# Patient Record
Sex: Female | Born: 1940 | Race: Black or African American | Hispanic: No | Smoking: Former smoker
Health system: Southern US, Community
[De-identification: ages and names within clinical notes are randomized; demographics above are authoritative.]

## PROBLEM LIST (undated history)

## (undated) DIAGNOSIS — I10 Essential (primary) hypertension: Secondary | ICD-10-CM

## (undated) DIAGNOSIS — M48061 Spinal stenosis, lumbar region without neurogenic claudication: Secondary | ICD-10-CM

## (undated) DIAGNOSIS — G5603 Carpal tunnel syndrome, bilateral upper limbs: Secondary | ICD-10-CM

## (undated) DIAGNOSIS — K56609 Unspecified intestinal obstruction, unspecified as to partial versus complete obstruction: Secondary | ICD-10-CM

## (undated) DIAGNOSIS — Q613 Polycystic kidney, unspecified: Secondary | ICD-10-CM

## (undated) DIAGNOSIS — Z9889 Other specified postprocedural states: Secondary | ICD-10-CM

## (undated) DIAGNOSIS — A6000 Herpesviral infection of urogenital system, unspecified: Secondary | ICD-10-CM

## (undated) DIAGNOSIS — M8580 Other specified disorders of bone density and structure, unspecified site: Secondary | ICD-10-CM

## (undated) DIAGNOSIS — I6529 Occlusion and stenosis of unspecified carotid artery: Secondary | ICD-10-CM

## (undated) DIAGNOSIS — R112 Nausea with vomiting, unspecified: Secondary | ICD-10-CM

## (undated) HISTORY — DX: Herpesviral infection of urogenital system, unspecified: A60.00

## (undated) HISTORY — DX: Unspecified intestinal obstruction, unspecified as to partial versus complete obstruction: K56.609

## (undated) HISTORY — PX: SPINE SURGERY: SHX786

## (undated) HISTORY — PX: APPENDECTOMY: SHX54

## (undated) HISTORY — PX: CARPAL TUNNEL RELEASE: SHX101

## (undated) HISTORY — DX: Occlusion and stenosis of unspecified carotid artery: I65.29

## (undated) HISTORY — DX: Carpal tunnel syndrome, bilateral upper limbs: G56.03

## (undated) HISTORY — DX: Polycystic kidney, unspecified: Q61.3

## (undated) HISTORY — DX: Essential (primary) hypertension: I10

## (undated) HISTORY — DX: Other specified disorders of bone density and structure, unspecified site: M85.80

## (undated) HISTORY — PX: BREAST SURGERY: SHX581

## (undated) HISTORY — PX: SMALL INTESTINE SURGERY: SHX150

## (undated) HISTORY — DX: Spinal stenosis, lumbar region without neurogenic claudication: M48.061

---

## 2005-10-15 LAB — HM COLONOSCOPY: HM Colonoscopy: NORMAL

## 2008-05-17 LAB — CONVERTED CEMR LAB: Pap Smear: NORMAL

## 2008-08-24 ENCOUNTER — Ambulatory Visit: Payer: Self-pay | Admitting: *Deleted

## 2008-08-24 DIAGNOSIS — G5600 Carpal tunnel syndrome, unspecified upper limb: Secondary | ICD-10-CM | POA: Insufficient documentation

## 2008-08-24 DIAGNOSIS — I10 Essential (primary) hypertension: Secondary | ICD-10-CM

## 2008-08-24 LAB — CONVERTED CEMR LAB
CO2: 29 meq/L (ref 19–32)
Chloride: 105 meq/L (ref 96–112)
GFR calc non Af Amer: 66 mL/min
Potassium: 4.6 meq/L (ref 3.5–5.1)
Sodium: 141 meq/L (ref 135–145)

## 2008-09-25 ENCOUNTER — Ambulatory Visit: Payer: Self-pay | Admitting: *Deleted

## 2008-09-25 DIAGNOSIS — E669 Obesity, unspecified: Secondary | ICD-10-CM

## 2008-09-25 DIAGNOSIS — E119 Type 2 diabetes mellitus without complications: Secondary | ICD-10-CM

## 2008-09-26 ENCOUNTER — Telehealth (INDEPENDENT_AMBULATORY_CARE_PROVIDER_SITE_OTHER): Payer: Self-pay | Admitting: *Deleted

## 2008-09-27 ENCOUNTER — Ambulatory Visit: Payer: Self-pay | Admitting: *Deleted

## 2008-09-28 ENCOUNTER — Telehealth (INDEPENDENT_AMBULATORY_CARE_PROVIDER_SITE_OTHER): Payer: Self-pay | Admitting: *Deleted

## 2008-10-02 ENCOUNTER — Telehealth (INDEPENDENT_AMBULATORY_CARE_PROVIDER_SITE_OTHER): Payer: Self-pay | Admitting: *Deleted

## 2008-10-04 ENCOUNTER — Ambulatory Visit: Payer: Self-pay | Admitting: *Deleted

## 2008-10-11 ENCOUNTER — Encounter (INDEPENDENT_AMBULATORY_CARE_PROVIDER_SITE_OTHER): Payer: Self-pay | Admitting: *Deleted

## 2008-10-16 ENCOUNTER — Encounter: Admission: RE | Admit: 2008-10-16 | Discharge: 2008-10-16 | Payer: Self-pay | Admitting: *Deleted

## 2008-10-18 ENCOUNTER — Encounter (INDEPENDENT_AMBULATORY_CARE_PROVIDER_SITE_OTHER): Payer: Self-pay | Admitting: *Deleted

## 2008-10-23 ENCOUNTER — Ambulatory Visit: Payer: Self-pay | Admitting: *Deleted

## 2008-10-26 ENCOUNTER — Encounter (INDEPENDENT_AMBULATORY_CARE_PROVIDER_SITE_OTHER): Payer: Self-pay | Admitting: *Deleted

## 2008-12-19 ENCOUNTER — Telehealth: Payer: Self-pay | Admitting: Internal Medicine

## 2008-12-19 ENCOUNTER — Ambulatory Visit: Payer: Self-pay | Admitting: Internal Medicine

## 2008-12-19 LAB — CONVERTED CEMR LAB
Albumin: 4 g/dL (ref 3.5–5.2)
Cholesterol: 200 mg/dL (ref 0–200)
Creatinine,U: 220.5 mg/dL
LDL Cholesterol: 122 mg/dL — ABNORMAL HIGH (ref 0–99)
Microalb Creat Ratio: 10 mg/g (ref 0.0–30.0)
Microalb, Ur: 2.2 mg/dL — ABNORMAL HIGH (ref 0.0–1.9)
TSH: 1.17 microintl units/mL (ref 0.35–5.50)
Total Bilirubin: 0.8 mg/dL (ref 0.3–1.2)
Triglycerides: 118 mg/dL (ref 0.0–149.0)
VLDL: 23.6 mg/dL (ref 0.0–40.0)

## 2008-12-20 ENCOUNTER — Encounter: Payer: Self-pay | Admitting: Internal Medicine

## 2008-12-24 ENCOUNTER — Telehealth: Payer: Self-pay | Admitting: Internal Medicine

## 2008-12-26 ENCOUNTER — Encounter: Payer: Self-pay | Admitting: Internal Medicine

## 2009-01-02 ENCOUNTER — Encounter: Payer: Self-pay | Admitting: Internal Medicine

## 2009-01-30 ENCOUNTER — Encounter: Payer: Self-pay | Admitting: Internal Medicine

## 2009-05-21 ENCOUNTER — Telehealth: Payer: Self-pay | Admitting: Internal Medicine

## 2009-05-22 ENCOUNTER — Ambulatory Visit: Payer: Self-pay | Admitting: Internal Medicine

## 2009-05-22 LAB — CONVERTED CEMR LAB
BUN: 30 mg/dL — ABNORMAL HIGH (ref 6–23)
Chloride: 101 meq/L (ref 96–112)
Cholesterol: 176 mg/dL (ref 0–200)
Glucose, Bld: 160 mg/dL — ABNORMAL HIGH (ref 70–99)
LDL Cholesterol: 99 mg/dL (ref 0–99)
Potassium: 4.7 meq/L (ref 3.5–5.3)
Sodium: 136 meq/L (ref 135–145)
Total CHOL/HDL Ratio: 3.1
Triglycerides: 105 mg/dL (ref <150)
VLDL: 21 mg/dL (ref 0–40)

## 2009-05-22 LAB — HM DIABETES FOOT EXAM

## 2009-05-23 ENCOUNTER — Telehealth: Payer: Self-pay | Admitting: Internal Medicine

## 2009-05-31 ENCOUNTER — Ambulatory Visit: Payer: Self-pay | Admitting: Internal Medicine

## 2009-05-31 LAB — CONVERTED CEMR LAB
BUN: 31 mg/dL — ABNORMAL HIGH (ref 6–23)
Calcium: 9.5 mg/dL (ref 8.4–10.5)
Glucose, Bld: 131 mg/dL — ABNORMAL HIGH (ref 70–99)
Potassium: 4.3 meq/L (ref 3.5–5.3)

## 2009-06-01 ENCOUNTER — Telehealth: Payer: Self-pay | Admitting: Internal Medicine

## 2009-06-26 ENCOUNTER — Ambulatory Visit (HOSPITAL_BASED_OUTPATIENT_CLINIC_OR_DEPARTMENT_OTHER): Admission: RE | Admit: 2009-06-26 | Discharge: 2009-06-26 | Payer: Self-pay | Admitting: Internal Medicine

## 2009-06-26 ENCOUNTER — Ambulatory Visit: Payer: Self-pay | Admitting: Diagnostic Radiology

## 2009-06-26 ENCOUNTER — Ambulatory Visit: Payer: Self-pay | Admitting: Internal Medicine

## 2009-06-26 DIAGNOSIS — N182 Chronic kidney disease, stage 2 (mild): Secondary | ICD-10-CM | POA: Insufficient documentation

## 2009-07-25 ENCOUNTER — Encounter: Payer: Self-pay | Admitting: Internal Medicine

## 2009-08-16 ENCOUNTER — Ambulatory Visit: Payer: Self-pay | Admitting: Internal Medicine

## 2009-08-16 DIAGNOSIS — Z87891 Personal history of nicotine dependence: Secondary | ICD-10-CM

## 2009-08-16 DIAGNOSIS — E785 Hyperlipidemia, unspecified: Secondary | ICD-10-CM

## 2009-08-27 ENCOUNTER — Ambulatory Visit: Payer: Self-pay

## 2009-08-27 ENCOUNTER — Encounter: Payer: Self-pay | Admitting: Cardiovascular Disease

## 2009-08-27 ENCOUNTER — Encounter: Payer: Self-pay | Admitting: Internal Medicine

## 2009-10-08 ENCOUNTER — Telehealth: Payer: Self-pay | Admitting: Internal Medicine

## 2009-10-11 ENCOUNTER — Ambulatory Visit: Payer: Self-pay | Admitting: Internal Medicine

## 2009-10-11 DIAGNOSIS — Q613 Polycystic kidney, unspecified: Secondary | ICD-10-CM

## 2009-10-11 DIAGNOSIS — J209 Acute bronchitis, unspecified: Secondary | ICD-10-CM

## 2009-10-11 HISTORY — DX: Polycystic kidney, unspecified: Q61.3

## 2009-10-11 LAB — CONVERTED CEMR LAB
Inflenza A Ag: NEGATIVE
Influenza B Ag: NEGATIVE

## 2009-10-19 ENCOUNTER — Ambulatory Visit: Payer: Self-pay | Admitting: Internal Medicine

## 2009-11-21 ENCOUNTER — Telehealth: Payer: Self-pay | Admitting: Internal Medicine

## 2010-01-01 ENCOUNTER — Encounter: Payer: Self-pay | Admitting: Internal Medicine

## 2010-01-08 ENCOUNTER — Encounter: Payer: Self-pay | Admitting: Internal Medicine

## 2010-01-08 ENCOUNTER — Ambulatory Visit (HOSPITAL_COMMUNITY): Admission: RE | Admit: 2010-01-08 | Discharge: 2010-01-08 | Payer: Self-pay | Admitting: Nephrology

## 2010-02-06 ENCOUNTER — Encounter: Payer: Self-pay | Admitting: Internal Medicine

## 2010-02-06 LAB — HM DIABETES EYE EXAM: HM Diabetic Eye Exam: NORMAL

## 2010-02-15 ENCOUNTER — Encounter: Payer: Self-pay | Admitting: Internal Medicine

## 2010-02-20 ENCOUNTER — Encounter: Admission: RE | Admit: 2010-02-20 | Discharge: 2010-02-20 | Payer: Self-pay | Admitting: Nephrology

## 2010-03-05 ENCOUNTER — Ambulatory Visit: Payer: Self-pay | Admitting: Internal Medicine

## 2010-03-05 LAB — CONVERTED CEMR LAB
BUN: 42 mg/dL — ABNORMAL HIGH (ref 6–23)
CO2: 23 meq/L (ref 19–32)
Calcium: 9.5 mg/dL (ref 8.4–10.5)
Chloride: 106 meq/L (ref 96–112)
Creatinine, Ser: 1.55 mg/dL — ABNORMAL HIGH (ref 0.40–1.20)
Glucose, Bld: 113 mg/dL — ABNORMAL HIGH (ref 70–99)
Microalb Creat Ratio: 7.7 mg/g (ref 0.0–30.0)

## 2010-03-06 ENCOUNTER — Telehealth: Payer: Self-pay | Admitting: Internal Medicine

## 2010-03-07 ENCOUNTER — Encounter (INDEPENDENT_AMBULATORY_CARE_PROVIDER_SITE_OTHER): Payer: Self-pay | Admitting: *Deleted

## 2010-03-22 ENCOUNTER — Encounter: Payer: Self-pay | Admitting: Internal Medicine

## 2010-04-03 ENCOUNTER — Encounter: Payer: Self-pay | Admitting: Internal Medicine

## 2010-04-04 ENCOUNTER — Telehealth: Payer: Self-pay | Admitting: Internal Medicine

## 2010-05-08 ENCOUNTER — Other Ambulatory Visit: Admission: RE | Admit: 2010-05-08 | Discharge: 2010-05-08 | Payer: Self-pay | Admitting: Internal Medicine

## 2010-05-08 ENCOUNTER — Ambulatory Visit: Payer: Self-pay | Admitting: Family

## 2010-05-09 ENCOUNTER — Encounter: Payer: Self-pay | Admitting: Internal Medicine

## 2010-05-10 ENCOUNTER — Telehealth: Payer: Self-pay | Admitting: Internal Medicine

## 2010-05-10 ENCOUNTER — Encounter: Payer: Self-pay | Admitting: Family

## 2010-05-14 ENCOUNTER — Encounter: Payer: Self-pay | Admitting: Internal Medicine

## 2010-06-10 ENCOUNTER — Encounter: Payer: Self-pay | Admitting: Internal Medicine

## 2010-06-10 LAB — CONVERTED CEMR LAB
ALT: 17 units/L (ref 0–35)
AST: 20 units/L (ref 0–37)
Albumin: 4.2 g/dL (ref 3.5–5.2)
Alkaline Phosphatase: 60 units/L (ref 39–117)
BUN: 35 mg/dL — ABNORMAL HIGH (ref 6–23)
Bilirubin, Direct: 0.1 mg/dL (ref 0.0–0.3)
CO2: 23 meq/L (ref 19–32)
Calcium: 9.3 mg/dL (ref 8.4–10.5)
Chloride: 106 meq/L (ref 96–112)
Cholesterol: 174 mg/dL (ref 0–200)
Creatinine, Ser: 1.38 mg/dL — ABNORMAL HIGH (ref 0.40–1.20)
Glucose, Bld: 97 mg/dL (ref 70–99)
HDL: 72 mg/dL (ref >39)
Hgb A1c MFr Bld: 6.1 % — ABNORMAL HIGH (ref <5.7)
Indirect Bilirubin: 0.4 mg/dL (ref 0.0–0.9)
LDL Cholesterol: 85 mg/dL (ref 0–99)
Potassium: 4.5 meq/L (ref 3.5–5.3)
Sodium: 140 meq/L (ref 135–145)
Total Bilirubin: 0.5 mg/dL (ref 0.3–1.2)
Total CHOL/HDL Ratio: 2.4
Total Protein: 7.4 g/dL (ref 6.0–8.3)
Triglycerides: 86 mg/dL (ref <150)
VLDL: 17 mg/dL (ref 0–40)

## 2010-06-18 ENCOUNTER — Ambulatory Visit: Payer: Self-pay | Admitting: Internal Medicine

## 2010-06-20 ENCOUNTER — Ambulatory Visit (HOSPITAL_COMMUNITY): Admission: RE | Admit: 2010-06-20 | Discharge: 2010-06-20 | Payer: Self-pay | Admitting: Orthopaedic Surgery

## 2010-06-27 ENCOUNTER — Ambulatory Visit: Payer: Self-pay | Admitting: Diagnostic Radiology

## 2010-06-27 ENCOUNTER — Ambulatory Visit (HOSPITAL_BASED_OUTPATIENT_CLINIC_OR_DEPARTMENT_OTHER): Admission: RE | Admit: 2010-06-27 | Discharge: 2010-06-27 | Payer: Self-pay | Admitting: Internal Medicine

## 2010-09-17 NOTE — Letter (Signed)
   [place] at [facility] [address] [place], [place]  [address]  [place] Phone: [phone]      May 10, 2010   [place] [address] [place], [place] [address]  RE:  LAB RESULTS  Dear  Ms. Searles,  The following is an interpretation of your most recent lab tests.  Please take note of any instructions provided or changes to medications that have resulted from your lab work.  Pap Smear: normal      Sincerely Yours,    Lemont Fillers FNP  Appended Document:  mailed

## 2010-09-17 NOTE — Assessment & Plan Note (Signed)
Summary: 4 month follow up/mhjf   Vital Signs:  Patient profile:   70 year old female Height:      59 inches Weight:      125.25 pounds BMI:     25.39 O2 Sat:      100 % on Room air Temp:     97.7 degrees F oral Pulse rate:   71 / minute Pulse rhythm:   regular Resp:     18 per minute BP sitting:   140 / 80  (right arm) Cuff size:   regular  Vitals Entered By: Glendell Docker CMA (June 18, 2010 10:19 AM)  O2 Flow:  Room air  Contraindications/Deferment of Procedures/Staging:    Test/Procedure: FLU VAX    Reason for deferment: patient declined  CC: 4 month follow  Is Patient Diabetic? Yes Did you bring your meter with you today? No Pain Assessment Patient in pain? no      Comments low blood sugar 107 high 115 avg  107-109   Primary Care Mon[place]:  Dondra Spry DO  CC:  4 month follow .  History of Present Illness: 70 y/o AA female for f/u  htn - no dizziness,  no chest pain,  good med compliance DM II - stable  CTS - seen by Dr. Clarita Crane.   NCS confirmed CTS.  Rt hand worse than left. Ins co has not approved surgery    Preventive Screening-Counseling & Management  Alcohol-Tobacco     Smoking Status: quit  Allergies: 1)  ! Ibuprofen 2)  ! 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   Neck:  No deformities, masses, or tenderness noted. Lungs:  Normal respiratory effort, chest expands symmetrically. Lungs are clear to auscultation, no crackles  or wheezes. Heart:  Normal rate and regular rhythm. S1 and S2 normal without gallop, murmur, click, rub or other extra sounds. Extremities:  No lower extremity edema    Impression & Recommendations:  Problem # 1:  DIABETES MELLITUS, TYPE II (ICD-250.00) Assessment Unchanged  Her updated medication list for this problem includes:    Losartan Potassium 50 Mg Tabs (Losartan potassium) ..... One by mouth once daily  Labs Reviewed: Creat: 1.38 (06/10/2010)     Last Eye Exam: normal (02/06/2010) Reviewed HgBA1c results: 6.1 (06/10/2010)  6.3 (03/05/2010)  Problem # 2:  HYPERTENSION, BENIGN ESSENTIAL (ICD-401.1) Assessment: Unchanged bp suboptimal.  add amlodipine  Her updated medication list for this problem includes:    Losartan Potassium 50 Mg Tabs (Losartan potassium) ..... One by mouth once daily    Amlodipine Besylate 2.5 Mg Tabs (Amlodipine besylate) ..... One by mouth once daily  BP today: 140/80 Prior BP: 142/94 (05/08/2010)  Labs Reviewed: K+: 4.5 (06/10/2010) Creat: : 1.38 (06/10/2010)   Chol: 174 (06/10/2010)   HDL: 72 (06/10/2010)   LDL: 85 (06/10/2010)   TG: 86 (06/10/2010)  Problem # 3:  HYPERLIPIDEMIA (ICD-272.4) Assessment:  Unchanged  Her updated medication list for this problem includes:    Pravastatin Sodium 40 Mg Tabs (Pravastatin sodium) ..... One by mouth at bedtime  Labs Reviewed: SGOT: 20 (06/10/2010)   SGPT: 17 (06/10/2010)   HDL:72 (06/10/2010), 56 (05/22/2009)  LDL:85 (06/10/2010), 99 (05/22/2009)  Chol:174 (06/10/2010), 176 (05/22/2009)  Trig:86 (06/10/2010), 105 (05/22/2009)  Complete Medication List: 1)  Losartan Potassium 50 Mg Tabs (Losartan potassium) .... One by mouth once daily 2)  Pravastatin Sodium 40 Mg Tabs (Pravastatin sodium) .... One by mouth at bedtime 3)  Nystop 100000 Unit/gm Powd (Nystatin) .... Apply twice daily beneath breasts until rash is resolved 4)  Amlodipine Besylate 2.5 Mg Tabs (Amlodipine besylate) .... One by mouth  once daily  Patient Instructions: 1)  Please schedule a follow-up appointment in 2 months. Prescriptions: PRAVASTATIN SODIUM 40 MG TABS (PRAVASTATIN SODIUM) one by mouth at bedtime  #90 x 1   Entered and Authorized by:   D. Thomos Lemons DO   Signed by:   D. Thomos Lemons DO on 06/18/2010   Method used:   Electronically to        [facility] [address] (retail)       [phone]       [place], [place]  [address]       Ph: [phone]       Fax: [phone]   RxID:   [id number] LOSARTAN POTASSIUM 50 MG TABS (LOSARTAN POTASSIUM) one by mouth once daily  #90 x 1   Entered and Authorized by:   D. Thomos Lemons DO   Signed by:   D. Thomos Lemons DO on 06/18/2010   Method used:   Electronically to        [facility] [address] [place] (retail)       [phone]       [place], [place]  [address]       Ph: [phone]       Fax: [phone]   RxID:   [id number] AMLODIPINE BESYLATE 2.5 MG TABS (AMLODIPINE BESYLATE) one by mouth once daily  #30 x 3   Entered and Authorized by:   D. Thomos Lemons DO   Signed by:   D. Thomos Lemons DO on 06/18/2010   Method used:   Electronically to        [facility] [address] [place] (retail)       [phone]       [place], [place]  [address]       Ph: [phone]       Fax: [phone]   RxID:   [id number]    Orders Added: 1)  Est. Patient Level III [03500]   Immunization History:  Influenza Immunization History:    Influenza:  declined (06/18/2010)   Immunization History:  Influenza Immunization History:    Influenza:  Declined (06/18/2010)  Current Allergies (reviewed today): ! IBUPROFEN ! LISINOPRIL

## 2010-09-17 NOTE — Consult Note (Signed)
Summary: [facility]  [facility]   Imported By: Lanelle Bal 04/12/2010 10:27:19  _____________________________________________________________________  External Attachment:    Type:   Image     Comment:   External Document

## 2010-09-17 NOTE — Letter (Signed)
Summary: Primary Care Consult Scheduled Letter  [place] at [facility]  [address]   [place], [place] [address]   Phone: [phone]  Fax: [phone]      03/07/2010 MRN: [id number]  [facility] [address] [place], [place]  [address]    Dear Ms. Kapral,      We have scheduled an appointment for you.  At the recommendation of Dr.YOO, we have scheduled you a consult with [facility] , DR Melvyn Novas  on AUGUST 9,2011 at 2:30PM .  Their address is_[address] . The office phone number is [phone].  If this appointment day and time is not convenient for you, please feel free to call the office of the doctor you are being referred to at the number listed above and reschedule the appointment.     It is important for you to keep your scheduled appointments. We are here to make sure you are given good patient care.     Thank you,  Darral Dash Patient Care Coordinator [place] at [facility]

## 2010-09-17 NOTE — Progress Notes (Signed)
Summary: Nerve conductive study  Phone Note Call from Patient Call back at Home Phone [phone] Call back at [phone]   Caller: Patient Summary of Call: Pt wants to know if we know of a physician that can do a nerve conductive study that accepts her insurance ([employer]), Dr Jenetta Downer office recommended a office in [place] that does not accept her insurance Initial call taken by: Lannette Donath,  April 04, 2010 10:13 AM  Follow-up for Phone Call        Hold to schedule  for Ins  precert    Pt is aware  Follow-up by: Darral Dash,  April 15, 2010 11:18 AM

## 2010-09-17 NOTE — Assessment & Plan Note (Signed)
Summary: pap/mhf--Rm 5   Vital Signs:  Patient profile:   70 year old female LMP:     08/18/1989 Height:      59 inches Weight:      121.50 pounds BMI:     24.63 Temp:     98.1 degrees F oral Pulse rate:   90 / minute Pulse rhythm:   regular Resp:     16 per minute BP sitting:   142 / 94  (right arm) Cuff size:   regular  Vitals Entered By: Mervin Kung CMA Duncan Dull) (May 08, 2010 2:28 PM) CC: Rm 5  Pt here for pap smear. Is Patient Diabetic? Yes Comments Pt states she has been out of Pravastatin for some time and needs refill. Nicki Guadalajara Fergerson CMA Duncan Dull)  May 08, 2010 2:32 PM  LMP (date): 08/18/1989 LMP - Character: menopausal Menarche (age onset years): 12   Menses interval (days): 28 Menstrual flow (days): 4 Enter LMP: 08/18/1989 Last PAP Result Normal   Primary Care Provider:  Dondra Spry DO  CC:  Rm 5  Pt here for pap smear.Marland Kitchen  History of Present Illness: Ms Muntean is 70 year old female who presents today for her screening Pap.  She reports that she has always had normal Pap smears.  Last Pap was in 2009.  Her last mammogram was performed in November 2010.  Pt reports 3 preganancies, 3 live term births.  Currently menopausal.  Not currently sexually active.  Has been menopausal since 1991.  Allergies: 1)  ! Ibuprofen 2)  ! Lisinopril  Past History:  Past Medical History: Last updated: 03/05/2010 carpal tunnel Hypertension  lumbar spinal stenosis      Diabetes mellitus, type II     Physical Exam  General:  Petite, african [place] female, awake, alert and in NAD Head:  Normocephalic and atraumatic without obvious abnormalities. No apparent alopecia or balding. Neck:  No deformities, masses, or tenderness noted. Breasts:  Dense breasts, but no discrete masses noted.  Lungs:  Normal respiratory effort, chest expands symmetrically. Lungs are clear to auscultation, no crackles or wheezes. Heart:  Normal rate and regular rhythm. S1 and S2 normal  without gallop, murmur, click, rub or other extra sounds. Abdomen:  Bowel sounds positive,abdomen soft and non-tender without masses, organomegaly or hernias noted. Genitalia:  Pelvic Exam:        External: normal female genitalia without lesions or masses        Vagina: normal without lesions or masses        Cervix: normal without lesions or masses        Adnexa: normal bimanual exam without masses or fullness        Uterus: normal by palpation        Pap smear: performed   Impression & Recommendations:  Problem # 1:  ROUTINE GYNECOLOGICAL EXAMINATION (ICD-V72.31) Assessment Comment Only Pap performed today.  Will need mammogram in November- wants to wait to schedule.  Patient has a follow up appointment with Dr. Artist Pais in November.    Complete Medication List: 1)  Cozaar 50 Mg Tabs (Losartan potassium) .... One by mouth qd 2)  Pravastatin Sodium 40 Mg Tabs (Pravastatin sodium) .... One by mouth at bedtime  Patient Instructions: 1)  Please go to lab fasting 1 week before your upcoming appointment with Dr. Artist Pais to complete your lab work. 2)  We will mail you the results of your Pap smear. 3)  You will need to schedule a mammogram in November-  please call us when you are ready to schedule this.   Current Allergies (reviewed today): ! IBUPROFEN ! LISINOPRIL

## 2010-09-17 NOTE — Assessment & Plan Note (Signed)
Summary: check hand/mhf   Vital Signs:  Patient profile:   70 year old female Height:      59 inches Weight:      122.25 pounds BMI:     24.78 O2 Sat:      100 % on Room air Temp:     98.2 degrees F oral Pulse rate:   91 / minute Pulse rhythm:   regular Resp:     18 per minute BP sitting:   132 / 80  (right arm) Cuff size:   regular  Vitals Entered By: Glendell Docker CMA (March 05, 2010 3:06 PM)  O2 Flow:  Room air CC: Rm 3- Hand evaluation Is Patient Diabetic? Yes Comments c/ o bilateral tingling in hands and arms, low blood sugar 108 high 115   Primary Care Provider:  Dondra Spry DO  CC:  Rm 3- Hand evaluation.  History of Present Illness: 70 y/o female  c/o constant left hand numbness and tingling.  no improvement with using wrist splint.  remote CTS surgery in [place].      Preventive Screening-Counseling & Management  Alcohol-Tobacco     Smoking Status: quit  Allergies: 1)  ! Ibuprofen 2)  ! Lisinopril  Past History:  Past Medical History: carpal tunnel Hypertension  lumbar spinal stenosis      Diabetes mellitus, type II     Physical Exam  General:  alert, well-developed, and well-nourished.   Lungs:  normal respiratory effort and normal breath sounds.   Heart:  normal rate, regular rhythm, and no gallop.   Msk:  wrists - normal ROM, no joint tenderness, no joint deformities, and no joint instability.     Impression & Recommendations:  Problem # 1:  HYPERTENSION, BENIGN ESSENTIAL (ICD-401.1)  Her updated medication list for this problem includes:    Cozaar 50 Mg Tabs (Losartan potassium) ..... One by mouth qd  Orders: T-Basic Metabolic Panel [id number])  BP today: 132/80 Prior BP: 126/90 (10/19/2009)  Labs Reviewed: K+: 4.3 (05/31/2009) Creat: : 1.35 (05/31/2009)   Chol: 176 (05/22/2009)   HDL: 56 (05/22/2009)   LDL: 99 (05/22/2009)   TG: 105 (05/22/2009)  Problem # 2:  DIABETES MELLITUS, TYPE II (ICD-250.00)  Her updated medication  list for this problem includes:    Cozaar 50 Mg Tabs (Losartan potassium) ..... One by mouth qd  Orders: T- Hemoglobin A1C ([id number]) T-Urine Microalbumin w/creat. ratio [id number])  Labs Reviewed: Creat: 1.35 (05/31/2009)     Last Eye Exam: normal (02/06/2010) Reviewed HgBA1c results: 6.3 (05/22/2009)  Problem # 3:  CARPAL TUNNEL SYNDROME (ICD-354.0) chronic left hand numbness and tingling.  prev CTS.  refer to Dr. Teressa Senter for further eval and tx  Orders: Orthopedic Referral (Ortho)  Complete Medication List: 1)  Cozaar 50 Mg Tabs (Losartan potassium) .... One by mouth qd 2)  Pravastatin Sodium 40 Mg Tabs (Pravastatin sodium) .... One by mouth at bedtime  Patient Instructions: 1)  Please schedule a follow-up appointment in 4 months. 2)  BMP prior to visit, ICD-9:  401.9 3)  Hepatic Panel prior to visit, ICD-9: 272.4 4)  Lipid Panel prior to visit, ICD-9: 272.4 5)  HbgA1C prior to visit, ICD-9: 250.00 6)  Please return for lab work one (1) week before your next appointment.   Current Allergies (reviewed today): ! IBUPROFEN ! LISINOPRIL

## 2010-09-17 NOTE — Progress Notes (Signed)
Summary: Nephro appt chg'd  Phone Note From Other Clinic Call back at [phone]   Caller: Appointment Secretary Summary of Call: Meriam Sprague from Dr Elza Rafter office called and said pt has a family emergency and will not be back in town until mid-May, she has resch her appt with Dr Elza Rafter office for 5.17.11. She was offerred 5.2.11 and 5.6.11 but pt said she wouldn't be back yet. Pt's out of town # is [phone] per [place]. Initial call taken by: Lannette Donath,  November 21, 2009 11:08 AM  Follow-up for Phone Call        noted Follow-up by: D. Thomos Lemons DO,  November 21, 2009 12:15 PM

## 2010-09-17 NOTE — Medication Information (Signed)
Summary: Glucose Monitor/[facility]  Glucose Monitor/[facility]   Imported By: Lanelle Bal 02/22/2010 08:27:18  _____________________________________________________________________  External Attachment:    Type:   Image     Comment:   External Document

## 2010-09-17 NOTE — Progress Notes (Signed)
Summary: Rx for rash & cholestrol  Phone Note Call from Patient Call back at Home Phone [phone]   Caller: Patient Reason for Call: Talk to Nurse Summary of Call: Pt needs Rx for cream for rash & cholestrol meds Initial call taken by: Lannette Donath,  May 10, 2010 12:07 PM  Follow-up for Phone Call        call returned to patient at [phone], she states at the time she had  her pap smear a cream was to have been prescribed for the rash under her breast, and she is also requesting a rx refill for Pravastatin. Follow-up by: Glendell Docker CMA,  May 10, 2010 1:11 PM  Additional Follow-up for Phone Call Additional follow up Details #1::        I recommended nystatin powder and have sent this to her pharmacy. Additional Follow-up by: Lemont Fillers FNP,  May 10, 2010 1:35 PM    New/Updated Medications: NYSTOP 100000 UNIT/GM POWD (NYSTATIN) apply twice daily beneath breasts until rash is resolved Prescriptions: NYSTOP 100000 UNIT/GM POWD (NYSTATIN) apply twice daily beneath breasts until rash is resolved  #1 x 1   Entered and Authorized by:   Lemont Fillers FNP   Signed by:   Lemont Fillers FNP on 05/10/2010   Method used:   Electronically to        [facility] [address] (retail)       [phone]       [place], [place]  [address]       Ph: [phone]       Fax: [phone]   RxID:   [id number] PRAVASTATIN SODIUM 40 MG TABS (PRAVASTATIN SODIUM) one by mouth at bedtime  #30 x 5   Entered by:   Glendell Docker CMA   Authorized by:   D. Thomos Lemons DO   Signed by:   Lemont Fillers FNP on 05/10/2010   Method used:   Electronically to        [facility] [address] [place] (retail)       [phone]       [place], [place]  [address]       Ph: [phone]       Fax: [phone]   RxID:   [id number]

## 2010-09-17 NOTE — Progress Notes (Signed)
Summary: Amlodipine Refill  Phone Note Refill Request Message from:  Fax from Pharmacy on October 08, 2009 11:53 AM  Refills Requested: Medication #1:  AMLODIPINE BESYLATE 5 MG TABS one by mouth once daily   Dosage confirmed as above?Dosage Confirmed   Brand Name Necessary? No   Supply Requested: 1 month   Last Refilled: 08/30/2009  Method Requested: Electronic Next Appointment Scheduled: none Initial call taken by: Roselle Locus,  October 08, 2009 11:53 AM  Follow-up for Phone Call        Rx completed in Dr. Tiajuana Amass Follow-up by: Glendell Docker CMA,  October 08, 2009 5:45 PM    Prescriptions: AMLODIPINE BESYLATE 5 MG TABS (AMLODIPINE BESYLATE) one by mouth once daily  #30 x 2   Entered by:   Glendell Docker CMA   Authorized by:   D. Thomos Lemons DO   Signed by:   Glendell Docker CMA on 10/08/2009   Method used:   Electronically to        [facility] [address] [place] (retail)       [phone]       [place], [place]  [address]       Ph: [phone]       Fax: [phone]   RxID:   [id number]

## 2010-09-17 NOTE — Miscellaneous (Signed)
Summary: Future Lab Orders  Clinical Lists Changes  Orders: Added new Test order of T-Basic Metabolic Panel [id number]) - Signed Added new Test order of T-Hepatic Function [phone]) - Signed Added new Test order of T-Lipid Profile [phone]) - Signed Added new Test order of T- Hemoglobin A1C ([id number]) - Signed

## 2010-09-17 NOTE — Letter (Signed)
Summary: [facility]   Imported By: Lanelle Bal 02/13/2010 07:56:07  _____________________________________________________________________  External Attachment:    Type:   Image     Comment:   External Document

## 2010-09-17 NOTE — Assessment & Plan Note (Signed)
Summary: cold 8:45/mhf   Vital Signs:  Patient profile:   71 year old female Weight:      120.50 pounds BMI:     24.43 O2 Sat:      100 % on Room air Temp:     98.4 degrees F oral Pulse rate:   99 / minute Pulse rhythm:   regular Resp:     20 per minute BP sitting:   80 / 50  (right arm) Cuff size:   regular  Vitals Entered By: Glendell Docker CMA (October 11, 2009 8:55 AM)  O2 Flow:  Room air CC: Chest Congestion Is Patient Diabetic? Yes Comments c/ o chest congetsion, cough,denies headache   Primary Care Provider:  Dondra Spry DO  CC:  Chest Congestion.  History of Present Illness: 70 y/o AA female c/o mucus in the throat, can't get it up.   symptoms started on Monday.  mild SOB,  no fever, feels achey  elevated Cr. - renal artery doppler reviewed no family hx of polycystic kidney dz   Preventive Screening-Counseling & Management  Alcohol-Tobacco     Smoking Status: quit  Allergies: 1)  ! Ibuprofen 2)  ! 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   Ears:  R ear normal and L ear normal.   Mouth:  pharynx pink and moist.   Neck:  mild lower neck tenderness Lungs:  normal respiratory effort, bilateral course breath sounds Heart:  normal rate, regular rhythm, and no gallop.     Impression & Recommendations:  Problem # 1:  ACUTE BRONCHITIS (ICD-466.0) 70 y/o with brochitis.  take abx as directed.  Patient advised to call office if symptoms persist or worsen.  Her updated medication list for this problem includes:    Cefuroxime  Axetil 500 Mg Tabs (Cefuroxime axetil) ..... One by mouth two times a day    Benzonatate 100 Mg Caps (Benzonatate) ..... One by mouth three times a day as needed cough  Orders: Flu A+B ([address])  Problem # 2:  HYPERTENSION, BENIGN ESSENTIAL (ICD-401.1) Hold BP meds x 2 days, then restart at 1/2 dose.  Pt advised to increase fluids.  The following medications were removed from the medication list:    Amlodipine Besylate 5 Mg Tabs (Amlodipine besylate) ..... One by mouth once daily Her updated medication list for this problem includes:    Cozaar 50 Mg Tabs (Losartan potassium) ..... One by mouth qd  BP today: 80/50 Prior BP: 120/80 (08/16/2009)  Labs Reviewed: K+: 4.3 (05/31/2009) Creat: : 1.35 (05/31/2009)   Chol: 176 (05/22/2009)   HDL: 56 (05/22/2009)   LDL: 99 (05/22/2009)   TG: 105 (05/22/2009)  Problem # 3:  POLYCYSTIC KIDNEY DISEASE (ICD-753.12) Kidney u/s showed:  smaller right kidney by 2.0 cm.  normal renal arteries bilaterally.  polycystic appearance of both kidneys. Renal u/s obtained due to rise in Cr.   Orders: Nephrology Referral (Nephro)  Complete Medication List: 1)  Metformin Hcl 1000 Mg Tabs (Metformin hcl) .... 1/2 by mouth two times a day 2)  Cozaar 50 Mg  Tabs (Losartan potassium) .... One by mouth qd 3)  Pravastatin Sodium 40 Mg Tabs (Pravastatin sodium) .... One by mouth at bedtime 4)  Cefuroxime Axetil 500 Mg Tabs (Cefuroxime axetil) .... One by mouth two times a day 5)  Benzonatate 100 Mg Caps (Benzonatate) .... One by mouth three times a day as needed cough  Patient Instructions: 1)  Hold your blood pressure medication for 2 days. 2)  Restart Cozaar as half dose. 3)  Stop amlodipine 4)  Increase fluid intake.  5)  Please schedule a follow-up appointment in 1 week. 6)  Call our office if your symptoms get worse. Prescriptions: BENZONATATE 100 MG CAPS (BENZONATATE) one by mouth three times a day as needed cough  #30 x 0   Entered and Authorized by:   D.  Thomos Lemons DO   Signed by:   D. Thomos Lemons DO on 10/11/2009   Method used:   Electronically to        [facility] [address] [place] (retail)       [phone]       [place], [place]  [address]       Ph: [phone]       Fax: [phone]   RxID:   [id number] CEFUROXIME AXETIL 500 MG TABS (CEFUROXIME AXETIL) one by mouth two times a day  #20 x 0   Entered and Authorized by:   D. Thomos Lemons DO   Signed by:   D. Thomos Lemons DO on 10/11/2009   Method used:   Electronically to        [facility] [address] [place] (retail)       [phone]       [place], [place]  [address]       Ph: [phone]       Fax: [phone]   RxID:   [id number]   Current Allergies (reviewed today): ! IBUPROFEN ! LISINOPRIL       Laboratory Results    Other Tests  Influenza A: negative Influenza B: negative

## 2010-09-17 NOTE — Letter (Signed)
Summary: [facility]  [facility]   Imported By: Lanelle Bal 05/27/2010 11:12:51  _____________________________________________________________________  External Attachment:    Type:   Image     Comment:   External Document

## 2010-09-17 NOTE — Assessment & Plan Note (Signed)
Summary: 1 week follow up/mhf   Vital Signs:  Patient profile:   70 year old female Weight:      124.25 pounds BMI:     25.19 O2 Sat:      97 % on Room air Temp:     97.9 degrees F oral Pulse rate:   81 / minute Pulse rhythm:   regular Resp:     16 per minute BP sitting:   126 / 90  (left arm) Cuff size:   regular  Vitals Entered By: Glendell Docker CMA (October 19, 2009 9:02 AM)  O2 Flow:  Room air CC: RM 3- 1 week follow up  Comments 1 week follow up- improved but still has some mucus build up in throat   Primary Care Provider:  Dondra Spry DO  CC:  RM 3- 1 week follow up .  History of Present Illness: 70 y/o AA female recently seen for bronchitis for f/u cough and sob much better still some mucus in her throat  Htn - BP much better.  no dizziness    Allergies: 1)  ! Ibuprofen 2)  ! 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   Lungs:  normal respiratory effort and normal breath sounds.   Heart:  normal rate, regular rhythm, and no gallop.   Extremities:  No lower extremity edema    Impression & Recommendations:  Problem # 1:  ACUTE BRONCHITIS (ICD-466.0) Assessment Improved Some mild residual cough.  The following medications were removed from the medication list:    Cefuroxime Axetil 500 Mg Tabs (Cefuroxime axetil) ..... One by mouth two times a day    Benzonatate 100 Mg Caps (Benzonatate) ..... One by mouth three times a day as needed cough  Problem # 2:  HYPERTENSION, BENIGN ESSENTIAL  (ICD-401.1) BP much better with cessation of amlodipine.  Her updated medication list for this problem includes:    Cozaar 50 Mg Tabs (Losartan potassium) ..... One by mouth qd  BP today: 126/90 Prior BP: 80/50 (10/11/2009)  Labs Reviewed: K+: 4.3 (05/31/2009) Creat: : 1.35 (05/31/2009)   Chol: 176 (05/22/2009)   HDL: 56 (05/22/2009)   LDL: 99 (05/22/2009)   TG: 105 (05/22/2009)  Problem # 3:  POLYCYSTIC KIDNEY DISEASE (ICD-753.12) Provided educational material re:  polycystic kidney dz.  awaiting nephrology referral.  Complete Medication List: 1)  Metformin Hcl 1000 Mg Tabs (Metformin hcl) .... 1/2 by mouth two times a day 2)  Cozaar 50 Mg Tabs (Losartan potassium) .... One by mouth qd 3)  Pravastatin Sodium 40 Mg Tabs (Pravastatin sodium) .... One by mouth at bedtime  Patient Instructions: 1)  Please schedule a follow-up appointment in 4 months.  Current Allergies (reviewed today): ! IBUPROFEN ! LISINOPRIL

## 2010-09-17 NOTE — Progress Notes (Signed)
Summary: Lab Results  Phone Note Outgoing Call   Summary of Call: call pt - kidney function slightly worse.  stop taking metformin Initial call taken by: D. Thomos Lemons DO,  March 06, 2010 9:02 AM  Follow-up for Phone Call        call  placed to patient, she was advised per Dr Artist Pais instructions Follow-up by: Glendell Docker CMA,  March 06, 2010 9:26 AM

## 2010-09-17 NOTE — Letter (Signed)
Summary: [facility]  [facility]   Imported By: Lanelle Bal 05/21/2010 12:23:15  _____________________________________________________________________  External Attachment:    Type:   Image     Comment:   External Document

## 2010-09-17 NOTE — Consult Note (Signed)
Summary: [facility]  [facility]   Imported By: Lanelle Bal 01/18/2010 08:05:29  _____________________________________________________________________  External Attachment:    Type:   Image     Comment:   External Document

## 2010-09-17 NOTE — Miscellaneous (Signed)
Summary: Orders Update  Clinical Lists Changes  Orders: Added new Test order of Renal Artery Duplex (Renal Artery Duplex) - Signed 

## 2010-09-17 NOTE — Miscellaneous (Signed)
Summary: Eye Exam  Clinical Lists Changes  Observations: Added new observation of DMEYEEXAMNXT: 02/2011 (02/06/2010 14:55) Added new observation of DMEYEEXMRES: normal (02/06/2010 14:55) Added new observation of EYE EXAM BY: Dr Mateo Flow (02/06/2010 14:55) Added new observation of DIAB EYE EX: normal (02/06/2010 14:55)       Diabetes Management Exam:    Eye Exam:       Eye Exam done elsewhere          Date: 02/06/2010          Results: normal          Done by: Dr Mateo Flow

## 2010-10-03 ENCOUNTER — Encounter: Payer: Self-pay | Admitting: Internal Medicine

## 2010-10-03 ENCOUNTER — Ambulatory Visit (INDEPENDENT_AMBULATORY_CARE_PROVIDER_SITE_OTHER): Payer: MEDICARE | Admitting: Internal Medicine

## 2010-10-03 DIAGNOSIS — J069 Acute upper respiratory infection, unspecified: Secondary | ICD-10-CM | POA: Insufficient documentation

## 2010-10-03 DIAGNOSIS — I10 Essential (primary) hypertension: Secondary | ICD-10-CM

## 2010-10-24 NOTE — Assessment & Plan Note (Signed)
Summary: CK UP/HEA   Vital Signs:  Patient profile:   70 year old female Height:      59 inches Weight:      132 pounds BMI:     26.76 O2 Sat:      100 % on Room air Temp:     98.1 degrees F oral Pulse rate:   78 / minute Resp:     18 per minute BP sitting:   140 / 80  (right arm) Cuff size:   regular  Vitals Entered By: Glendell Docker CMA (October 03, 2010 9:31 AM)  O2 Flow:  Room air CC: URI, URI symptoms Is Patient Diabetic? Yes Pain Assessment Patient in pain? no      Comments c/o head congestion, sneezing, headaches, for the last 2 days   Primary Care Provider:  Dondra Spry DO  CC:  URI and URI symptoms.  History of Present Illness:  URI Symptoms      This is a 69 year old woman who presents with URI symptoms.  The patient reports nasal congestion and clear nasal discharge.  The patient denies fever and low-grade fever (<100.5 degrees).  The patient also reports sneezing and headache.  htn - stable    Preventive Screening-Counseling & Management  Alcohol-Tobacco     Smoking Status: quit  Allergies: 1)  ! Ibuprofen 2)  ! 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   Ears:  R ear normal and L ear normal.   Mouth:  pharyngeal erythema.   Lungs:  Normal respiratory effort, chest expands symmetrically. Lungs are clear to auscultation, no crackles or wheezes. Heart:  Normal rate and regular rhythm. S1 and S2 normal without gallop, murmur, click,  rub or other extra sounds.   Impression & Recommendations:  Problem # 1:  URI (ICD-465.9)  Instructed on symptomatic treatment. Call if symptoms persist or worsen.   Problem # 2:  HYPERTENSION, BENIGN ESSENTIAL (ICD-401.1) Assessment: Unchanged  Her updated medication list for this problem includes:    Losartan Potassium 50 Mg Tabs (Losartan potassium) ..... One by mouth once daily    Amlodipine Besylate 5 Mg Tabs (Amlodipine besylate) ..... One by mouth once daily  BP today: 140/80 Prior BP: 140/80 (06/18/2010)  Labs Reviewed: K+: 4.5 (06/10/2010) Creat: : 1.38 (06/10/2010)   Chol: 174 (06/10/2010)   HDL: 72 (06/10/2010)   LDL: 85 (06/10/2010)   TG: 86 (06/10/2010)  Complete Medication List: 1)  Losartan Potassium 50 Mg Tabs (Losartan potassium) .... One by mouth once daily 2)  Pravastatin Sodium 40 Mg Tabs (Pravastatin sodium) .... One by mouth at bedtime 3)  Nystop 100000 Unit/gm Powd (Nystatin) .... Apply twice daily beneath breasts until rash is resolved 4)  Amlodipine Besylate 5 Mg Tabs (Amlodipine besylate) .... One by mouth once daily  Patient Instructions: 1)  Please schedule a follow-up appointment in 3 months.  2)  Call our office within 2 weeks with your blood pressure readings 3)  Use salt water / saline nose sprays two times a day  Prescriptions: AMLODIPINE BESYLATE 5 MG TABS (AMLODIPINE BESYLATE) one by mouth once daily  #30 x 5   Entered and Authorized by:   D. Thomos Lemons DO   Signed by:   D. Thomos Lemons DO on 10/03/2010   Method used:   Electronically to        [facility] [address] (retail)       [phone]       [place], [place]  [address]       Ph: [phone]       Fax: [phone]   RxID:   [id number]    Orders Added: 1)  Est. Patient Level III [34742]    Current Allergies (reviewed today): ! IBUPROFEN ! LISINOPRIL

## 2010-10-29 LAB — COMPREHENSIVE METABOLIC PANEL
ALT: 23 U/L (ref 0–35)
AST: 25 U/L (ref 0–37)
CO2: 28 mEq/L (ref 19–32)
Chloride: 106 mEq/L (ref 96–112)
Creatinine, Ser: 1.47 mg/dL — ABNORMAL HIGH (ref 0.4–1.2)
GFR calc Af Amer: 43 mL/min — ABNORMAL LOW (ref >60)
GFR calc non Af Amer: 35 mL/min — ABNORMAL LOW (ref >60)
Glucose, Bld: 159 mg/dL — ABNORMAL HIGH (ref 70–99)
Sodium: 140 mEq/L (ref 135–145)
Total Bilirubin: 0.7 mg/dL (ref 0.3–1.2)

## 2010-10-29 LAB — URINALYSIS, ROUTINE W REFLEX MICROSCOPIC
Bilirubin Urine: NEGATIVE
Glucose, UA: NEGATIVE mg/dL
Ketones, ur: NEGATIVE mg/dL
Nitrite: NEGATIVE
Specific Gravity, Urine: >1.03 — ABNORMAL HIGH (ref 1.005–1.030)
pH: 5.5 (ref 5.0–8.0)

## 2010-10-29 LAB — DIFFERENTIAL
Basophils Absolute: 0 10*3/uL (ref 0.0–0.1)
Basophils Relative: 1 % (ref 0–1)
Eosinophils Absolute: 0 10*3/uL (ref 0.0–0.7)
Eosinophils Relative: 1 % (ref 0–5)
Neutrophils Relative %: 60 % (ref 43–77)

## 2010-10-29 LAB — CBC
HCT: 33.3 % — ABNORMAL LOW (ref 36.0–46.0)
Hemoglobin: 11.4 g/dL — ABNORMAL LOW (ref 12.0–15.0)
MCH: 31.7 pg (ref 26.0–34.0)
RBC: 3.58 MIL/uL — ABNORMAL LOW (ref 3.87–5.11)

## 2010-10-29 LAB — SURGICAL PCR SCREEN: Staphylococcus aureus: NEGATIVE

## 2011-01-02 ENCOUNTER — Encounter: Payer: Self-pay | Admitting: Internal Medicine

## 2011-01-03 ENCOUNTER — Other Ambulatory Visit: Payer: Self-pay | Admitting: Nephrology

## 2011-01-03 DIAGNOSIS — I120 Hypertensive chronic kidney disease with stage 5 chronic kidney disease or end stage renal disease: Secondary | ICD-10-CM

## 2011-01-15 ENCOUNTER — Ambulatory Visit
Admission: RE | Admit: 2011-01-15 | Discharge: 2011-01-15 | Disposition: A | Discharge status: Home / Self Care | Payer: Medicare Other | Source: Ambulatory Visit | Attending: Nephrology | Admitting: Nephrology

## 2011-01-15 DIAGNOSIS — I120 Hypertensive chronic kidney disease with stage 5 chronic kidney disease or end stage renal disease: Secondary | ICD-10-CM

## 2011-01-16 ENCOUNTER — Ambulatory Visit: Payer: MEDICARE | Admitting: Internal Medicine

## 2011-01-21 ENCOUNTER — Ambulatory Visit: Payer: MEDICARE | Admitting: Internal Medicine

## 2011-02-11 ENCOUNTER — Encounter: Payer: Self-pay | Admitting: Family

## 2011-02-11 ENCOUNTER — Ambulatory Visit (INDEPENDENT_AMBULATORY_CARE_PROVIDER_SITE_OTHER): Payer: Medicare Other | Admitting: Family

## 2011-02-11 ENCOUNTER — Ambulatory Visit: Payer: Self-pay | Admitting: Internal Medicine

## 2011-02-11 DIAGNOSIS — E785 Hyperlipidemia, unspecified: Secondary | ICD-10-CM

## 2011-02-11 DIAGNOSIS — I10 Essential (primary) hypertension: Secondary | ICD-10-CM

## 2011-02-11 DIAGNOSIS — E119 Type 2 diabetes mellitus without complications: Secondary | ICD-10-CM

## 2011-02-11 NOTE — Progress Notes (Signed)
Subjective:    Patient ID: Brittany Robles, female    DOB: 16-Apr-1941, 70 y.o.   MRN: [id number]  HPI  Patient presents today for followup of hypertension.  Patient has been treated for Chronic HTN for quiet sometime. She is currently on amlodipine and losarta, and well controlled. No associated S/S related to HTN.   Quality: chronic Modifying factor: meds Duration: Quite sometime Associated S/S: None.  The patient denies the following associated symptoms: Chest pain, dyspnea, blurred vision, headache, or lower extremity edema.  DM2-  Diet controlled, denies polyuria or polydipsia. Has eye exam scheduled tomorrow.  Hyperlipidemia-  She stopped pravastatin 3 months ago, "just stopped it."  She reports [place] colo was done in [place] and was 5 yrs ago. Reportedly normal.      Review of Systems See HPI  Past Medical History  Diagnosis Date  . Carpal tunnel syndrome, bilateral   . Hypertension   . Spinal stenosis of lumbar region   . Diabetes mellitus     Type 2  . Bowel obstruction     History   Social History  . Marital Status: Divorced    Spouse Name: N/A    Number of Children: 3  . Years of Education: N/A   Occupational History  . Retired    Social History Main Topics  . Smoking status: Former Smoker    Quit date: 04/18/2010  . Smokeless tobacco: Not on file  . Alcohol Use: Not on file  . Drug Use: Not on file  . Sexually Active: Not on file   Other Topics Concern  . 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 Small intestine surgery     for bowel obstruction  . Appendectomy   . Spine surgery     lumbar spinal stenosis  . Cesarean section     x 2  . Carpal tunnel release     Family History  Problem Relation Age of Onset  . Cancer Other     Lung    Allergies  Allergen Reactions  . Ibuprofen   . Lisinopril      Current Outpatient Prescriptions on File Prior to Visit  Medication Sig Dispense Refill  . amLODipine (NORVASC) 5 MG tablet Take 5 mg by mouth daily.        Marland Kitchen losartan (COZAAR) 50 MG tablet Take 50 mg by mouth daily.        Marland Kitchen nystatin (NYSTOP) 100000 UNIT/GM POWD Apply twice daily beneath breasts until rash is resolved       . pravastatin (PRAVACHOL) 40 MG tablet Take 40 mg by mouth daily.          BP 130/80  Pulse 78  Temp(Src) 98 F (36.7 C) (Oral)  Resp 16  Ht 4\' 11"  (1.499 m)  Wt 136 lb 1.3 oz (61.725 kg)  BMI 27.48 kg/m2  LMP 08/19/1983       Objective:   Physical Exam  Constitutional: She appears well-developed and well-nourished.  Cardiovascular: Normal rate and regular rhythm.   Pulmonary/Chest: Effort normal and breath sounds normal.  Musculoskeletal: She exhibits no edema.       See diabetic foot exam.  Psychiatric: She has a normal mood and affect. Her behavior is normal. Judgment and thought content normal.          Assessment & Plan:  [place]  [facility] hospital- records from colo.

## 2011-02-11 NOTE — Patient Instructions (Signed)
Please complete your lab work on the first floor. Follow up in 3 months.  

## 2011-02-11 NOTE — Assessment & Plan Note (Signed)
Off of statin, check lipid panel.

## 2011-02-11 NOTE — Assessment & Plan Note (Signed)
Diet controlled Check A1C 

## 2011-02-11 NOTE — Assessment & Plan Note (Signed)
BP Readings from Last 3 Encounters:  02/11/11 130/80  10/03/10 140/80  06/18/10 140/80  BP is stable.  Continue current meds.

## 2011-02-12 ENCOUNTER — Encounter: Payer: Self-pay | Admitting: Family

## 2011-02-12 LAB — BASIC METABOLIC PANEL
CO2: 26 mEq/L (ref 19–32)
Chloride: 105 mEq/L (ref 96–112)
Potassium: 5.1 mEq/L (ref 3.5–5.3)
Sodium: 140 mEq/L (ref 135–145)

## 2011-02-12 LAB — LIPID PANEL
HDL: 66 mg/dL (ref >39)
LDL Cholesterol: 118 mg/dL — ABNORMAL HIGH (ref 0–99)
Total CHOL/HDL Ratio: 3 Ratio

## 2011-02-12 LAB — HEPATIC FUNCTION PANEL: Total Bilirubin: 0.6 mg/dL (ref 0.3–1.2)

## 2011-02-17 ENCOUNTER — Encounter: Payer: Self-pay | Admitting: Internal Medicine

## 2011-03-20 ENCOUNTER — Telehealth: Payer: Self-pay | Admitting: *Deleted

## 2011-03-20 NOTE — Telephone Encounter (Signed)
Colonoscopy report dated 09/03/05 received from [facility] and forwarded to Provider for review.

## 2011-04-21 ENCOUNTER — Other Ambulatory Visit: Payer: Self-pay | Admitting: Internal Medicine

## 2011-05-26 ENCOUNTER — Encounter: Payer: Self-pay | Admitting: Family

## 2011-05-26 ENCOUNTER — Telehealth: Payer: Self-pay | Admitting: *Deleted

## 2011-05-26 ENCOUNTER — Ambulatory Visit (INDEPENDENT_AMBULATORY_CARE_PROVIDER_SITE_OTHER): Payer: Medicare Other | Admitting: Family

## 2011-05-26 DIAGNOSIS — N643 Galactorrhea not associated with childbirth: Secondary | ICD-10-CM

## 2011-05-26 LAB — BASIC METABOLIC PANEL WITH GFR
BUN: 28 mg/dL — ABNORMAL HIGH (ref 6–23)
CO2: 21 meq/L (ref 19–32)
Calcium: 9 mg/dL (ref 8.4–10.5)
Chloride: 107 meq/L (ref 96–112)
Creat: 1.25 mg/dL — ABNORMAL HIGH (ref 0.50–1.10)
Glucose, Bld: 107 mg/dL — ABNORMAL HIGH (ref 70–99)
Potassium: 4.5 meq/L (ref 3.5–5.3)
Sodium: 140 meq/L (ref 135–145)

## 2011-05-26 LAB — TSH: TSH: 1.569 u[IU]/mL (ref 0.350–4.500)

## 2011-05-26 LAB — PROLACTIN: Prolactin: 6.6 ng/mL

## 2011-05-26 NOTE — Telephone Encounter (Signed)
Received call from [facility] at [facility] stating pt's insurance will only cover a diagnostic mammogram for the affected breast. They are requesting order be re-sent with correction to the affected breast only or insurance will not cover until after 06/28/11 as that was her last mammogram.  Please advise.

## 2011-05-26 NOTE — Telephone Encounter (Signed)
Notified Brittany Robles of correction. She states that order should state diagnostic mammogram right side. Do not choose "limited" for future orders. She will correct in the system.

## 2011-05-26 NOTE — Telephone Encounter (Signed)
I have cancelled order for bilateral mammogram and instead ordered right diagnostic mammogram.

## 2011-05-26 NOTE — Assessment & Plan Note (Signed)
Will plan to check diagnostic mammogram, TSH, BMET and prolactin level.  Pt to follow up in 1 month.

## 2011-05-26 NOTE — Progress Notes (Signed)
Addended by: Sandford Craze on: 05/26/2011 03:13 PM   Modules accepted: Orders

## 2011-05-26 NOTE — Patient Instructions (Signed)
Please complete your lab work on the first floor.  You will be contacted by the breast center to schedule your mammogram.  Let us know if you have not heard from them in 1 week. Follow up in 1 month- call sooner if your develop pain, fever or redness of the breast.

## 2011-05-26 NOTE — Progress Notes (Signed)
  Subjective:    Patient ID: Brittany Robles, female    DOB: 11/27/1940, 70 y.o.   MRN: [id number]  HPI  Brittany Robles is a 70 yr old female who presents today with chief complaint of galactorrhea.  Symptoms have been present for 1 week and are occuring in the right breast only.  She denies associated pain, redness, swelling or fever.  Drainage is noted to be clear to milky in color. She noted some spontaneous drainage while exercising, but can she is also able to manually express the drainage.    She denies previous hx of breast drainage.  Last mammogram 11/11- normal.     Review of Systems    see HPI Past Medical History  Diagnosis Date  . Carpal tunnel syndrome, bilateral   . Hypertension   . Spinal stenosis of lumbar region   . Diabetes mellitus     Type 2  . Bowel obstruction     History   Social History  . Marital Status: Divorced    Spouse Name: N/A    Number of Children: 3  . Years of Education: N/A   Occupational History  . Retired    Social History Main Topics  . Smoking status: Former Smoker    Quit date: 04/18/2010  . Smokeless tobacco: Not on file  . Alcohol Use: Not on file  . Drug Use: Not on file  . Sexually Active: Not on file   Other Topics Concern  . 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 Small intestine surgery     for bowel obstruction  . Appendectomy   . Spine surgery     lumbar spinal stenosis  . Cesarean section     x 2  . Carpal tunnel release     Family History  Problem Relation Age of Onset  . Cancer Other     Lung    Allergies  Allergen Reactions  . Ibuprofen   . Lisinopril     Current Outpatient Prescriptions on File Prior to Visit  Medication Sig Dispense Refill  . amLODipine (NORVASC) 5 MG tablet Take 5 mg by mouth daily.        Marland Kitchen losartan (COZAAR) 50 MG tablet TAKE ONE TABLET BY  MOUTH EVERY DAY  90 tablet  0  . nystatin (NYSTOP) 100000 UNIT/GM POWD Apply twice daily beneath breasts until rash is resolved         BP 150/82  Pulse 72  Temp(Src) 98.1 F (36.7 C) (Oral)  Resp 16  Ht 4\' 11"  (1.499 m)  Wt 140 lb (63.504 kg)  BMI 28.28 kg/m2  LMP 08/19/1983    Objective:   Physical Exam  Constitutional: She appears well-developed and well-nourished.  HENT:  Head: Normocephalic and atraumatic.  Cardiovascular: Normal rate and regular rhythm.   No murmur heard. Pulmonary/Chest: Effort normal and breath sounds normal. No respiratory distress. She has no wheezes. She has no rales. She exhibits no tenderness.  Genitourinary:       Right breast exam is normal.  No masses, retractions or thickening is noted. Pt is unable to express any drainage at time of exam.           Assessment & Plan:

## 2011-05-27 ENCOUNTER — Encounter: Payer: Self-pay | Admitting: Family

## 2011-06-30 ENCOUNTER — Ambulatory Visit: Payer: Medicare Other | Admitting: Family

## 2011-07-03 ENCOUNTER — Ambulatory Visit
Admission: RE | Admit: 2011-07-03 | Discharge: 2011-07-03 | Disposition: A | Discharge status: Home / Self Care | Payer: Medicare Other | Source: Ambulatory Visit | Attending: Family | Admitting: Family

## 2011-07-03 ENCOUNTER — Other Ambulatory Visit: Payer: Self-pay | Admitting: Family

## 2011-07-04 ENCOUNTER — Ambulatory Visit (INDEPENDENT_AMBULATORY_CARE_PROVIDER_SITE_OTHER): Payer: Medicare Other | Admitting: Family

## 2011-07-04 ENCOUNTER — Encounter: Payer: Self-pay | Admitting: Family

## 2011-07-04 VITALS — BP 120/80 | HR 72 | Temp 97.8°F | Resp 16 | Ht 59.0 in | Wt 139.1 lb

## 2011-07-04 DIAGNOSIS — N643 Galactorrhea not associated with childbirth: Secondary | ICD-10-CM

## 2011-07-04 DIAGNOSIS — I10 Essential (primary) hypertension: Secondary | ICD-10-CM

## 2011-07-04 DIAGNOSIS — R21 Rash and other nonspecific skin eruption: Secondary | ICD-10-CM | POA: Insufficient documentation

## 2011-07-04 DIAGNOSIS — Q613 Polycystic kidney, unspecified: Secondary | ICD-10-CM

## 2011-07-04 NOTE — Assessment & Plan Note (Signed)
Ultrasound of breast reveals 4 mm lobular mass within a dilated duct in the subareolar portion of the right breast at the 6 o'clock location,  suspicious for a papilloma. She had a normal prolactin level and galactorrhea has resolved. She tells me that she will schedule her biopsy after Thanksgiving.

## 2011-07-04 NOTE — Patient Instructions (Signed)
Please complete your blood work prior to leaving.   Please schedule your breast biopsy.   Follow up in 3 months, sooner if problems or concerns. Have a nice Thanksgiving!

## 2011-07-04 NOTE — Assessment & Plan Note (Signed)
Sounds like intermittent HSV2 flares. Will check titer.

## 2011-07-04 NOTE — Assessment & Plan Note (Signed)
Creatinine is stable.

## 2011-07-04 NOTE — Assessment & Plan Note (Signed)
BP is stable on amlodipine and cozaar.  Continue same.

## 2011-07-04 NOTE — Progress Notes (Signed)
Subjective:    Patient ID: Brittany Robles, female    DOB: [birth year], 70 y.o.   MRN: [id number]  HPI  [name] is a 70 yr old female who presents today for follow up.  1) Galactorrhea- she had a diagnostic mammogram/ultrasound performed which noted an approximate 4 mm lobular mass within a dilated duct in the subareolar portion of the right breast at the 6 o'clock location, suspicious for a papilloma. She has not had any further.    2) Tobacco abuse-  Quit > 1 yr ago.    3) HTN-  She is continued on amlodipine and Cozaar.    4) Polycystic kidney disease- Baseline Creatinine is 1.2- stable last visit.   5) Vaginal lesions-  She reports intermittent vaginal "boils" with pus.  Denies current lesion.  She reports that this happens intermittently and first started happening about 9 months ago. She has been with her current partner for 4 years.   Review of Systems    see HPI  Past Medical History  Diagnosis Date  . Carpal tunnel syndrome, bilateral   . Hypertension   . Spinal stenosis of lumbar region   . Diabetes mellitus     Type 2  . Bowel obstruction     History   Social History  . Marital Status: Divorced    Spouse Name: N/A    Number of Children: 3  . Years of Education: N/A   Occupational History  . Retired    Social History Main Topics  . Smoking status: Former Smoker    Quit date: 04/18/2010  . Smokeless tobacco: Not on file  . Alcohol Use: Not on file  . Drug Use: Not on file  . Sexually Active: Not on file   Other Topics Concern  . 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 Small intestine surgery     for bowel obstruction  . Appendectomy   . Spine surgery     lumbar spinal stenosis  . Cesarean section     x 2  . Carpal tunnel release     Family History  Problem Relation Age of Onset  . Cancer Other    Lung    Allergies  Allergen Reactions  . Ibuprofen   . Lisinopril     Current Outpatient Prescriptions on File Prior to Visit  Medication Sig Dispense Refill  . amLODipine (NORVASC) 5 MG tablet Take 5 mg by mouth daily.        Marland Kitchen losartan (COZAAR) 50 MG tablet TAKE ONE TABLET BY MOUTH EVERY DAY  90 tablet  0  . nystatin (NYSTOP) 100000 UNIT/GM POWD Apply twice daily beneath breasts until rash is resolved         BP 120/80  Pulse 72  Temp(Src) 97.8 F (36.6 C) (Oral)  Resp 16  Ht 4\' 11"  (1.499 m)  Wt 139 lb 1.3 oz (63.086 kg)  BMI 28.09 kg/m2  LMP 08/19/1983    Objective:   Physical Exam  Constitutional: She appears well-developed and well-nourished. No distress.  HENT:  Head: Normocephalic and atraumatic.  Cardiovascular: Normal rate and regular rhythm.   No murmur heard. Pulmonary/Chest: Effort normal and breath sounds normal. No respiratory distress. She has no wheezes. She has no rales.  Musculoskeletal: She exhibits no edema.  Psychiatric: She has a normal mood and affect. Her  behavior is normal. Judgment and thought content normal.          Assessment & Plan:

## 2011-07-07 LAB — HSV 1 ANTIBODY, IGG: HSV 1 Glycoprotein G Ab, IgG: 5.85 IV — ABNORMAL HIGH

## 2011-07-08 ENCOUNTER — Other Ambulatory Visit: Payer: Self-pay | Admitting: Family

## 2011-07-08 DIAGNOSIS — N63 Unspecified lump in unspecified breast: Secondary | ICD-10-CM

## 2011-07-14 ENCOUNTER — Encounter: Payer: Self-pay | Admitting: Family

## 2011-07-14 ENCOUNTER — Telehealth: Payer: Self-pay | Admitting: Family

## 2011-07-14 DIAGNOSIS — A6000 Herpesviral infection of urogenital system, unspecified: Secondary | ICD-10-CM

## 2011-07-14 HISTORY — DX: Herpesviral infection of urogenital system, unspecified: A60.00

## 2011-07-14 MED ORDER — VALACYCLOVIR HCL 500 MG PO TABS: ORAL_TABLET | ORAL | Status: DC

## 2011-07-14 NOTE — Telephone Encounter (Signed)
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  This explains her intermittent vaginal "boils."  Left message with female at number listed requesting call back.

## 2011-07-16 ENCOUNTER — Ambulatory Visit
Admission: RE | Admit: 2011-07-16 | Discharge: 2011-07-16 | Disposition: A | Discharge status: Home / Self Care | Payer: Medicare Other | Source: Ambulatory Visit | Attending: Family | Admitting: Family

## 2011-07-16 ENCOUNTER — Telehealth: Payer: Self-pay | Admitting: Family

## 2011-07-16 DIAGNOSIS — N63 Unspecified lump in unspecified breast: Secondary | ICD-10-CM

## 2011-07-16 NOTE — Telephone Encounter (Signed)
Attempted to reach patient at home number, no answer and no answering machine.

## 2011-07-16 NOTE — Telephone Encounter (Signed)
Patient is requesting lab results. Per patient, do not give results to anyone other than herself.

## 2011-07-17 NOTE — Telephone Encounter (Signed)
Left message with female family member requesting that pt return my call tomorrow when I am back in the office.

## 2011-07-18 NOTE — Telephone Encounter (Signed)
Called pt.  Not home- left message with family member.

## 2011-07-18 NOTE — Telephone Encounter (Signed)
Pt returned your call and requests a call back at [phone].

## 2011-07-18 NOTE — Telephone Encounter (Signed)
Tried number twice, sounded like phone was picked up and then hung up both times.

## 2011-07-21 NOTE — Telephone Encounter (Signed)
Spoke with pt, reviewed HSV1 and 2 positive results and plan for prn valtrex.  She was instructed to discuss these results with her partner.  Also discussed results of breast imaging and need for f/u imaging in 6 months. Pt verbalized understanding.

## 2011-09-12 ENCOUNTER — Other Ambulatory Visit: Payer: Self-pay | Admitting: Internal Medicine

## 2011-09-12 NOTE — Telephone Encounter (Signed)
Losartan refill sent to pharmacy [id number] x no refills. Pt is due for follow up in February and has no appts on file. Please call pt to arrange follow up.

## 2011-09-15 NOTE — Telephone Encounter (Signed)
Could not reach patient. She does not accept blocked numbers.

## 2011-09-19 ENCOUNTER — Other Ambulatory Visit: Payer: Self-pay | Admitting: Family

## 2011-09-19 DIAGNOSIS — N6452 Nipple discharge: Secondary | ICD-10-CM

## 2011-10-06 ENCOUNTER — Telehealth: Payer: Self-pay | Admitting: Family

## 2011-10-06 NOTE — Telephone Encounter (Signed)
Scheduled order for February 1,2013  , [facility] pt had both U/S  And   ductogram   On November 15

## 2011-10-06 NOTE — Telephone Encounter (Signed)
I reviewed referral and pt was referred for biopsy.  [facility] with [facility].  Biopsy was cancelled by Dr. Oneal Grout.  I have left message for her requesting further info on cancelled order and rationale for cancelling.

## 2011-10-07 NOTE — Telephone Encounter (Signed)
Spoke with Dr. Whitney Muse.  She confirms that biopsy was cancelled due to results of follow up ultrasound.

## 2011-10-07 NOTE — Telephone Encounter (Signed)
I have not heard back from Dr. Whitney Muse, but reviewed addendum on 11/28: Sonography today reveals dilated ducts in the subareolar region  without intraductal mass. The patient does not note bloody nipple discharge and clear discharge is elicited on prior exam. Therefore, a follow up ultrasound of the right breast is recommended in 6 months.

## 2011-10-15 ENCOUNTER — Ambulatory Visit (INDEPENDENT_AMBULATORY_CARE_PROVIDER_SITE_OTHER): Payer: Medicare Other | Admitting: Family

## 2011-10-15 ENCOUNTER — Encounter: Payer: Self-pay | Admitting: Family

## 2011-10-15 DIAGNOSIS — M160 Bilateral primary osteoarthritis of hip: Secondary | ICD-10-CM | POA: Insufficient documentation

## 2011-10-15 DIAGNOSIS — M169 Osteoarthritis of hip, unspecified: Secondary | ICD-10-CM

## 2011-10-15 MED ORDER — TRAMADOL HCL 50 MG PO TABS: 50.0000 mg | ORAL_TABLET | Freq: Three times a day (TID) | ORAL | Status: AC | PRN

## 2011-10-15 NOTE — Progress Notes (Signed)
Subjective:    Patient ID: Brittany Robles, female    DOB: Nov 04, 1940, 71 y.o.   MRN: [id number]  HPI  [name] is a 71 yr old female who presents today with chief complaint of bilateral hip pain.  She reports that she first noticed about 10 days ago. Symptoms start in the morning when she gets out of bed.  Pain tends to improve as the day wears on.  Then she notices it again at night when she tries to rise from a seated position. Symptoms are alleviated by nothing. Denies previous hx of hip pain.      Review of Systems See HPI  Past Medical History  Diagnosis Date  . Carpal tunnel syndrome, bilateral   . Hypertension   . Spinal stenosis of lumbar region   . Diabetes mellitus     Type 2  . Bowel obstruction   . 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    History   Social History  . Marital Status: Divorced    Spouse Name: N/A    Number of Children: 3  . Years of Education: N/A   Occupational History  . Retired    Social History Main Topics  . Smoking status: Former Smoker    Quit date: 04/18/2010  . Smokeless tobacco: Not on file  . Alcohol Use: Not on file  . Drug Use: Not on file  . Sexually Active: Not on file   Other Topics Concern  . 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 Small intestine surgery     for bowel obstruction  . Appendectomy   . Spine surgery     lumbar spinal stenosis  . Cesarean section     x 2  . Carpal tunnel release     Family History  Problem Relation Age of Onset  . Cancer Other     Lung    Allergies  Allergen Reactions  . Ibuprofen   . Lisinopril     Current Outpatient Prescriptions on File Prior to Visit  Medication Sig Dispense Refill  . amLODipine (NORVASC) 5 MG tablet Take 5 mg by mouth daily.        Marland Kitchen losartan (COZAAR) 50 MG tablet TAKE ONE TABLET BY MOUTH EVERY DAY  90 tablet  0    . nystatin (NYSTOP) 100000 UNIT/GM POWD Apply twice daily beneath breasts until rash is resolved       . valACYclovir (VALTREX) 500 MG tablet One tablet twice daily for 3 days. Start at first sign of breakout.  6 tablet  2    BP 102/70  Pulse 86  Temp(Src) 98.2 F (36.8 C) (Oral)  Resp 16  Wt 137 lb 1.3 oz (62.179 kg)  SpO2 97%  LMP 08/19/1983       Objective:   Physical Exam  Constitutional: She is oriented to person, place, and time. She appears well-developed and well-nourished. No distress.  Cardiovascular: Normal rate and regular rhythm.   No murmur heard. Pulmonary/Chest: Effort normal and breath sounds normal. No respiratory distress. She has no wheezes. She has no rales. She exhibits no tenderness.  Musculoskeletal: She exhibits no edema.       No tenderness to palpation of right or left hips.  Increased pain bilaterally with hip abduction.   Neurological: She is alert and oriented to person, place, and time.  Psychiatric: She has a normal mood and affect. Her behavior is normal. Thought content normal.          Assessment & Plan:

## 2011-10-15 NOTE — Assessment & Plan Note (Addendum)
Symptoms most consistent with OA.  Avoid NSAIDS due to Polycystic kidney disease.  Will plan to treat with tylenol and prn ultram.  If no improvement in these measures,consider x-ray and orthopedic referral.

## 2011-10-15 NOTE — Patient Instructions (Signed)
Call if hip pain worsens, or if no improvement.

## 2011-10-16 ENCOUNTER — Telehealth: Payer: Self-pay | Admitting: *Deleted

## 2011-10-16 NOTE — Telephone Encounter (Signed)
Received call from pt stating Melissa was going to send an Rx for tylenol to her pharmacy. Reviewed 10/15/11 office note and advised pt that Tramadol was sent to the pharmacy and tylenol should be purchased otc. Pt voices understanding.

## 2011-10-29 ENCOUNTER — Telehealth: Payer: Self-pay | Admitting: Family

## 2011-10-29 DIAGNOSIS — M25559 Pain in unspecified hip: Secondary | ICD-10-CM

## 2011-10-29 NOTE — Telephone Encounter (Signed)
Notified Brittany Robles and she is agreeable to proceed with referral to Dr Pearletha Forge. Advised Brittany Robles to call us back if she doesn't hear from [place] by next Wednesday.

## 2011-10-29 NOTE — Telephone Encounter (Signed)
I would like to get her in to see Dr. Pearletha Forge for further evaluation of her hip pain and see what he recommends.

## 2011-10-29 NOTE — Telephone Encounter (Signed)
Patient states that she saw Melissa at the end of February for hip pain. Was given Tylenol Extra Strength and Tramadol for pain. She states that she is still hurting, that the medicine is not working. She wants to know if there is anything else she can take?

## 2011-11-03 ENCOUNTER — Telehealth: Payer: Self-pay | Admitting: *Deleted

## 2011-11-03 MED ORDER — NYSTATIN 100000 UNIT/GM EX POWD: CUTANEOUS | Status: DC

## 2011-11-03 NOTE — Telephone Encounter (Signed)
Pt left message requesting refill of powder for rash under her breasts. Please advised re: refills.

## 2011-11-03 NOTE — Telephone Encounter (Signed)
Rx sent 

## 2011-11-03 NOTE — Telephone Encounter (Signed)
Pt notified and has already picked rx up.

## 2011-11-11 ENCOUNTER — Telehealth: Payer: Self-pay | Admitting: Family

## 2011-11-11 NOTE — Telephone Encounter (Signed)
Notified pt. 

## 2011-11-11 NOTE — Telephone Encounter (Signed)
Patient states that she bought [facility] dietary supplement "hair,skin, and nails" OTC and would like to know if this would interfere with her other medication.

## 2011-11-11 NOTE — Telephone Encounter (Signed)
Should be ok for her to take with her other medications.

## 2011-11-11 NOTE — Telephone Encounter (Signed)
I will need to know what ingredients are in the supplement please first.

## 2011-11-11 NOTE — Telephone Encounter (Signed)
Spoke with pt and she reports that the supplement contains: Vitamin A, C,D,E, thiamin, vit B1, B6,B12, folic acid, calcium, iron, magnesium, zinc and sodium.

## 2011-11-25 ENCOUNTER — Ambulatory Visit (INDEPENDENT_AMBULATORY_CARE_PROVIDER_SITE_OTHER): Payer: Medicare Other | Admitting: Family

## 2011-11-25 ENCOUNTER — Encounter: Payer: Self-pay | Admitting: Family

## 2011-11-25 VITALS — BP 140/84 | HR 73 | Temp 98.3°F | Resp 16 | Ht 59.0 in | Wt 138.0 lb

## 2011-11-25 DIAGNOSIS — L538 Other specified erythematous conditions: Secondary | ICD-10-CM

## 2011-11-25 DIAGNOSIS — N950 Postmenopausal bleeding: Secondary | ICD-10-CM | POA: Insufficient documentation

## 2011-11-25 DIAGNOSIS — N898 Other specified noninflammatory disorders of vagina: Secondary | ICD-10-CM

## 2011-11-25 DIAGNOSIS — L304 Erythema intertrigo: Secondary | ICD-10-CM | POA: Insufficient documentation

## 2011-11-25 MED ORDER — FLUCONAZOLE 150 MG PO TABS: ORAL_TABLET | ORAL | Status: DC

## 2011-11-25 NOTE — Assessment & Plan Note (Signed)
Will rx with fluconozole 150, repeat in 1 week as needed. Continue nystatin powder prn.

## 2011-11-25 NOTE — Assessment & Plan Note (Signed)
Thin white discharge is noted and a wet prep is performed today.  Will refer to GYN for further evaluation as she is post-menopausal.

## 2011-11-25 NOTE — Patient Instructions (Addendum)
You will be contacted about your referral to the GYN. Please follow up in 3 months- sooner if problems/concerns.

## 2011-11-25 NOTE — Progress Notes (Signed)
Subjective:    Patient ID: Brittany Robles, female    DOB: 1941/06/12, 71 y.o.   MRN: [id number]  HPI  Brittany Robles is a 71 yr old female who presents today with chief complaint of rash.  Rash is located beneath her breasts. She has been using nystatin powder with slight improvement, but no resolution of the rash.  Rash is pruritic.  Vaginal bleeding- started on Friday night.  Describes the flow as light. Stopped Saturday.  LMP was at age 23.    Review of Systems    see HPI  Past Medical History  Diagnosis Date  . Carpal tunnel syndrome, bilateral   . Hypertension   . Spinal stenosis of lumbar region   . Diabetes mellitus     Type 2  . Bowel obstruction   . 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 POLYCYSTIC KIDNEY DISEASE 10/11/2009    History   Social History  . Marital Status: Divorced    Spouse Name: N/A    Number of Children: 3  . Years of Education: N/A   Occupational History  . Retired    Social History Main Topics  . Smoking status: Former Smoker    Quit date: 04/18/2010  . Smokeless tobacco: Not on file  . Alcohol Use: Not on file  . Drug Use: Not on file  . Sexually Active: Not on file   Other Topics Concern  . 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 Small intestine surgery     for bowel obstruction  . Appendectomy   . Spine surgery     lumbar spinal stenosis  . Cesarean section     x 2  . Carpal tunnel release     Family History  Problem Relation Age of Onset  . Cancer Other     Lung    Allergies  Allergen Reactions  . Ibuprofen   . Lisinopril     Current Outpatient Prescriptions on File Prior to Visit  Medication Sig Dispense Refill  . acetaminophen (TYLENOL) 325 MG tablet Take 650 mg by mouth every 6 (six) hours as needed.      Marland Kitchen amLODipine (NORVASC) 5 MG tablet Take 5 mg by mouth daily.        Marland Kitchen  losartan (COZAAR) 50 MG tablet TAKE ONE TABLET BY MOUTH EVERY DAY  90 tablet  0  . nystatin (NYSTOP) 100000 UNIT/GM POWD Apply twice daily beneath breasts until rash is resolved  30 g  2  . valACYclovir (VALTREX) 500 MG tablet One tablet twice daily for 3 days. Start at first sign of breakout.  6 tablet  2    BP 140/84  Pulse 73  Temp(Src) 98.3 F (36.8 C) (Oral)  Resp 16  Ht 4\' 11"  (1.499 m)  Wt 138 lb (62.596 kg)  BMI 27.87 kg/m2  SpO2 98%  LMP 08/19/1983    Objective:   Physical Exam  Constitutional: She appears well-developed and well-nourished. No distress.  Cardiovascular: Normal rate and regular rhythm.   No murmur heard. Pulmonary/Chest: Effort normal and breath sounds normal. No respiratory distress. She has no wheezes. She has no rales. She exhibits no tenderness.  Genitourinary:       Thin white vaginal discharge is noted.  Cervix is normal.  No vulvar or vaginal lesions noted.  Normal bimanual exam without appreciable uterine  enlargement. No adnexal fullness is noted.   Skin:       Hyperpigmented rash noted beneath breasts.          Assessment & Plan:

## 2011-11-26 ENCOUNTER — Telehealth: Payer: Self-pay | Admitting: Family

## 2011-11-26 LAB — WET PREP BY MOLECULAR PROBE: Gardnerella vaginalis: POSITIVE — AB

## 2011-11-26 MED ORDER — METRONIDAZOLE 0.75 % VA GEL: 1.0000 | Freq: Two times a day (BID) | VAGINAL | Status: AC

## 2011-11-26 NOTE — Telephone Encounter (Signed)
Pls call pt and let her now that her wet prep shows bacterial vaginosis- a common bacterial infection.  I have sent rx to [facility] for metrogel.

## 2011-11-26 NOTE — Telephone Encounter (Signed)
Pt notified and voices understanding. 

## 2012-01-03 ENCOUNTER — Other Ambulatory Visit: Payer: Self-pay | Admitting: Family

## 2012-01-21 ENCOUNTER — Telehealth: Payer: Self-pay | Admitting: Family

## 2012-01-21 DIAGNOSIS — N643 Galactorrhea not associated with childbirth: Secondary | ICD-10-CM

## 2012-01-21 NOTE — Telephone Encounter (Signed)
Please call pt and let her know that she is due for a 6 month follow up breast ultrasound.

## 2012-01-22 NOTE — Telephone Encounter (Signed)
Call placed to patient at [phone], female individual stated patient was out of town. A message was left for patient to return phone call.

## 2012-01-28 NOTE — Telephone Encounter (Signed)
Attempted to reach pt, left message with female to have pt return my call. She states pt is out of town until this weekend and will give her the message.

## 2012-02-04 ENCOUNTER — Other Ambulatory Visit: Payer: Self-pay | Admitting: Family

## 2012-02-04 NOTE — Telephone Encounter (Signed)
Letter mailed to pt to contact our office and schedule ultrasound.

## 2012-02-24 ENCOUNTER — Ambulatory Visit
Admission: RE | Admit: 2012-02-24 | Discharge: 2012-02-24 | Disposition: A | Discharge status: Home / Self Care | Payer: Medicare Other | Source: Ambulatory Visit | Attending: Family | Admitting: Family

## 2012-02-24 ENCOUNTER — Encounter: Payer: Self-pay | Admitting: Family

## 2012-02-24 DIAGNOSIS — N643 Galactorrhea not associated with childbirth: Secondary | ICD-10-CM

## 2012-02-25 ENCOUNTER — Telehealth: Payer: Self-pay | Admitting: Family

## 2012-02-25 ENCOUNTER — Ambulatory Visit (INDEPENDENT_AMBULATORY_CARE_PROVIDER_SITE_OTHER): Payer: Medicare Other | Admitting: Family

## 2012-02-25 ENCOUNTER — Ambulatory Visit (HOSPITAL_BASED_OUTPATIENT_CLINIC_OR_DEPARTMENT_OTHER)
Admission: RE | Admit: 2012-02-25 | Discharge: 2012-02-25 | Disposition: A | Discharge status: Home / Self Care | Payer: Medicare Other | Source: Ambulatory Visit | Attending: Family | Admitting: Family

## 2012-02-25 ENCOUNTER — Encounter: Payer: Self-pay | Admitting: Family

## 2012-02-25 VITALS — BP 150/86 | HR 73 | Temp 98.0°F | Resp 16 | Ht 59.0 in | Wt 134.1 lb

## 2012-02-25 DIAGNOSIS — I708 Atherosclerosis of other arteries: Secondary | ICD-10-CM

## 2012-02-25 DIAGNOSIS — I10 Essential (primary) hypertension: Secondary | ICD-10-CM

## 2012-02-25 DIAGNOSIS — M542 Cervicalgia: Secondary | ICD-10-CM | POA: Insufficient documentation

## 2012-02-25 DIAGNOSIS — E119 Type 2 diabetes mellitus without complications: Secondary | ICD-10-CM

## 2012-02-25 DIAGNOSIS — E785 Hyperlipidemia, unspecified: Secondary | ICD-10-CM

## 2012-02-25 DIAGNOSIS — N643 Galactorrhea not associated with childbirth: Secondary | ICD-10-CM

## 2012-02-25 DIAGNOSIS — Q613 Polycystic kidney, unspecified: Secondary | ICD-10-CM

## 2012-02-25 DIAGNOSIS — Z0000 Encounter for general adult medical examination without abnormal findings: Secondary | ICD-10-CM

## 2012-02-25 DIAGNOSIS — M5030 Other cervical disc degeneration, unspecified cervical region: Secondary | ICD-10-CM | POA: Insufficient documentation

## 2012-02-25 DIAGNOSIS — D649 Anemia, unspecified: Secondary | ICD-10-CM

## 2012-02-25 DIAGNOSIS — N182 Chronic kidney disease, stage 2 (mild): Secondary | ICD-10-CM

## 2012-02-25 DIAGNOSIS — R209 Unspecified disturbances of skin sensation: Secondary | ICD-10-CM | POA: Insufficient documentation

## 2012-02-25 LAB — BASIC METABOLIC PANEL WITH GFR
BUN: 26 mg/dL — ABNORMAL HIGH (ref 6–23)
Chloride: 105 mEq/L (ref 96–112)
GFR, Est African American: 45 mL/min — ABNORMAL LOW
GFR, Est Non African American: 39 mL/min — ABNORMAL LOW
Potassium: 5 mEq/L (ref 3.5–5.3)
Sodium: 138 mEq/L (ref 135–145)

## 2012-02-25 LAB — HEPATIC FUNCTION PANEL
Alkaline Phosphatase: 50 U/L (ref 39–117)
Bilirubin, Direct: 0.1 mg/dL (ref 0.0–0.3)
Indirect Bilirubin: 0.3 mg/dL (ref 0.0–0.9)
Total Protein: 7.3 g/dL (ref 6.0–8.3)

## 2012-02-25 LAB — LIPID PANEL
HDL: 62 mg/dL (ref >39)
LDL Cholesterol: 113 mg/dL — ABNORMAL HIGH (ref 0–99)
Total CHOL/HDL Ratio: 3.1 Ratio
Triglycerides: 90 mg/dL (ref <150)
VLDL: 18 mg/dL (ref 0–40)

## 2012-02-25 LAB — HEMOGLOBIN A1C
Hgb A1c MFr Bld: 6.3 % — ABNORMAL HIGH (ref <5.7)
Mean Plasma Glucose: 134 mg/dL — ABNORMAL HIGH (ref <117)

## 2012-02-25 MED ORDER — METHYLPREDNISOLONE (PAK) 4 MG PO TABS: ORAL_TABLET | ORAL | Status: AC

## 2012-02-25 MED ORDER — AMLODIPINE BESYLATE 5 MG PO TABS: 5.0000 mg | ORAL_TABLET | Freq: Every day | ORAL | Status: DC

## 2012-02-25 NOTE — Progress Notes (Signed)
Subjective:    Patient ID: Brittany Robles, female    DOB: [birth year], 71 y.o.   MRN: [id number]  HPI  Subjective:   Patient here for Medicare annual wellness visit and management of other chronic and acute problems.  Preventative- Pt here for fasting physical. Pt up to date with tetanus, pneumovax and mammogram. Last pap smear 2011, last colonoscopy 09/2005, last dexa 2008.   Neck pain- pt reports neck pain, associated with hands tingling.  Continues to have low back pain which is intense at times.  Completed PT.  HTN- ran out of amlodipine.  Has been out for a while.    Polycystic kidney disease- Has seen Dr. Eliott Nine (Nephrology). Now only following on a PRN basis at pt request.  Galactorrhea- reports rare breast discharge at this time.  She had work up at the breast center which was benign.  It is recommended that she have a 1 yr screening mammogram June 2014.   Risk factors: at risk for CAD due to HTN, Hyperlipidemia.  Roster of Physicians Providing Medical Care to Patient: Dr.   Lisbeth Renshaw GYN Dr.   Gaylord Shih  Activities of Daily Living  In your present state of health, do you have any difficulty performing the following activities? Preparing food and eating?: No  Bathing yourself: No  Getting dressed: No  Using the toilet:No  Moving around from place to place: No  In the past year have you fallen or had a near fall?:No    Home Safety: Has smoke detector and wears seat belts. No firearms. No excess sun exposure.  Diet and Exercise  Current exercise habits: stopped exercising due to back pain.   Dietary issues discussed: healthy diet   Depression Screen  (Note: if answer to either of the following is "Yes", then a more complete depression screening is indicated)  Q1: Over the past two weeks, have you felt down, depressed or hopeless?no  Q2: Over the past two weeks, have you felt little interest or pleasure in doing things? no   The following portions of the patient's  history were reviewed and updated as appropriate: allergies, current medications, past family history, past medical history, past social history, past surgical history and problem list.   Past Medical History  Diagnosis Date  . Carpal tunnel syndrome, bilateral   . Hypertension   . Spinal stenosis of lumbar region   . Diabetes mellitus     Type 2  . Bowel obstruction   . 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 POLYCYSTIC KIDNEY DISEASE 10/11/2009    History   Social History  . Marital Status: Divorced    Spouse Name: N/A    Number of Children: 3  . Years of Education: N/A   Occupational History  . Retired    Social History Main Topics  . Smoking status: Former Smoker    Quit date: 04/18/2010  . Smokeless tobacco: Not on file  . Alcohol Use: Not on file  . Drug Use: Not on file  . Sexually Active: Not on file   Other Topics Concern  . 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 Small intestine surgery     for bowel obstruction  . Appendectomy   . Spine surgery     lumbar spinal stenosis  . Cesarean section     x 2  . Carpal  tunnel release     Family History  Problem Relation Age of Onset  . Cancer Other     Lung    Allergies  Allergen Reactions  . Ibuprofen   . Lisinopril     Current Outpatient Prescriptions on File Prior to Visit  Medication Sig Dispense Refill  . acetaminophen (TYLENOL) 325 MG tablet Take 650 mg by mouth every 6 (six) hours as needed.      . Calcium Carbonate-Vitamin D (CALTRATE 600+D) 600-400 MG-UNIT per tablet Take 1 tablet by mouth daily.      . fluconazole (DIFLUCAN) 150 MG tablet Take one tablet today, may repeat once in 7 days if rash is not resolved.  2 tablet  0  . losartan (COZAAR) 50 MG tablet Take 1 tablet (50 mg total) by mouth daily.  90 tablet  0  . nystatin (NYSTOP) 100000 UNIT/GM POWD  Apply twice daily beneath breasts until rash is resolved  30 g  2  . valACYclovir (VALTREX) 500 MG tablet TAKE ONE TABLET BY MOUTH TWICE DAILY FOR 3 DAYS.  START AT FIRST SIGN OF BREAKOUT  6 tablet  2  . DISCONTD: amLODipine (NORVASC) 5 MG tablet Take 5 mg by mouth daily.          BP 150/86  Pulse 73  Temp 98 F (36.7 C) (Oral)  Resp 16  Ht 4\' 11"  (1.499 m)  Wt 134 lb 1.3 oz (60.818 kg)  BMI 27.08 kg/m2  SpO2 98%  LMP 08/19/1983    Objective:   Vision: see nursing Hearing: able to hear forced whisper at 6 feet Body mass index:see vitals Cognitive Impairment Assessment: cognition, memory and judgment appear normal.   Assessment:   Medicare wellness preventive parameters   Due for dexa and pap- pt refuses at this time.  Plan:    During the course of the visit the patient was educated and counseled about appropriate screening and preventive services including:    Screening mammography  Vaccines / LABS Obtain fasting labs.  Vaccines up to date Patient Instructions (the written plan) was given to the patient.      Review of Systems  Constitutional: Negative for unexpected weight change.  HENT: Negative for hearing loss and rhinorrhea.   Eyes: Negative for visual disturbance.  Respiratory: Negative for cough and shortness of breath.   Cardiovascular: Negative for chest pain.  Gastrointestinal: Negative for nausea, vomiting and diarrhea.  Genitourinary: Negative for dysuria and vaginal discharge.  Musculoskeletal: Positive for back pain.  Skin: Negative for rash.  Neurological: Negative for headaches.  Hematological: Negative for adenopathy.  Psychiatric/Behavioral:       Denies depression/anxiety       Objective:   Physical Exam  Physical Exam  Constitutional: She is oriented to person, place, and time. She appears well-developed and well-nourished. No distress.  HENT:  Head: Normocephalic and atraumatic.  Right Ear: Tympanic membrane and ear canal normal.    Left Ear: Tympanic membrane and ear canal normal.  Mouth/Throat: Oropharynx is clear and moist.  Eyes: Pupils are equal, round, and reactive to light. No scleral icterus.  Neck: Normal range of motion. No thyromegaly present.  Cardiovascular: Normal rate and regular rhythm.   No murmur heard. Pulmonary/Chest: Effort normal and breath sounds normal. No respiratory distress. He has no wheezes. She has no rales. She exhibits no tenderness.  Abdominal: Soft. Bowel sounds are normal. He exhibits no distension and no mass. There is no tenderness. There is no rebound and no guarding.  Musculoskeletal: She exhibits no edema.  Lymphadenopathy:    She has no cervical adenopathy.  Neurological: She is alert and oriented to person, place, and time. She exhibits normal muscle tone. Coordination normal.  Skin: Skin is warm and dry.  Psychiatric: She has a normal mood and affect. Her behavior is normal. Judgment and thought content normal.  Breasts: Examined lying Right: Without masses, retractions, discharge or axillary adenopathy.  Left: Without masses, retractions, discharge or axillary adenopathy.        Assessment & Plan:         Assessment & Plan:  Today- Pap, dexa, fasting lab work.

## 2012-02-25 NOTE — Telephone Encounter (Signed)
Reviewed x-ray C spine- DDD with pt. Also reviewed carotid calcifications with pt.  Will add baby aspirin once daily and send for carotid doppler. Pt is agreeable.

## 2012-02-25 NOTE — Assessment & Plan Note (Signed)
Check A1C. Advised pt to check her sugars twice daily while on medrol and call if sugar >300.

## 2012-02-25 NOTE — Assessment & Plan Note (Signed)
Symptoms have worsened. Now with tingling in hands bilaterally. Trial of medrol dose pak.

## 2012-02-25 NOTE — Assessment & Plan Note (Addendum)
Resolved.  Had workup- neg.  Plan 1 yr follow up screening mammo 6/14.

## 2012-02-25 NOTE — Assessment & Plan Note (Signed)
Obtain bmet 

## 2012-02-25 NOTE — Patient Instructions (Addendum)
Please schedule pap in the end of September. Call if your neck pain/back pain worsens, or if no improvement in 1 week.  Call if sugar >300.

## 2012-02-25 NOTE — Assessment & Plan Note (Signed)
·   Obtain FLP

## 2012-02-26 ENCOUNTER — Telehealth: Payer: Self-pay | Admitting: Family

## 2012-02-26 DIAGNOSIS — R9431 Abnormal electrocardiogram [ECG] [EKG]: Secondary | ICD-10-CM

## 2012-02-26 LAB — URINALYSIS, ROUTINE W REFLEX MICROSCOPIC
Hgb urine dipstick: NEGATIVE
Ketones, ur: NEGATIVE mg/dL
Leukocytes, UA: NEGATIVE
Nitrite: NEGATIVE
Protein, ur: NEGATIVE mg/dL
Urobilinogen, UA: 0.2 mg/dL (ref 0.0–1.0)
pH: 6 (ref 5.0–8.0)

## 2012-02-26 LAB — CBC WITH DIFFERENTIAL/PLATELET
Basophils Absolute: 0 10*3/uL (ref 0.0–0.1)
Basophils Relative: 1 % (ref 0–1)
Eosinophils Absolute: 0.1 10*3/uL (ref 0.0–0.7)
Hemoglobin: 11.9 g/dL — ABNORMAL LOW (ref 12.0–15.0)
MCH: 30.3 pg (ref 26.0–34.0)
MCHC: 32.7 g/dL (ref 30.0–36.0)
Monocytes Relative: 8 % (ref 3–12)
Neutro Abs: 2.2 10*3/uL (ref 1.7–7.7)
Neutrophils Relative %: 41 % — ABNORMAL LOW (ref 43–77)
RDW: 14 % (ref 11.5–15.5)

## 2012-02-26 NOTE — Telephone Encounter (Signed)
Pls call pt and let her know that kidney function is stable.  Diabetes stable.  Cholesterol almost at goal. Keep working on low fat/low cholesterol diet.  Anemia is improved from last year.   I reviewed EKG and compared to old EKG.  EKG appears changed since that time.  In addition to the carotid artery test, I would like for her to have a stress test. (pended) Both of these tests will be performed at [facility].

## 2012-03-01 NOTE — Telephone Encounter (Signed)
Left message for pt to return my call.

## 2012-03-01 NOTE — Telephone Encounter (Signed)
Pt returned my call and was notified of results below. Pt voices understanding and is agreeable to proceed with Stress test. Pt will be out of town until 03/05/12 but can do test after that date.

## 2012-03-02 ENCOUNTER — Encounter: Payer: Self-pay | Admitting: Family

## 2012-03-02 DIAGNOSIS — R9431 Abnormal electrocardiogram [ECG] [EKG]: Secondary | ICD-10-CM | POA: Insufficient documentation

## 2012-03-05 ENCOUNTER — Encounter (INDEPENDENT_AMBULATORY_CARE_PROVIDER_SITE_OTHER): Payer: Medicare Other

## 2012-03-05 DIAGNOSIS — I708 Atherosclerosis of other arteries: Secondary | ICD-10-CM

## 2012-03-05 DIAGNOSIS — R0989 Other specified symptoms and signs involving the circulatory and respiratory systems: Secondary | ICD-10-CM

## 2012-03-08 ENCOUNTER — Telehealth: Payer: Self-pay | Admitting: Family

## 2012-03-08 ENCOUNTER — Encounter: Payer: Self-pay | Admitting: Family

## 2012-03-08 DIAGNOSIS — I6529 Occlusion and stenosis of unspecified carotid artery: Secondary | ICD-10-CM

## 2012-03-08 HISTORY — DX: Occlusion and stenosis of unspecified carotid artery: I65.29

## 2012-03-08 NOTE — Telephone Encounter (Signed)
Reviewed Carotid artery duplex results with pt.  Needs follow up duplex in 1 year.  Work hard on low fat/low cholesterol diet, exercise.  Add baby aspirin 81 mg daily.  Pt is aware.

## 2012-03-09 ENCOUNTER — Ambulatory Visit (HOSPITAL_COMMUNITY): Payer: Medicare Other | Attending: Cardiovascular Disease | Admitting: Radiology

## 2012-03-09 VITALS — BP 110/70 | Ht 59.0 in | Wt 134.0 lb

## 2012-03-09 DIAGNOSIS — E119 Type 2 diabetes mellitus without complications: Secondary | ICD-10-CM | POA: Insufficient documentation

## 2012-03-09 DIAGNOSIS — I4949 Other premature depolarization: Secondary | ICD-10-CM

## 2012-03-09 DIAGNOSIS — R9431 Abnormal electrocardiogram [ECG] [EKG]: Secondary | ICD-10-CM | POA: Insufficient documentation

## 2012-03-09 MED ORDER — REGADENOSON 0.4 MG/5ML IV SOLN
0.4000 mg | Freq: Once | INTRAVENOUS | Status: AC
  Administered 2012-03-09: 0.4 mg via INTRAVENOUS

## 2012-03-09 MED ORDER — TECHNETIUM TC 99M TETROFOSMIN IV KIT
11.0000 | PACK | Freq: Once | INTRAVENOUS | Status: AC | PRN
  Administered 2012-03-09: 11 via INTRAVENOUS

## 2012-03-09 MED ORDER — TECHNETIUM TC 99M TETROFOSMIN IV KIT
33.0000 | PACK | Freq: Once | INTRAVENOUS | Status: AC | PRN
  Administered 2012-03-09: 33 via INTRAVENOUS

## 2012-03-09 NOTE — Progress Notes (Signed)
[facility] SITE 3 NUCLEAR MED [address] [place] [address] [phone]  Cardiology Nuclear Med Study  Brittany Robles is a 71 y.o. female     MRN : [id number]     DOB: May 20, 1941  Procedure Date: 03/09/2012  Nuclear Med Background Indication for Stress Test:  Evaluation for Ischemia and Abnormal EKG History:  CKD Stage II DDP Cardiac Risk Factors: History of Smoking, Hypertension, Lipids and NIDDM  Symptoms:  Palpitations   Nuclear Pre-Procedure Caffeine/Decaff Intake:  None NPO After: 8:30pm   Lungs:  clear O2 Sat: 95% on room air. IV 0.9% NS with Angio Cath:  22g  IV Site: R Antecubital  IV Started by:  Stanton Kidney, EMT-P  Chest Size (in):  38 Cup Size: D  Height: 4\' 11"  (1.499 m)  Weight:  134 lb (60.782 kg)  BMI:  Body mass index is 27.06 kg/(m^2). Tech Comments:  NA    Nuclear Med Study 1 or 2 day study: 1 day  Stress Test Type:  Lexiscan  Reading MD: Kristeen Miss, MD  Order Authorizing Provider:  Sandford Craze NP Charlynn Court MD  Resting Radionuclide: Technetium 32m Tetrofosmin  Resting Radionuclide Dose: 10.6 mCi   Stress Radionuclide:  Technetium 50m Tetrofosmin  Stress Radionuclide Dose: 32.8 mCi           Stress Protocol Rest HR: 90 Stress HR: 106  Rest BP: 110/70 Stress BP: 122/66  Exercise Time (min): n/a METS: n/a   Predicted Max HR: 150 bpm % Max HR: 70.67 bpm Rate Pressure Product: 62952   Dose of Adenosine (mg):  n/a Dose of Lexiscan: 0.4 mg  Dose of Atropine (mg): n/a Dose of Dobutamine: n/a mcg/kg/min (at max HR)  Stress Test Technologist: Milana Na, EMT-P  Nuclear Technologist:  Domenic Polite, CNMT     Rest Procedure:  Myocardial perfusion imaging was performed at rest 45 minutes following the intravenous administration of Technetium 84m Tetrofosmin. Rest ECG: NSR - Normal EKG  Stress Procedure:  The patient received IV Lexiscan 0.4 mg over 15-seconds.  Technetium 11m Tetrofosmin injected at  30-seconds.  There were no significant changes, dizziness, warm in her chest, and freq pvcs/pacs with Lexiscan.  Quantitative spect images were obtained after a 45 minute delay. Stress ECG: No significant change from baseline ECG  QPS Raw Data Images:  Normal; no motion artifact; normal heart/lung ratio. Stress Images:  Normal homogeneous uptake in all areas of the myocardium. Rest Images:  Normal homogeneous uptake in all areas of the myocardium. Subtraction (SDS):  Normal Transient Ischemic Dilatation (Normal <1.22):  0.98  Lung/Heart Ratio (Normal <0.45):  0.35  Quantitative Gated Spect Images QGS EDV:  n/a QGS ESV:  n/a  Impression Exercise Capacity:  Lexiscan with no exercise. BP Response:  Hypotensive blood pressure response. Clinical Symptoms:  No significant symptoms noted. ECG Impression:  No significant ST segment change suggestive of ischemia. Comparison with Prior Nuclear Study: No images to compare  Overall Impression:  Normal stress nuclear study.   No evidence of ischemia.  Normal LV function.   LV Ejection Fraction: Study not gated.  LV Wall Motion:  Study not gated.    [name], [name], MD, [facility] 03/09/2012, 4:40 PM Office - [phone] Pager [phone]

## 2012-03-10 ENCOUNTER — Telehealth: Payer: Self-pay | Admitting: Family

## 2012-03-10 NOTE — Telephone Encounter (Signed)
Please call pt and let her know that her stress test is normal.  

## 2012-05-10 ENCOUNTER — Ambulatory Visit (INDEPENDENT_AMBULATORY_CARE_PROVIDER_SITE_OTHER): Payer: Medicare Other | Admitting: Family

## 2012-05-10 ENCOUNTER — Ambulatory Visit: Payer: Medicare Other | Admitting: Family

## 2012-05-10 ENCOUNTER — Encounter: Payer: Self-pay | Admitting: Family

## 2012-05-10 VITALS — BP 114/70 | HR 77 | Temp 97.7°F | Resp 16 | Ht 59.0 in | Wt 136.1 lb

## 2012-05-10 DIAGNOSIS — Z23 Encounter for immunization: Secondary | ICD-10-CM

## 2012-05-10 DIAGNOSIS — I10 Essential (primary) hypertension: Secondary | ICD-10-CM

## 2012-05-10 DIAGNOSIS — N182 Chronic kidney disease, stage 2 (mild): Secondary | ICD-10-CM

## 2012-05-10 DIAGNOSIS — E119 Type 2 diabetes mellitus without complications: Secondary | ICD-10-CM

## 2012-05-10 DIAGNOSIS — M5030 Other cervical disc degeneration, unspecified cervical region: Secondary | ICD-10-CM

## 2012-05-10 NOTE — Assessment & Plan Note (Signed)
BP Readings from Last 3 Encounters:  05/10/12 114/70  03/09/12 110/70  02/25/12 150/86  BP stable.  Continue losartan and amlodipine.

## 2012-05-10 NOTE — Assessment & Plan Note (Signed)
We discussed trying to obtain MRI of neck and or back and referral to neurosurgeon.  She declines.  I have advised her to continue her PT exercises and call if she develops weakness or worsening numbness. Pt verbalizes understanding.

## 2012-05-10 NOTE — Progress Notes (Signed)
Subjective:    Patient ID: Brittany Robles, female    DOB: 05-05-1941, 71 y.o.   MRN: [id number]  HPI  [name] is a 71 yr old female who presents today for follow up.  1)Neck pain/back pain- She was given medrol dose pak last visit and reports that she continues to have some tingling in her arms.  X-ray showed degenerative disc disease of the cervical spine.  She reports that she has been following with Dr. Wyline Mood for her back pain and was referred for physical therapy. She reports that it was recommended that she have an ESI but she declines invasive treatment at this time.   2) galactorrhea right breast- pt had complete work up (neg) Denies further breast drainage. Recommendation was for 1 yr follow up mammogram.    3) HTN- continues current medications without problems.  Denies CP, sob, swelling.    Review of Systems    see HPI  Past Medical History  Diagnosis Date  . Carpal tunnel syndrome, bilateral   . Hypertension   . Spinal stenosis of lumbar region   . Diabetes mellitus     Type 2  . Bowel obstruction   . 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 POLYCYSTIC KIDNEY DISEASE 10/11/2009  . Carotid artery stenosis 03/08/2012    Bilateral- 40-50% per duplex 7/13.  Needs follow up duplex in 1 year.     History   Social History  . Marital Status: Divorced    Spouse Name: N/A    Number of Children: 3  . Years of Education: N/A   Occupational History  . Retired    Social History Main Topics  . Smoking status: Former Smoker    Quit date: 04/18/2010  . Smokeless tobacco: Not on file  . Alcohol Use: Not on file  . Drug Use: Not on file  . Sexually Active: Not on file   Other Topics Concern  . 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 Small intestine surgery     for bowel obstruction  . Appendectomy   . Spine surgery     lumbar  spinal stenosis  . Cesarean section     x 2  . Carpal tunnel release     Family History  Problem Relation Age of Onset  . Cancer Other     Lung    Allergies  Allergen Reactions  . Ibuprofen   . Lisinopril     Current Outpatient Prescriptions on File Prior to Visit  Medication Sig Dispense Refill  . acetaminophen (TYLENOL) 325 MG tablet Take 650 mg by mouth every 6 (six) hours as needed.      Marland Kitchen amLODipine (NORVASC) 5 MG tablet Take 1 tablet (5 mg total) by mouth daily.  30 tablet  3  . aspirin EC 81 MG tablet Take 81 mg by mouth daily.      . Calcium Carbonate-Vitamin D (CALTRATE 600+D) 600-400 MG-UNIT per tablet Take 1 tablet by mouth daily.      . fluconazole (DIFLUCAN) 150 MG tablet Take one tablet today, may repeat once in 7 days if rash is not resolved.  2 tablet  0  . losartan (COZAAR) 50 MG tablet Take 1 tablet (50 mg total) by mouth daily.  90 tablet  0  . nystatin (NYSTOP) 100000 UNIT/GM POWD Apply twice daily beneath breasts until  rash is resolved  30 g  2  . valACYclovir (VALTREX) 500 MG tablet TAKE ONE TABLET BY MOUTH TWICE DAILY FOR 3 DAYS.  START AT FIRST SIGN OF BREAKOUT  6 tablet  2    BP 114/70  Pulse 77  Temp 97.7 F (36.5 C) (Oral)  Resp 16  Ht 4\' 11"  (1.499 m)  Wt 136 lb 1.9 oz (61.744 kg)  BMI 27.49 kg/m2  SpO2 99%  LMP 08/19/1983    Objective:   Physical Exam  Constitutional: She is oriented to person, place, and time. She appears well-developed and well-nourished. No distress.  HENT:  Head: Normocephalic and atraumatic.  Cardiovascular: Normal rate and regular rhythm.   No murmur heard. Pulmonary/Chest: Effort normal and breath sounds normal. No respiratory distress. She has no wheezes. She has no rales. She exhibits no tenderness.  Musculoskeletal: She exhibits no edema.  Neurological: She is alert and oriented to person, place, and time.  Psychiatric: She has a normal mood and affect. Her behavior is normal. Judgment and thought content  normal.          Assessment & Plan:

## 2012-05-10 NOTE — Patient Instructions (Addendum)
Please schedule a follow up appointment in 3 months. Sooner if problems or concerns.

## 2012-05-10 NOTE — Assessment & Plan Note (Signed)
Last A1C July was at goal: 6.3.  Plan to repeat next visit.

## 2012-05-10 NOTE — Assessment & Plan Note (Signed)
+   hx of polycystic kidney disease.  Last creatinine July was stable at 1.36.  Plan to repeat next visit.  Reminded pt to avoid nsaids.

## 2012-05-17 ENCOUNTER — Other Ambulatory Visit: Payer: Self-pay | Admitting: Family

## 2012-05-18 NOTE — Telephone Encounter (Signed)
Done/SLS 

## 2012-05-21 ENCOUNTER — Other Ambulatory Visit: Payer: Self-pay | Admitting: Family

## 2012-05-21 NOTE — Telephone Encounter (Signed)
Losartan refill sent to pharmacy on 05/17/12 #90 x no refills. Denial sent to pharmacy with note to use refill on file.

## 2012-06-08 ENCOUNTER — Other Ambulatory Visit (HOSPITAL_COMMUNITY)
Admission: RE | Admit: 2012-06-08 | Discharge: 2012-06-08 | Disposition: A | Discharge status: Home / Self Care | Payer: Medicare Other | Source: Ambulatory Visit | Attending: Family | Admitting: Family

## 2012-06-08 ENCOUNTER — Encounter: Payer: Self-pay | Admitting: Family

## 2012-06-08 ENCOUNTER — Ambulatory Visit (INDEPENDENT_AMBULATORY_CARE_PROVIDER_SITE_OTHER): Payer: Medicare Other | Admitting: Family

## 2012-06-08 VITALS — BP 114/80 | HR 64 | Temp 97.9°F | Resp 16 | Ht 59.0 in | Wt 138.1 lb

## 2012-06-08 DIAGNOSIS — M5430 Sciatica, unspecified side: Secondary | ICD-10-CM | POA: Insufficient documentation

## 2012-06-08 DIAGNOSIS — Z0000 Encounter for general adult medical examination without abnormal findings: Secondary | ICD-10-CM

## 2012-06-08 DIAGNOSIS — Z124 Encounter for screening for malignant neoplasm of cervix: Secondary | ICD-10-CM | POA: Insufficient documentation

## 2012-06-08 DIAGNOSIS — Z1231 Encounter for screening mammogram for malignant neoplasm of breast: Secondary | ICD-10-CM

## 2012-06-08 DIAGNOSIS — Z01419 Encounter for gynecological examination (general) (routine) without abnormal findings: Secondary | ICD-10-CM | POA: Insufficient documentation

## 2012-06-08 MED ORDER — TRAMADOL HCL 50 MG PO TABS: 50.0000 mg | ORAL_TABLET | Freq: Three times a day (TID) | ORAL | Status: DC | PRN

## 2012-06-08 NOTE — Assessment & Plan Note (Signed)
Trial of ultram- to be used sparingly.  Recommend use of tylenol first.  We are unable to use nsaids due to hx of PCKD.

## 2012-06-08 NOTE — Progress Notes (Signed)
Subjective:    Patient ID: Brittany Robles, female    DOB: [birth year], 71 y.o.   MRN: [id number]  HPI  Ms.  Brittany Robles is a 71 yr old female who presents  Today for GYN exam. She reports that her last pap was 5/12 and performed by GYN. She reports that her pap was normal.    Low back pain radiates down both buttocks- has been present for weeks.  She has tried tylenol, no improvement. She reports that she did some PT. No improvement. Reports previous attempts at steroids have not helped. She denies associated lower extremity numbness or weakness.   Review of Systems    see HPI  Past Medical History  Diagnosis Date  . Carpal tunnel syndrome, bilateral   . Hypertension   . Spinal stenosis of lumbar region   . Diabetes mellitus     Type 2  . Bowel obstruction   . 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 POLYCYSTIC KIDNEY DISEASE 10/11/2009  . Carotid artery stenosis 03/08/2012    Bilateral- 40-50% per duplex 7/13.  Needs follow up duplex in 1 year.     History   Social History  . Marital Status: Divorced    Spouse Name: N/A    Number of Children: 3  . Years of Education: N/A   Occupational History  . Retired    Social History Main Topics  . Smoking status: Former Smoker    Quit date: 04/18/2010  . Smokeless tobacco: Not on file  . Alcohol Use: Not on file  . Drug Use: Not on file  . Sexually Active: Not on file   Other Topics Concern  . 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 Small intestine surgery     for bowel obstruction  . Appendectomy   . Spine surgery     lumbar spinal stenosis  . Cesarean section     x 2  . Carpal tunnel release     Family History  Problem Relation Age of Onset  . Cancer Other     Lung    Allergies  Allergen Reactions  . Ibuprofen   . Lisinopril     Current Outpatient Prescriptions on File Prior  to Visit  Medication Sig Dispense Refill  . acetaminophen (TYLENOL) 325 MG tablet Take 650 mg by mouth every 6 (six) hours as needed.      Marland Kitchen amLODipine (NORVASC) 5 MG tablet Take 1 tablet (5 mg total) by mouth daily.  30 tablet  3  . aspirin EC 81 MG tablet Take 81 mg by mouth daily.      . Calcium Carbonate-Vitamin D (CALTRATE 600+D) 600-400 MG-UNIT per tablet Take 1 tablet by mouth daily.      . fluconazole (DIFLUCAN) 150 MG tablet Take one tablet today, may repeat once in 7 days if rash is not resolved.  2 tablet  0  . losartan (COZAAR) 50 MG tablet Take 1 tablet (50 mg total) by mouth daily.  90 tablet  0  . nystatin (NYSTOP) 100000 UNIT/GM POWD Apply twice daily beneath breasts until rash is resolved  30 g  2  . valACYclovir (VALTREX) 500 MG tablet TAKE ONE TABLET BY MOUTH TWICE DAILY FOR 3 DAYS.  START AT FIRST SIGN OF BREAKOUT  6 tablet  2    BP 114/80  Pulse 64  Temp  97.9 F (36.6 C) (Oral)  Resp 16  Ht 4\' 11"  (1.499 m)  Wt 138 lb 1.3 oz (62.633 kg)  BMI 27.89 kg/m2  SpO2 94%  LMP 08/19/1983    Objective:   Physical Exam  Constitutional: She appears well-developed and well-nourished. No distress.  Genitourinary:       Inguinal/mons: Normal without inguinal adenopathy  External genitalia: Normal  BUS/Urethra/Skene's glands: Normal  Bladder: Normal  Vagina: Normal  Cervix: Normal  Uterus: normal in size, shape and contour. Midline and mobile  Adnexa/parametria:  Rt: Without masses or tenderness.  Lt: Without masses or tenderness.  Anus and perineum: Normal            Assessment & Plan:

## 2012-06-08 NOTE — Addendum Note (Signed)
Addended by: Mervin Kung A on: 06/08/2012 02:45 PM   Modules accepted: Orders

## 2012-06-08 NOTE — Patient Instructions (Addendum)
Please schedule a follow up appointment in 3 months.

## 2012-06-08 NOTE — Assessment & Plan Note (Signed)
Pap performed today. 

## 2012-06-11 ENCOUNTER — Encounter: Payer: Self-pay | Admitting: Family

## 2012-08-04 ENCOUNTER — Other Ambulatory Visit: Payer: Self-pay | Admitting: Family

## 2012-08-04 NOTE — Telephone Encounter (Signed)
Rx to pharmacy/SLS 

## 2012-08-09 ENCOUNTER — Telehealth: Payer: Self-pay | Admitting: Family

## 2012-08-09 DIAGNOSIS — Z0000 Encounter for general adult medical examination without abnormal findings: Secondary | ICD-10-CM

## 2012-08-09 NOTE — Telephone Encounter (Signed)
Pt unavailable; spouse informed to forward message/SLS

## 2012-08-09 NOTE — Telephone Encounter (Signed)
Pls call pt and let her know that I reviewed her records and see that she is due for screening mammogram.  I have placed order.

## 2012-08-13 ENCOUNTER — Encounter: Payer: Self-pay | Admitting: Family

## 2012-08-13 ENCOUNTER — Ambulatory Visit (INDEPENDENT_AMBULATORY_CARE_PROVIDER_SITE_OTHER): Payer: Medicare Other | Admitting: Family

## 2012-08-13 ENCOUNTER — Ambulatory Visit: Payer: Medicare Other | Admitting: Family

## 2012-08-13 VITALS — BP 106/78 | HR 60 | Temp 98.2°F | Resp 14 | Wt 131.5 lb

## 2012-08-13 DIAGNOSIS — S161XXA Strain of muscle, fascia and tendon at neck level, initial encounter: Secondary | ICD-10-CM

## 2012-08-13 DIAGNOSIS — S139XXA Sprain of joints and ligaments of unspecified parts of neck, initial encounter: Secondary | ICD-10-CM

## 2012-08-13 MED ORDER — CYCLOBENZAPRINE HCL 5 MG PO TABS: 5.0000 mg | ORAL_TABLET | Freq: Every evening | ORAL | Status: DC | PRN

## 2012-08-13 NOTE — Patient Instructions (Addendum)
Please call if symptoms worsen or if no improvement in 1 week.  

## 2012-08-13 NOTE — Assessment & Plan Note (Signed)
Will avoid NSAIDS due to polycystic kidney disease.   Recommended flexeril HS prn along with tylenol, stretching, heat prn. Call if numbness/weakness occurs or if symptoms do not improve. Pt verbalizes understanding.

## 2012-08-13 NOTE — Progress Notes (Signed)
Subjective:    Patient ID: Brittany Robles, female    DOB: 25-Aug-1940, 71 y.o.   MRN: [id number]  HPI  Pt fel against the wall when playing with grand kids 1 week ago.  She reports + neck stiffness. Right shoulder struck the wall.  "jarred her."  Denies numbness/weakness in the arms.    Review of Systems See HPI  Past Medical History  Diagnosis Date  . Carpal tunnel syndrome, bilateral   . Hypertension   . Spinal stenosis of lumbar region   . Diabetes mellitus     Type 2  . Bowel obstruction   . 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 POLYCYSTIC KIDNEY DISEASE 10/11/2009  . Carotid artery stenosis 03/08/2012    Bilateral- 40-50% per duplex 7/13.  Needs follow up duplex in 1 year.     History   Social History  . Marital Status: Divorced    Spouse Name: N/A    Number of Children: 3  . Years of Education: N/A   Occupational History  . Retired    Social History Main Topics  . Smoking status: Former Smoker    Quit date: 04/18/2010  . Smokeless tobacco: Not on file  . Alcohol Use: Not on file  . Drug Use: Not on file  . Sexually Active: Not on file   Other Topics Concern  . 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 Small intestine surgery     for bowel obstruction  . Appendectomy   . Spine surgery     lumbar spinal stenosis  . Cesarean section     x 2  . Carpal tunnel release     Family History  Problem Relation Age of Onset  . Cancer Other     Lung    Allergies  Allergen Reactions  . Ibuprofen   . Lisinopril     Current Outpatient Prescriptions on File Prior to Visit  Medication Sig Dispense Refill  . acetaminophen (TYLENOL) 325 MG tablet Take 650 mg by mouth every 6 (six) hours as needed.      Marland Kitchen amLODipine (NORVASC) 5 MG tablet One tablet by mouth daily  30 tablet  2  . aspirin EC 81 MG tablet Take 81 mg by mouth  daily.      . Calcium Carbonate-Vitamin D (CALTRATE 600+D) 600-400 MG-UNIT per tablet Take 1 tablet by mouth daily.      . fluconazole (DIFLUCAN) 150 MG tablet Take one tablet today, may repeat once in 7 days if rash is not resolved.  2 tablet  0  . losartan (COZAAR) 50 MG tablet Take 1 tablet (50 mg total) by mouth daily.  90 tablet  0  . nystatin (NYSTOP) 100000 UNIT/GM POWD Apply twice daily beneath breasts until rash is resolved  30 g  2  . traMADol (ULTRAM) 50 MG tablet Take 1 tablet (50 mg total) by mouth every 8 (eight) hours as needed for pain.  30 tablet  0  . valACYclovir (VALTREX) 500 MG tablet TAKE ONE TABLET BY MOUTH TWICE DAILY FOR 3 DAYS.  START AT FIRST SIGN OF BREAKOUT  6 tablet  2    BP 106/78  Pulse 60  Temp 98.2 F (36.8 C) (Oral)  Resp 14  Wt 131 lb 8 oz (59.648 kg)  SpO2 100%  LMP 08/19/1983  Objective:   Physical Exam  Constitutional: She appears well-developed and well-nourished. No distress.  Cardiovascular: Normal rate and regular rhythm.   No murmur heard. Pulmonary/Chest: Effort normal and breath sounds normal. No respiratory distress. She has no wheezes. She has no rales. She exhibits no tenderness.  Musculoskeletal:       + neck stiffness with flexion/extension, mild tenderness to palpation at base of neck.   Full ROM of the right shoulder.   Neurological:       Bilateral UE strength is 5/5 Bilateral LE stength is 5/5 Strong equal hand grasps.          Assessment & Plan:

## 2012-08-23 ENCOUNTER — Telehealth: Payer: Self-pay | Admitting: Family

## 2012-08-23 MED ORDER — TRAMADOL HCL 50 MG PO TABS: 50.0000 mg | ORAL_TABLET | Freq: Three times a day (TID) | ORAL | Status: DC | PRN

## 2012-08-23 NOTE — Telephone Encounter (Signed)
Patient states that she would like a refill of tramadol to be sent to [facility] on [address] in [place].  She says that she is now using that pharmacy all the time.

## 2012-08-23 NOTE — Telephone Encounter (Signed)
Refill sent.

## 2012-08-23 NOTE — Telephone Encounter (Signed)
Last refill was 06/08/12 #30.  Please advise re: future refills.

## 2012-08-23 NOTE — Telephone Encounter (Signed)
OK to send #30 with zero refills.  

## 2012-09-07 ENCOUNTER — Ambulatory Visit (INDEPENDENT_AMBULATORY_CARE_PROVIDER_SITE_OTHER): Payer: Medicare Other | Admitting: Family

## 2012-09-07 ENCOUNTER — Ambulatory Visit (HOSPITAL_BASED_OUTPATIENT_CLINIC_OR_DEPARTMENT_OTHER)
Admission: RE | Admit: 2012-09-07 | Discharge: 2012-09-07 | Disposition: A | Discharge status: Home / Self Care | Payer: Medicare Other | Source: Ambulatory Visit | Attending: Family | Admitting: Family

## 2012-09-07 ENCOUNTER — Other Ambulatory Visit: Payer: Self-pay | Admitting: Family

## 2012-09-07 ENCOUNTER — Encounter: Payer: Self-pay | Admitting: Family

## 2012-09-07 VITALS — BP 120/68 | HR 66 | Temp 98.2°F | Resp 16 | Wt 131.0 lb

## 2012-09-07 DIAGNOSIS — R200 Anesthesia of skin: Secondary | ICD-10-CM

## 2012-09-07 DIAGNOSIS — Z1231 Encounter for screening mammogram for malignant neoplasm of breast: Secondary | ICD-10-CM | POA: Insufficient documentation

## 2012-09-07 DIAGNOSIS — R209 Unspecified disturbances of skin sensation: Secondary | ICD-10-CM

## 2012-09-07 MED ORDER — LORAZEPAM 1 MG PO TABS: ORAL_TABLET | ORAL | Status: DC

## 2012-09-07 NOTE — Progress Notes (Signed)
Subjective:    Patient ID: Brittany Robles, female    DOB: [birth year], 71 y.o.   MRN: [id number]  HPI  Brittany Robles is a 72 yr old female who presents today with chief complaint of tingling of the hands.  L>R. She has hx of CTS with a left CTR.  CTS was performed in 2011 by Dr. Hilda Lias.  She reports that since that time she has continuous tingling in her hands.  She reports that the left hand stays cold.  She reports that when she moves her head forward, the fingers of both hands tingle.  Review of Systems See HPI  Past Medical History  Diagnosis Date  . Carpal tunnel syndrome, bilateral   . Hypertension   . Spinal stenosis of lumbar region   . Diabetes mellitus     Type 2  . Bowel obstruction   . 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 POLYCYSTIC KIDNEY DISEASE 10/11/2009  . Carotid artery stenosis 03/08/2012    Bilateral- 40-50% per duplex 7/13.  Needs follow up duplex in 1 year.     History   Social History  . Marital Status: Divorced    Spouse Name: N/A    Number of Children: 3  . Years of Education: N/A   Occupational History  . Retired    Social History Main Topics  . Smoking status: Former Smoker    Quit date: 04/18/2010  . Smokeless tobacco: Not on file  . Alcohol Use: Not on file  . Drug Use: Not on file  . Sexually Active: Not on file   Other Topics Concern  . 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 Small intestine surgery     for bowel obstruction  . Appendectomy   . Spine surgery     lumbar spinal stenosis  . Cesarean section     x 2  . Carpal tunnel release     Family History  Problem Relation Age of Onset  . Cancer Other     Lung    Allergies  Allergen Reactions  . Ibuprofen   . Lisinopril     Current Outpatient Prescriptions on File Prior to Visit  Medication Sig Dispense Refill  . acetaminophen  (TYLENOL) 325 MG tablet Take 650 mg by mouth every 6 (six) hours as needed.      Marland Kitchen amLODipine (NORVASC) 5 MG tablet One tablet by mouth daily  30 tablet  2  . aspirin EC 81 MG tablet Take 81 mg by mouth daily.      . Calcium Carbonate-Vitamin D (CALTRATE 600+D) 600-400 MG-UNIT per tablet Take 1 tablet by mouth daily.      . cyclobenzaprine (FLEXERIL) 5 MG tablet Take 1 tablet (5 mg total) by mouth at bedtime as needed for muscle spasms.  15 tablet  0  . fluconazole (DIFLUCAN) 150 MG tablet Take one tablet today, may repeat once in 7 days if rash is not resolved.  2 tablet  0  . losartan (COZAAR) 50 MG tablet Take 1 tablet (50 mg total) by mouth daily.  90 tablet  0  . nystatin (NYSTOP) 100000 UNIT/GM POWD Apply twice daily beneath breasts until rash is resolved  30 g  2  . traMADol (ULTRAM) 50 MG tablet Take 1 tablet (50 mg total) by mouth every 8 (eight) hours as needed for pain.  30 tablet  0  . valACYclovir (VALTREX) 500 MG tablet TAKE ONE TABLET BY MOUTH TWICE DAILY FOR 3 DAYS.  START AT FIRST SIGN OF BREAKOUT  6 tablet  2    BP 120/68  Pulse 66  Temp 98.2 F (36.8 C) (Oral)  Resp 16  Wt 131 lb 0.6 oz (59.439 kg)  LMP 08/19/1983       Objective:   Physical Exam  Constitutional: She is oriented to person, place, and time. She appears well-developed and well-nourished. No distress.  Cardiovascular: Normal rate and regular rhythm.   No murmur heard. Pulmonary/Chest: Effort normal and breath sounds normal. No respiratory distress. She has no wheezes. She has no rales. She exhibits no tenderness.  Neurological: She is alert and oriented to person, place, and time.       Bilateral hand grasp 5/5, bilateral UE strenght 5/5. + tinels + phalans right- both neg on left.           Assessment & Plan:

## 2012-09-07 NOTE — Patient Instructions (Addendum)
You will be contacted about your MRI. Please purchase a left wrist splint and wear at night.  Follow up in 1 month.

## 2012-09-10 DIAGNOSIS — R200 Anesthesia of skin: Secondary | ICD-10-CM | POA: Insufficient documentation

## 2012-09-10 NOTE — Assessment & Plan Note (Signed)
My suspicion is that she may have superimposed cervical disc disease on her CTS.  Obtain MRI C spine.  Pending these results, consider referral to neurology. In the meantime, I have recommended that she purchase and were cock up wrist splint to the left wrist.

## 2012-09-11 ENCOUNTER — Ambulatory Visit (HOSPITAL_BASED_OUTPATIENT_CLINIC_OR_DEPARTMENT_OTHER): Admission: RE | Admit: 2012-09-11 | Payer: Medicare Other | Source: Ambulatory Visit

## 2012-09-18 ENCOUNTER — Ambulatory Visit (HOSPITAL_BASED_OUTPATIENT_CLINIC_OR_DEPARTMENT_OTHER)
Admission: RE | Admit: 2012-09-18 | Discharge: 2012-09-18 | Disposition: A | Discharge status: Home / Self Care | Payer: Medicare Other | Source: Ambulatory Visit | Attending: Family | Admitting: Family

## 2012-09-18 DIAGNOSIS — R200 Anesthesia of skin: Secondary | ICD-10-CM

## 2012-09-18 DIAGNOSIS — R209 Unspecified disturbances of skin sensation: Secondary | ICD-10-CM | POA: Insufficient documentation

## 2012-09-18 DIAGNOSIS — M542 Cervicalgia: Secondary | ICD-10-CM | POA: Insufficient documentation

## 2012-09-18 DIAGNOSIS — M4802 Spinal stenosis, cervical region: Secondary | ICD-10-CM | POA: Insufficient documentation

## 2012-09-20 ENCOUNTER — Telehealth: Payer: Self-pay | Admitting: Family

## 2012-09-20 DIAGNOSIS — M5090 Cervical disc disorder, unspecified, unspecified cervical region: Secondary | ICD-10-CM

## 2012-09-20 MED ORDER — METHYLPREDNISOLONE 4 MG PO KIT: PACK | ORAL | Status: DC

## 2012-09-20 NOTE — Telephone Encounter (Signed)
Notified pt. She states she just came back from the neurosurgeon and will be completing some additional testing and then will proceed with surgery. Pt requests that Rx be sent to [facility] on [address]. Rx sent to [facility] [address] and cancelled at [facility].

## 2012-09-20 NOTE — Telephone Encounter (Signed)
Reviewed MRI C-spine.  Notes severe multilevel cervical spondylosis with severe central stenosis C3-C4.  Will plan to refer to Neurosurgery for further evaluation.   Attempted to reach patient- no answer.  Please try her later today and let her know that her MRI shows severe arthritis in her neck and some narrowing of the central canal of her spine which is placing pressure on her spinal cord.  I would like her to take a medrol dose pak and refer her to neurosurgery.  A referral has been placed.

## 2012-09-23 ENCOUNTER — Telehealth: Payer: Self-pay | Admitting: *Deleted

## 2012-09-23 NOTE — Telephone Encounter (Signed)
Spoke with pt, she states her neurosurgeon told her she would need some testing completed on her hands before proceeding with neck surgery and no one has contacted her to arrange testing yet. Advised pt to contact neurosurgeon and she reports that she has done this. Advised pt to give them a little longer to respond or call them back if no one returns her call.

## 2012-09-23 NOTE — Telephone Encounter (Signed)
Received message from pt that she would like someone to call her about test results from her specialist. Attempted to reach pt and left message for her to return my call.

## 2012-09-24 ENCOUNTER — Telehealth: Payer: Self-pay | Admitting: *Deleted

## 2012-09-24 NOTE — Telephone Encounter (Signed)
Received message from pt that she thinks she is having side effects to prednisone.  Attempted to reach pt and was told that pt is currently sleeping. Spoke with pt's niece, Elenora Fender and was told that pt took first dose of Prednisone this morning. Shortly after that pt broke out into a sweat, BP dropped to 60/51 and heart rate was 140. EMS was called and told pt they think the prednisone may have caused symptoms. Pt refused transportation to the ER and denied other symptoms. Per verbal from Provider, advised pt's niece to stop prednisone and have pt continue Tramadol as pt cannot take anti-inflammatories due to the effects on her kidneys. Advised her to have pt evaluated in the ER if she experiences any further episodes.

## 2012-09-27 ENCOUNTER — Telehealth: Payer: Self-pay | Admitting: Family

## 2012-09-27 ENCOUNTER — Other Ambulatory Visit: Payer: Self-pay | Admitting: Family

## 2012-09-27 MED ORDER — LOSARTAN POTASSIUM 50 MG PO TABS: 50.0000 mg | ORAL_TABLET | Freq: Every day | ORAL | Status: DC

## 2012-09-27 NOTE — Telephone Encounter (Signed)
Refill sent to pharmacy.   

## 2012-09-27 NOTE — Telephone Encounter (Signed)
Refill- cozaar 50mg  tab. Take one tablet by mouth every day. Qty 90 last fill 10.1.13

## 2012-10-26 ENCOUNTER — Encounter: Payer: Self-pay | Admitting: Family

## 2012-10-26 ENCOUNTER — Ambulatory Visit (INDEPENDENT_AMBULATORY_CARE_PROVIDER_SITE_OTHER): Payer: Medicare Other | Admitting: Family

## 2012-10-26 VITALS — BP 110/80 | HR 89 | Temp 97.8°F | Resp 16 | Ht 59.0 in | Wt 135.1 lb

## 2012-10-26 DIAGNOSIS — M5090 Cervical disc disorder, unspecified, unspecified cervical region: Secondary | ICD-10-CM | POA: Insufficient documentation

## 2012-10-26 NOTE — Assessment & Plan Note (Signed)
15 minutes spent with pt today.  >50% of this time was spent counseling pt on her upcoming surgery.  We discussed possible risk associated with cervical surgery, but also risks associated with long term spinal cord compression.She wishes to proceed with surgical recommendation.  Support provided.

## 2012-10-26 NOTE — Patient Instructions (Addendum)
Please follow up in 3 months, sooner if problems/concerns.  

## 2012-10-26 NOTE — Progress Notes (Signed)
  Subjective:    Patient ID: Brittany Robles, female    DOB: [birth year], 72 y.o.   MRN: [id number]  HPI  Brittany Robles is a 72 yr old female who presents today to discuss neurosurgical recommendations for a procedure on her C-spine.  She underwent an MRI of the C-spine which noted severe multilevel cervical spondylosis, with severe central stenosis at C3-C4. 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 She continues to have numbness in her fingers.      Review of Systems     Objective:   Physical Exam  Constitutional: She appears well-developed and well-nourished. No distress.  Psychiatric:  Tearful upon discussion of her upcoming procedure.           Assessment & Plan:

## 2012-10-29 ENCOUNTER — Telehealth: Payer: Self-pay | Admitting: *Deleted

## 2012-10-29 NOTE — Telephone Encounter (Signed)
Left message with female family member to notify pt that I am returning her phone call.

## 2012-10-29 NOTE — Telephone Encounter (Signed)
Received message from pt requesting call back from Provider re: upcoming procedure.

## 2012-11-11 ENCOUNTER — Telehealth: Payer: Self-pay | Admitting: Family

## 2012-11-11 NOTE — Telephone Encounter (Signed)
Left message on machine  for pt to call me back if she has any further questions.

## 2012-12-22 ENCOUNTER — Telehealth: Payer: Self-pay | Admitting: *Deleted

## 2012-12-22 NOTE — Telephone Encounter (Signed)
Received message from pt requesting refill of valacyclovir be sent to [facility] in [place], [place] as she is visiting family for a while.  Please advise.

## 2012-12-23 MED ORDER — VALACYCLOVIR HCL 500 MG PO TABS: ORAL_TABLET | ORAL | Status: DC

## 2012-12-23 NOTE — Telephone Encounter (Signed)
Refill sent, pt notified

## 2012-12-23 NOTE — Telephone Encounter (Signed)
Ok to send

## 2013-02-02 ENCOUNTER — Encounter: Payer: Self-pay | Admitting: Family

## 2013-02-02 ENCOUNTER — Ambulatory Visit (INDEPENDENT_AMBULATORY_CARE_PROVIDER_SITE_OTHER): Payer: Medicare Other | Admitting: Family

## 2013-02-02 VITALS — BP 148/82 | HR 85 | Temp 98.1°F | Wt 133.8 lb

## 2013-02-02 DIAGNOSIS — I10 Essential (primary) hypertension: Secondary | ICD-10-CM

## 2013-02-02 DIAGNOSIS — M5090 Cervical disc disorder, unspecified, unspecified cervical region: Secondary | ICD-10-CM

## 2013-02-02 DIAGNOSIS — E119 Type 2 diabetes mellitus without complications: Secondary | ICD-10-CM

## 2013-02-02 LAB — BASIC METABOLIC PANEL
BUN: 28 mg/dL — ABNORMAL HIGH (ref 6–23)
Chloride: 105 mEq/L (ref 96–112)
Creat: 1.34 mg/dL — ABNORMAL HIGH (ref 0.50–1.10)
Glucose, Bld: 124 mg/dL — ABNORMAL HIGH (ref 70–99)
Potassium: 4.9 mEq/L (ref 3.5–5.3)

## 2013-02-02 LAB — HEMOGLOBIN A1C: Mean Plasma Glucose: 134 mg/dL — ABNORMAL HIGH (ref <117)

## 2013-02-02 NOTE — Assessment & Plan Note (Signed)
She will arrange surgical procedure in the near future. Management per Neurosurgery.

## 2013-02-02 NOTE — Progress Notes (Signed)
Subjective:    Patient ID: Brittany Robles, female    DOB: 1940/12/01, 72 y.o.   MRN: [id number]  HPI  Brittany Robles is a 72 yr old female who presents today for follow up.   DM2- this has been diet controlled.    HTN- she is maintained on amlodipine and losartan.   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  Neurosurgery has recommended surgical procedure. She has decided to proceed with surgery, but wishes to wait until she completes some legal issues she is dealing with in [place]. She reports that her only symptoms is occasional tingling in her fingers and some chronic intermittent low back pain which she does not wish to pursue further evaluation of at this time.    Review of Systems See HPI  Past Medical History  Diagnosis Date  . Carpal tunnel syndrome, bilateral   . Hypertension   . Spinal stenosis of lumbar region   . Diabetes mellitus     Type 2  . Bowel obstruction   . 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 POLYCYSTIC KIDNEY DISEASE 10/11/2009  . Carotid artery stenosis 03/08/2012    Bilateral- 40-50% per duplex 7/13.  Needs follow up duplex in 1 year.     History   Social History  . Marital Status: Divorced    Spouse Name: N/A    Number of Children: 3  . Years of Education: N/A   Occupational History  . Retired    Social History Main Topics  . Smoking status: Former Smoker    Quit date: 04/18/2010  . Smokeless tobacco: Not on file  . Alcohol Use: Not on file  . Drug Use: Not on file  . Sexually Active: Not on file   Other Topics Concern  . 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 Small intestine surgery      for bowel obstruction  . Appendectomy    . Spine surgery      lumbar spinal stenosis  . Cesarean  section      x 2  . Carpal tunnel release      Family History  Problem Relation Age of Onset  . Cancer Other     Lung    Allergies  Allergen Reactions  . Ibuprofen   . Lisinopril   . Medrol (Methylprednisolone)     Near syncope, low BP    Current Outpatient Prescriptions on File Prior to Visit  Medication Sig Dispense Refill  . acetaminophen (TYLENOL) 325 MG tablet Take 650 mg by mouth every 6 (six) hours as needed.      Marland Kitchen amLODipine (NORVASC) 5 MG tablet One tablet by mouth daily  30 tablet  2  . aspirin EC 81 MG tablet Take 81 mg by mouth daily.      . Calcium Carbonate-Vitamin D (CALTRATE 600+D) 600-400 MG-UNIT per tablet Take 1 tablet by mouth daily.      Marland Kitchen LORazepam (ATIVAN) 1 MG tablet One tablet by mouth, 30 minute prior to to MRI.  20 tablet  1  . losartan (COZAAR) 50 MG tablet Take 1 tablet (50 mg total) by mouth daily.  90 tablet  0  . nystatin (NYSTOP) 100000 UNIT/GM POWD  Apply twice daily beneath breasts until rash is resolved  30 g  2  . valACYclovir (VALTREX) 500 MG tablet TAKE ONE TABLET BY MOUTH TWICE DAILY FOR 3 DAYS **START  AT  FIRST  SIGN  OF  BREAKOUT**  6 tablet  1   No current facility-administered medications on file prior to visit.    BP 148/82  Pulse 85  Temp(Src) 98.1 F (36.7 C) (Oral)  Wt 133 lb 12.8 oz (60.691 kg)  BMI 27.01 kg/m2  SpO2 99%  LMP 08/19/1983       Objective:   Physical Exam  Constitutional: She is oriented to person, place, and time. She appears well-developed and well-nourished. No distress.  HENT:  Head: Atraumatic.  Cardiovascular: Normal rate and regular rhythm.   No murmur heard. Pulmonary/Chest: Effort normal and breath sounds normal. No respiratory distress. She has no wheezes. She has no rales. She exhibits no tenderness.  Musculoskeletal: She exhibits no edema.  Bilateral hand grasps/UE strength is 5/5  Neurological: She is alert and oriented to person, place, and time.  Psychiatric: She has a normal mood  and affect. Her behavior is normal. Judgment and thought content normal.          Assessment & Plan:

## 2013-02-02 NOTE — Assessment & Plan Note (Signed)
Clinically stable.  Obtain A1C.  

## 2013-02-02 NOTE — Patient Instructions (Addendum)
Please complete your lab work prior to leaving. Please schedule a follow up appointment in 3 months.  

## 2013-02-02 NOTE — Assessment & Plan Note (Signed)
Blood pressure is stable, continue current meds. Obtain bmet.

## 2013-02-03 LAB — MICROALBUMIN / CREATININE URINE RATIO: Microalb Creat Ratio: 4.6 mg/g (ref 0.0–30.0)

## 2013-02-05 ENCOUNTER — Encounter: Payer: Self-pay | Admitting: Family

## 2013-02-09 ENCOUNTER — Telehealth: Payer: Self-pay | Admitting: Family

## 2013-02-09 NOTE — Telephone Encounter (Signed)
Patient states that she would like a referral to Dr. Jeral Fruit regarding her neck issues.

## 2013-02-09 NOTE — Telephone Encounter (Signed)
Since she saw him in February for consultation- she can contact his office and arrange a follow up appointment.  No referral needed.

## 2013-02-10 ENCOUNTER — Telehealth: Payer: Self-pay | Admitting: Family

## 2013-02-10 NOTE — Telephone Encounter (Signed)
Patient returned phone call and she states that she will call Dr. Cassandria Santee office.

## 2013-02-10 NOTE — Telephone Encounter (Signed)
Attempted to reach pt and was unable to leave message. Dr Jeral Fruit, ph) [phone]

## 2013-02-10 NOTE — Telephone Encounter (Signed)
Opened in error

## 2013-02-23 ENCOUNTER — Other Ambulatory Visit: Payer: Self-pay | Admitting: Neurosurgery

## 2013-02-24 ENCOUNTER — Other Ambulatory Visit: Payer: Self-pay

## 2013-03-01 ENCOUNTER — Encounter (HOSPITAL_COMMUNITY): Payer: Self-pay | Admitting: Pharmacy Technician

## 2013-03-03 ENCOUNTER — Encounter (HOSPITAL_COMMUNITY)
Admission: RE | Admit: 2013-03-03 | Discharge: 2013-03-03 | Disposition: A | Discharge status: Home / Self Care | Payer: Medicare Other | Source: Ambulatory Visit | Attending: Neurosurgery | Admitting: Neurosurgery

## 2013-03-03 ENCOUNTER — Encounter (HOSPITAL_COMMUNITY): Payer: Self-pay

## 2013-03-03 DIAGNOSIS — Z01812 Encounter for preprocedural laboratory examination: Secondary | ICD-10-CM | POA: Insufficient documentation

## 2013-03-03 DIAGNOSIS — Z01811 Encounter for preprocedural respiratory examination: Secondary | ICD-10-CM | POA: Insufficient documentation

## 2013-03-03 DIAGNOSIS — Z01810 Encounter for preprocedural cardiovascular examination: Secondary | ICD-10-CM | POA: Insufficient documentation

## 2013-03-03 DIAGNOSIS — Z01818 Encounter for other preprocedural examination: Secondary | ICD-10-CM | POA: Insufficient documentation

## 2013-03-03 HISTORY — DX: Other specified postprocedural states: Z98.890

## 2013-03-03 HISTORY — DX: Other specified postprocedural states: R11.2

## 2013-03-03 LAB — BASIC METABOLIC PANEL
BUN: 24 mg/dL — ABNORMAL HIGH (ref 6–23)
CO2: 24 mEq/L (ref 19–32)
Chloride: 103 mEq/L (ref 96–112)
GFR calc non Af Amer: 40 mL/min — ABNORMAL LOW (ref >90)
Glucose, Bld: 118 mg/dL — ABNORMAL HIGH (ref 70–99)
Potassium: 4.5 mEq/L (ref 3.5–5.1)
Sodium: 137 mEq/L (ref 135–145)

## 2013-03-03 LAB — CBC
HCT: 37.5 % (ref 36.0–46.0)
Hemoglobin: 13.1 g/dL (ref 12.0–15.0)
MCH: 31 pg (ref 26.0–34.0)
MCHC: 34.9 g/dL (ref 30.0–36.0)
MCV: 88.7 fL (ref 78.0–100.0)
RBC: 4.23 MIL/uL (ref 3.87–5.11)

## 2013-03-03 LAB — SURGICAL PCR SCREEN
MRSA, PCR: NEGATIVE
Staphylococcus aureus: NEGATIVE

## 2013-03-03 NOTE — Pre-Procedure Instructions (Signed)
WITNEY HUIE  03/03/2013   Your procedure is scheduled on:  03/08/13  Report to [facility] at 530 AM.  Call this number if you have problems the morning of surgery: [phone]   Remember:   Do not eat food or drink liquids after midnight.   Take these medicines the morning of surgery with A SIP OF WATER: norvasc,tylenol   Do not wear jewelry, make-up or nail polish.  Do not wear lotions, powders, or perfumes. You may wear deodorant.  Do not shave 48 hours prior to surgery. Men may shave face and neck.  Do not bring valuables to the hospital.  [facility] is not responsible                   for any belongings or valuables.  Contacts, dentures or bridgework may not be worn into surgery.  Leave suitcase in the car. After surgery it may be brought to your room.  For patients admitted to the hospital, checkout time is 11:00 AM the day of  discharge.   Patients discharged the day of surgery will not be allowed to drive  home.  Name and phone number of your driver: family  Special Instructions: Incentive Spirometry - Practice and bring it with you on the day of surgery.   Please read over the following fact sheets that you were given: Pain Booklet, Coughing and Deep Breathing, MRSA Information and Surgical Site Infection Prevention

## 2013-03-04 NOTE — Progress Notes (Signed)
Anesthesia Chart Review:  Patient is a 72 year old female scheduled for C3-4, C4-5, C5-6, C6-7 ACDF on 03/03/13 by Dr. Jeral Fruit.  History includes former smoker, post-operative N/V, HTN, DM2, polycystic kidney disease, carotid artery stenosis (40-50% bilateral ICAS by 03/05/12 duplex), SBO s/p surgery, lumbar stenosis with history of back surgery.  PCP is listed as Dr. Danise Edge (with most recent visits with Sandford Craze, NP).   EKG on 03/03/13 showed SR with PACs with aberrant conduction, possible LAE.  Nuclear stress test on 03/09/12 showed: Normal stress nuclear study. No evidence of ischemia. Normal LV function. LV Ejection Fraction: Study not gated. LV Wall Motion: Study not gated.  MRI of the abdomen on 01/15/11 showed: 1. Numerous bilateral renal masses. Technically indeterminate without IV contrast. Most likely cysts and complex cysts as detailed above. No markedly enlarged or highly suspicious lesion identified.  2. No acute abdominal process.   CXR on 03/03/13 showed no worrisome focal or acute cardiopulmonary abnormality noted.   Preoperative labs noted. A1C on 02/02/13 was 6.3.  Anticipate that she can proceed as planned.  Velna Ochs [facility]/Anesthesiology Phone [phone] 03/04/2013 11:25 AM ]

## 2013-03-14 MED ORDER — CEFAZOLIN SODIUM-DEXTROSE 2-3 GM-% IV SOLR
2.0000 g | INTRAVENOUS | Status: AC
  Administered 2013-03-15: 2 g via INTRAVENOUS
  Filled 2013-03-14: qty 50

## 2013-03-15 ENCOUNTER — Encounter (HOSPITAL_COMMUNITY): Payer: Self-pay | Admitting: *Deleted

## 2013-03-15 ENCOUNTER — Encounter (HOSPITAL_COMMUNITY)
Admission: RE | Disposition: A | Discharge status: Home Health | Payer: Self-pay | Source: Ambulatory Visit | Attending: Neurosurgery

## 2013-03-15 ENCOUNTER — Inpatient Hospital Stay (HOSPITAL_COMMUNITY): Payer: Medicare Other

## 2013-03-15 ENCOUNTER — Inpatient Hospital Stay (HOSPITAL_COMMUNITY)
Admission: RE | Admit: 2013-03-15 | Discharge: 2013-03-18 | DRG: 472 | Disposition: A | Discharge status: Home Health | Payer: Medicare Other | Source: Ambulatory Visit | Attending: Neurosurgery | Admitting: Neurosurgery

## 2013-03-15 ENCOUNTER — Ambulatory Visit (HOSPITAL_COMMUNITY): Payer: Medicare Other | Admitting: Anesthesiology

## 2013-03-15 ENCOUNTER — Encounter (HOSPITAL_COMMUNITY): Payer: Self-pay | Admitting: Vascular Surgery

## 2013-03-15 DIAGNOSIS — Z888 Allergy status to other drugs, medicaments and biological substances status: Secondary | ICD-10-CM

## 2013-03-15 DIAGNOSIS — I10 Essential (primary) hypertension: Secondary | ICD-10-CM | POA: Diagnosis present

## 2013-03-15 DIAGNOSIS — Z7982 Long term (current) use of aspirin: Secondary | ICD-10-CM

## 2013-03-15 DIAGNOSIS — G988 Other disorders of nervous system: Secondary | ICD-10-CM | POA: Diagnosis present

## 2013-03-15 DIAGNOSIS — Z9089 Acquired absence of other organs: Secondary | ICD-10-CM

## 2013-03-15 DIAGNOSIS — Z801 Family history of malignant neoplasm of trachea, bronchus and lung: Secondary | ICD-10-CM

## 2013-03-15 DIAGNOSIS — Q613 Polycystic kidney, unspecified: Secondary | ICD-10-CM

## 2013-03-15 DIAGNOSIS — Z886 Allergy status to analgesic agent status: Secondary | ICD-10-CM

## 2013-03-15 DIAGNOSIS — I6529 Occlusion and stenosis of unspecified carotid artery: Secondary | ICD-10-CM | POA: Diagnosis present

## 2013-03-15 DIAGNOSIS — Z79899 Other long term (current) drug therapy: Secondary | ICD-10-CM

## 2013-03-15 DIAGNOSIS — I658 Occlusion and stenosis of other precerebral arteries: Secondary | ICD-10-CM | POA: Diagnosis present

## 2013-03-15 DIAGNOSIS — M4712 Other spondylosis with myelopathy, cervical region: Principal | ICD-10-CM | POA: Diagnosis present

## 2013-03-15 DIAGNOSIS — E119 Type 2 diabetes mellitus without complications: Secondary | ICD-10-CM | POA: Diagnosis present

## 2013-03-15 DIAGNOSIS — Z87891 Personal history of nicotine dependence: Secondary | ICD-10-CM

## 2013-03-15 HISTORY — PX: ANTERIOR CERVICAL DECOMPRESSION/DISCECTOMY FUSION 4 LEVELS: SHX5556

## 2013-03-15 LAB — GLUCOSE, CAPILLARY: Glucose-Capillary: 117 mg/dL — ABNORMAL HIGH (ref 70–99)

## 2013-03-15 SURGERY — ANTERIOR CERVICAL DECOMPRESSION/DISCECTOMY FUSION 4 LEVELS
Anesthesia: General | Site: Neck | Wound class: Clean

## 2013-03-15 MED ORDER — ARTIFICIAL TEARS OP OINT
TOPICAL_OINTMENT | OPHTHALMIC | Status: DC | PRN
  Administered 2013-03-15: 1 via OPHTHALMIC

## 2013-03-15 MED ORDER — EPHEDRINE SULFATE 50 MG/ML IJ SOLN
INTRAMUSCULAR | Status: DC | PRN
  Administered 2013-03-15: 10 mg via INTRAVENOUS

## 2013-03-15 MED ORDER — OXYCODONE-ACETAMINOPHEN 5-325 MG PO TABS
ORAL_TABLET | ORAL | Status: AC
  Filled 2013-03-15: qty 2

## 2013-03-15 MED ORDER — DIAZEPAM 5 MG PO TABS
ORAL_TABLET | ORAL | Status: AC
  Filled 2013-03-15: qty 1

## 2013-03-15 MED ORDER — ONDANSETRON HCL 4 MG/2ML IJ SOLN: 4.0000 mg | INTRAMUSCULAR | Status: DC | PRN

## 2013-03-15 MED ORDER — PHENYLEPHRINE HCL 10 MG/ML IJ SOLN
INTRAMUSCULAR | Status: DC | PRN
  Administered 2013-03-15 (×4): 80 ug via INTRAVENOUS

## 2013-03-15 MED ORDER — AMLODIPINE BESYLATE 5 MG PO TABS
5.0000 mg | ORAL_TABLET | Freq: Every day | ORAL | Status: DC
  Administered 2013-03-16 – 2013-03-18 (×3): 5 mg via ORAL
  Filled 2013-03-15 (×4): qty 1

## 2013-03-15 MED ORDER — ACETAMINOPHEN 325 MG PO TABS: 650.0000 mg | ORAL_TABLET | ORAL | Status: DC | PRN

## 2013-03-15 MED ORDER — CEFAZOLIN SODIUM 1-5 GM-% IV SOLN
1.0000 g | Freq: Three times a day (TID) | INTRAVENOUS | Status: AC
  Administered 2013-03-15 – 2013-03-16 (×2): 1 g via INTRAVENOUS
  Filled 2013-03-15 (×2): qty 50

## 2013-03-15 MED ORDER — HYDROMORPHONE HCL PF 1 MG/ML IJ SOLN
INTRAMUSCULAR | Status: AC
  Filled 2013-03-15: qty 1

## 2013-03-15 MED ORDER — SODIUM CHLORIDE 0.9 % IJ SOLN: 3.0000 mL | INTRAMUSCULAR | Status: DC | PRN

## 2013-03-15 MED ORDER — PHENOL 1.4 % MT LIQD
1.0000 | OROMUCOSAL | Status: DC | PRN
  Administered 2013-03-16: 1 via OROMUCOSAL
  Filled 2013-03-15: qty 177

## 2013-03-15 MED ORDER — NEOSTIGMINE METHYLSULFATE 1 MG/ML IJ SOLN
INTRAMUSCULAR | Status: DC | PRN
  Administered 2013-03-15: 4 mg via INTRAVENOUS

## 2013-03-15 MED ORDER — THROMBIN 20000 UNITS EX SOLR
CUTANEOUS | Status: DC | PRN
  Administered 2013-03-15: 10:00:00 via TOPICAL

## 2013-03-15 MED ORDER — PROPOFOL 10 MG/ML IV BOLUS
INTRAVENOUS | Status: DC | PRN
  Administered 2013-03-15: 150 mg via INTRAVENOUS

## 2013-03-15 MED ORDER — SODIUM CHLORIDE 0.9 % IV SOLN
INTRAVENOUS | Status: DC
  Administered 2013-03-15: 18:00:00 via INTRAVENOUS

## 2013-03-15 MED ORDER — ALBUMIN HUMAN 5 % IV SOLN
INTRAVENOUS | Status: DC | PRN
  Administered 2013-03-15: 10:00:00 via INTRAVENOUS

## 2013-03-15 MED ORDER — ZOLPIDEM TARTRATE 5 MG PO TABS
5.0000 mg | ORAL_TABLET | Freq: Every evening | ORAL | Status: DC | PRN
  Administered 2013-03-16 – 2013-03-17 (×3): 5 mg via ORAL
  Filled 2013-03-15 (×3): qty 1

## 2013-03-15 MED ORDER — LOSARTAN POTASSIUM 50 MG PO TABS
50.0000 mg | ORAL_TABLET | Freq: Every day | ORAL | Status: DC
  Administered 2013-03-16 – 2013-03-18 (×3): 50 mg via ORAL
  Filled 2013-03-15 (×4): qty 1

## 2013-03-15 MED ORDER — ACETAMINOPHEN 650 MG RE SUPP: 650.0000 mg | RECTAL | Status: DC | PRN

## 2013-03-15 MED ORDER — ONDANSETRON HCL 4 MG/2ML IJ SOLN
INTRAMUSCULAR | Status: DC | PRN
  Administered 2013-03-15: 4 mg via INTRAVENOUS

## 2013-03-15 MED ORDER — SODIUM CHLORIDE 0.9 % IV SOLN: 250.0000 mL | INTRAVENOUS | Status: DC

## 2013-03-15 MED ORDER — LIDOCAINE HCL (CARDIAC) 20 MG/ML IV SOLN
INTRAVENOUS | Status: DC | PRN
  Administered 2013-03-15: 100 mg via INTRAVENOUS

## 2013-03-15 MED ORDER — OXYCODONE-ACETAMINOPHEN 5-325 MG PO TABS
1.0000 | ORAL_TABLET | ORAL | Status: DC | PRN
  Administered 2013-03-15 – 2013-03-18 (×14): 2 via ORAL
  Filled 2013-03-15 (×13): qty 2

## 2013-03-15 MED ORDER — SODIUM CHLORIDE 0.9 % IJ SOLN
3.0000 mL | Freq: Two times a day (BID) | INTRAMUSCULAR | Status: DC
  Administered 2013-03-15 – 2013-03-18 (×6): 3 mL via INTRAVENOUS

## 2013-03-15 MED ORDER — ROCURONIUM BROMIDE 100 MG/10ML IV SOLN
INTRAVENOUS | Status: DC | PRN
  Administered 2013-03-15: 10 mg via INTRAVENOUS
  Administered 2013-03-15: 50 mg via INTRAVENOUS
  Administered 2013-03-15: 10 mg via INTRAVENOUS

## 2013-03-15 MED ORDER — GLYCOPYRROLATE 0.2 MG/ML IJ SOLN
INTRAMUSCULAR | Status: DC | PRN
  Administered 2013-03-15: .6 mg via INTRAVENOUS

## 2013-03-15 MED ORDER — MIDAZOLAM HCL 5 MG/5ML IJ SOLN
INTRAMUSCULAR | Status: DC | PRN
  Administered 2013-03-15 (×2): 1 mg via INTRAVENOUS

## 2013-03-15 MED ORDER — VALACYCLOVIR HCL 500 MG PO TABS
500.0000 mg | ORAL_TABLET | Freq: Two times a day (BID) | ORAL | Status: DC
  Administered 2013-03-15 – 2013-03-18 (×6): 500 mg via ORAL
  Filled 2013-03-15 (×8): qty 1

## 2013-03-15 MED ORDER — LACTATED RINGERS IV SOLN
INTRAVENOUS | Status: DC
  Administered 2013-03-15: 08:00:00 via INTRAVENOUS

## 2013-03-15 MED ORDER — LACTATED RINGERS IV SOLN
INTRAVENOUS | Status: DC | PRN
  Administered 2013-03-15 (×3): via INTRAVENOUS

## 2013-03-15 MED ORDER — THROMBIN 5000 UNITS EX SOLR
OROMUCOSAL | Status: DC | PRN
  Administered 2013-03-15: 10:00:00 via TOPICAL

## 2013-03-15 MED ORDER — 0.9 % SODIUM CHLORIDE (POUR BTL) OPTIME
TOPICAL | Status: DC | PRN
  Administered 2013-03-15: 1000 mL

## 2013-03-15 MED ORDER — MENTHOL 3 MG MT LOZG
1.0000 | LOZENGE | OROMUCOSAL | Status: DC | PRN
Start: 2013-03-15 — End: 2013-03-18
  Administered 2013-03-15: 3 mg via ORAL
  Filled 2013-03-15: qty 9

## 2013-03-15 MED ORDER — FENTANYL CITRATE 0.05 MG/ML IJ SOLN
INTRAMUSCULAR | Status: DC | PRN
  Administered 2013-03-15 (×3): 50 ug via INTRAVENOUS
  Administered 2013-03-15: 100 ug via INTRAVENOUS

## 2013-03-15 MED ORDER — DIAZEPAM 5 MG PO TABS
5.0000 mg | ORAL_TABLET | Freq: Four times a day (QID) | ORAL | Status: DC | PRN
  Administered 2013-03-15: 5 mg via ORAL

## 2013-03-15 MED ORDER — MORPHINE SULFATE 2 MG/ML IJ SOLN: 1.0000 mg | INTRAMUSCULAR | Status: DC | PRN

## 2013-03-15 MED ORDER — HYDROMORPHONE HCL PF 1 MG/ML IJ SOLN
0.2500 mg | INTRAMUSCULAR | Status: DC | PRN
  Administered 2013-03-15 (×4): 0.5 mg via INTRAVENOUS

## 2013-03-15 SURGICAL SUPPLY — 64 items
BANDAGE GAUZE ELAST BULKY 4 IN (GAUZE/BANDAGES/DRESSINGS) ×4 IMPLANT
BENZOIN TINCTURE PRP APPL 2/3 (GAUZE/BANDAGES/DRESSINGS) ×2 IMPLANT
BIT DRILL SM SPINE QC 12 (BIT) ×2 IMPLANT
BLADE ULTRA TIP 2M (BLADE) ×2 IMPLANT
BUR BARREL STRAIGHT FLUTE 4.0 (BURR) IMPLANT
BUR MATCHSTICK NEURO 3.0 LAGG (BURR) ×2 IMPLANT
CANISTER SUCTION 2500CC (MISCELLANEOUS) ×2 IMPLANT
CLOTH BEACON ORANGE TIMEOUT ST (SAFETY) ×2 IMPLANT
CONT SPEC 4OZ CLIKSEAL STRL BL (MISCELLANEOUS) ×2 IMPLANT
COVER MAYO STAND STRL (DRAPES) ×2 IMPLANT
DRAIN SNY 10X20 3/4 PERF (WOUND CARE) ×2 IMPLANT
DRAPE C-ARM 42X72 X-RAY (DRAPES) ×4 IMPLANT
DRAPE LAPAROTOMY 100X72 PEDS (DRAPES) ×2 IMPLANT
DRAPE MICROSCOPE LEICA (MISCELLANEOUS) ×2 IMPLANT
DRAPE POUCH INSTRU U-SHP 10X18 (DRAPES) ×2 IMPLANT
DRAPE PROXIMA HALF (DRAPES) ×2 IMPLANT
DRSG OPSITE POSTOP 3X4 (GAUZE/BANDAGES/DRESSINGS) ×2 IMPLANT
DURAPREP 6ML APPLICATOR 50/CS (WOUND CARE) ×2 IMPLANT
ELECT REM PT RETURN 9FT ADLT (ELECTROSURGICAL) ×2
ELECTRODE REM PT RTRN 9FT ADLT (ELECTROSURGICAL) ×1 IMPLANT
EVACUATOR SILICONE 100CC (DRAIN) ×2 IMPLANT
GAUZE SPONGE 4X4 16PLY XRAY LF (GAUZE/BANDAGES/DRESSINGS) IMPLANT
GLOVE BIO SURGEON STRL SZ 6.5 (GLOVE) ×2 IMPLANT
GLOVE BIOGEL M 8.0 STRL (GLOVE) ×4 IMPLANT
GLOVE BIOGEL PI IND STRL 7.0 (GLOVE) ×1 IMPLANT
GLOVE BIOGEL PI IND STRL 8.5 (GLOVE) ×1 IMPLANT
GLOVE BIOGEL PI INDICATOR 7.0 (GLOVE) ×1
GLOVE BIOGEL PI INDICATOR 8.5 (GLOVE) ×1
GLOVE ECLIPSE 7.5 STRL STRAW (GLOVE) ×2 IMPLANT
GLOVE EXAM NITRILE LRG STRL (GLOVE) IMPLANT
GLOVE EXAM NITRILE MD LF STRL (GLOVE) ×2 IMPLANT
GLOVE EXAM NITRILE XL STR (GLOVE) IMPLANT
GLOVE EXAM NITRILE XS STR PU (GLOVE) IMPLANT
GLOVE SURG SS PI 8.0 STRL IVOR (GLOVE) ×6 IMPLANT
GOWN BRE IMP SLV AUR LG STRL (GOWN DISPOSABLE) IMPLANT
GOWN BRE IMP SLV AUR XL STRL (GOWN DISPOSABLE) IMPLANT
GOWN STRL REIN 2XL LVL4 (GOWN DISPOSABLE) IMPLANT
HEAD HALTER (SOFTGOODS) ×2 IMPLANT
HEMOSTAT POWDER KIT SURGIFOAM (HEMOSTASIS) ×2 IMPLANT
HEMOSTAT POWDER SURGIFOAM 1G (HEMOSTASIS) ×2 IMPLANT
KIT BASIN OR (CUSTOM PROCEDURE TRAY) ×2 IMPLANT
KIT ROOM TURNOVER OR (KITS) ×2 IMPLANT
NEEDLE SPNL 22GX3.5 QUINCKE BK (NEEDLE) ×4 IMPLANT
NS IRRIG 1000ML POUR BTL (IV SOLUTION) ×2 IMPLANT
PACK LAMINECTOMY NEURO (CUSTOM PROCEDURE TRAY) ×2 IMPLANT
PATTIES SURGICAL .5 X1 (DISPOSABLE) ×2 IMPLANT
PLATE ANT CERV XTEND 4 LV 63 (Plate) ×2 IMPLANT
PUTTY DBX 1CC (Putty) ×2 IMPLANT
PUTTY DBX 1CC DEPUY (Putty) ×1 IMPLANT
RUBBERBAND STERILE (MISCELLANEOUS) ×4 IMPLANT
SCREW VAR 4.2 XD SELF DRILL 12 (Screw) ×2 IMPLANT
SCREW XTD VAR 4.2 SELF TAP 12 (Screw) ×18 IMPLANT
SPACER ACDF SM LORDOTIC 7 (Spacer) ×6 IMPLANT
SPACER COLONIAL SZ 6-7 (Spacer) ×2 IMPLANT
SPONGE GAUZE 4X4 12PLY (GAUZE/BANDAGES/DRESSINGS) ×2 IMPLANT
SPONGE INTESTINAL PEANUT (DISPOSABLE) ×4 IMPLANT
SPONGE SURGIFOAM ABS GEL 100 (HEMOSTASIS) ×2 IMPLANT
STRIP CLOSURE SKIN 1/2X4 (GAUZE/BANDAGES/DRESSINGS) ×2 IMPLANT
SUT VIC AB 3-0 SH 8-18 (SUTURE) ×2 IMPLANT
SYR 20ML ECCENTRIC (SYRINGE) ×2 IMPLANT
TAPE CLOTH SURG 4X10 WHT LF (GAUZE/BANDAGES/DRESSINGS) ×2 IMPLANT
TOWEL OR 17X24 6PK STRL BLUE (TOWEL DISPOSABLE) ×2 IMPLANT
TOWEL OR 17X26 10 PK STRL BLUE (TOWEL DISPOSABLE) ×2 IMPLANT
WATER STERILE IRR 1000ML POUR (IV SOLUTION) ×2 IMPLANT

## 2013-03-15 NOTE — Plan of Care (Signed)
Problem: Consults Goal: Diagnosis - Spinal Surgery Cervical Spine Fusion     

## 2013-03-15 NOTE — Progress Notes (Signed)
Op note [phone]

## 2013-03-15 NOTE — Preoperative (Signed)
Beta Blockers   Reason not to administer Beta Blockers:Not Applicable 

## 2013-03-15 NOTE — Progress Notes (Signed)
Doing well. waityng for a room

## 2013-03-15 NOTE — Transfer of Care (Signed)
Immediate Anesthesia Transfer of Care Note  Patient: Brittany Robles  Procedure(s) Performed: Procedure(s) with comments: Cervical Three-Four Cervical Four-Five Cervical Five-Six Cervical Six-Seven Anterior cervical decompression/diskectomy/fusion (N/A) - Cervical Three-Four Cervical Four-Five Cervical Five-Six Cervical Six-Seven Anterior cervical decompression/diskectomy/fusion  Patient Location: PACU  Anesthesia Type:General  Level of Consciousness: awake and patient cooperative  Airway & Oxygen Therapy: Patient Spontanous Breathing and Patient connected to nasal cannula oxygen  Post-op Assessment: Report given to PACU RN, Post -op Vital signs reviewed and stable and Patient moving all extremities X 4  Post vital signs: Reviewed and stable  Complications: No apparent anesthesia complications

## 2013-03-15 NOTE — Progress Notes (Signed)
Notified Dr. Randa Evens of pt's blood pressure.

## 2013-03-15 NOTE — Anesthesia Procedure Notes (Signed)
Procedure Name: Intubation Date/Time: 03/15/2013 9:34 AM Performed by: Sherie Don Pre-anesthesia Checklist: Patient identified, Emergency Drugs available, Suction available, Patient being monitored and Timeout performed Patient Re-evaluated:Patient Re-evaluated prior to inductionOxygen Delivery Method: Circle system utilized Preoxygenation: Pre-oxygenation with 100% oxygen Intubation Type: IV induction Ventilation: Mask ventilation without difficulty LMA: LMA inserted Laryngoscope Size: Mac and 3 Grade View: Grade I Tube type: Oral Tube size: 7.0 mm Number of attempts: 1 Airway Equipment and Method: Stylet Placement Confirmation: ETT inserted through vocal cords under direct vision,  positive ETCO2 and breath sounds checked- equal and bilateral Secured at: 22 cm Tube secured with: Tape Dental Injury: Teeth and Oropharynx as per pre-operative assessment

## 2013-03-15 NOTE — H&P (Signed)
Brittany Robles is an 72 y.o. female.   Chief Complaint: neck pain HPI: patient who has been complaining of neck pain with radiation to both upper extremities associated with burning sensation. She has had a complety work up which included EMG  AND ncv , BOTJ BEIBG NORMAL. A MRI OF THE CERVICAL SPINE SHOWED STENOSIS WITH EDEMA IN THE CORD.  Past Medical History  Diagnosis Date  . Carpal tunnel syndrome, bilateral   . Hypertension   . Spinal stenosis of lumbar region   . Diabetes mellitus     Type 2  . Bowel obstruction   . 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 POLYCYSTIC KIDNEY DISEASE 10/11/2009  . Carotid artery stenosis 03/08/2012    Bilateral- 40-50% per duplex 7/13.  Needs follow up duplex in 1 year.   Marland Kitchen PONV (postoperative nausea and vomiting)     Past Surgical History  Procedure Laterality Date  . Small intestine surgery      for bowel obstruction  . Appendectomy    . Spine surgery      lumbar spinal stenosis  . Cesarean section      x 2  . Carpal tunnel release    . Breast surgery      bx,   neg    Family History  Problem Relation Age of Onset  . Cancer Other     Lung   Social History:  reports that she quit smoking about 2 years ago. She does not have any smokeless tobacco history on file. She reports that  drinks alcohol. She reports that she does not use illicit drugs.  Allergies:  Allergies  Allergen Reactions  . Ibuprofen Other (See Comments)    Strange feelings and dreams  . Lisinopril Other (See Comments)    Light headed  . Medrol (Methylprednisolone)     Near syncope, low BP    Medications Prior to Admission  Medication Sig Dispense Refill  . amLODipine (NORVASC) 5 MG tablet Take 5 mg by mouth daily.      Marland Kitchen aspirin EC 81 MG tablet Take 81 mg by mouth daily.      . Calcium Carbonate-Vitamin D (CALTRATE 600+D) 600-400 MG-UNIT per tablet Take 1 tablet by mouth daily.      Marland Kitchen losartan (COZAAR) 50 MG tablet Take 1 tablet (50 mg total) by mouth daily.   90 tablet  0  . nystatin (MYCOSTATIN/NYSTOP) 100000 UNIT/GM POWD Apply 1 g topically 2 (two) times daily as needed (for rash).      . valACYclovir (VALTREX) 500 MG tablet Take 500 mg by mouth 2 (two) times daily. For 3 days, starting at first sign of break out      . acetaminophen (TYLENOL) 325 MG tablet Take 650 mg by mouth every 6 (six) hours as needed for pain or fever.         Results for orders placed during the hospital encounter of 03/15/13 (from the past 48 hour(s))  GLUCOSE, CAPILLARY     Status: Abnormal   Collection Time    03/15/13  7:19 AM      Result Value Range   Glucose-Capillary 117 (*) 70 - 99 mg/dL   No results found.  Review of Systems  Constitutional: Negative.   HENT: Positive for neck pain.   Eyes: Negative.   Respiratory: Negative.   Cardiovascular:       Arterial hypertension  Gastrointestinal: Negative.   Genitourinary: Negative.   Skin: Negative.   Neurological: Positive for sensory change  and focal weakness.  Endo/Heme/Allergies:       DM  Psychiatric/Behavioral: Negative.     Blood pressure 234/104, pulse 93, temperature 97.9 F (36.6 C), temperature source Oral, resp. rate 18, last menstrual period 08/19/1983, SpO2 100.00%. Physical Exam  Hent, nl. Neck, decrease of neck movement. Cv, nl. Lugs clear. Abdomen soft. ExtremiTIES, NL. Neuro, deltoids weakness and biceps . Sensory normal but complains of burning. Dtr, nl. Cervical mri shows stenosis severe at c34 ,45 with ddd at 56 and 67.. Edema of the cord  Assessment/Plan Decompression and fusion. She is aware of risks and benefits   Masiyah Engen M 03/15/2013, 9:11 AM

## 2013-03-15 NOTE — Anesthesia Postprocedure Evaluation (Signed)
  Anesthesia Post-op Note  Patient: Brittany Robles  Procedure(s) Performed: Procedure(s) with comments: Cervical Three-Four Cervical Four-Five Cervical Five-Six Cervical Six-Seven Anterior cervical decompression/diskectomy/fusion (N/A) - Cervical Three-Four Cervical Four-Five Cervical Five-Six Cervical Six-Seven Anterior cervical decompression/diskectomy/fusion  Patient Location: PACU  Anesthesia Type:General  Level of Consciousness: awake  Airway and Oxygen Therapy: Patient Spontanous Breathing  Post-op Pain: mild  Post-op Assessment: Post-op Vital signs reviewed  Post-op Vital Signs: Reviewed  Complications: No apparent anesthesia complications

## 2013-03-15 NOTE — Anesthesia Preprocedure Evaluation (Addendum)
Anesthesia Evaluation  Patient identified by MRN, date of birth, ID band Patient awake    Reviewed: Allergy & Precautions, H&P , NPO status , Patient's Chart, lab work & pertinent test results  History of Anesthesia Complications (+) PONV  Airway Mallampati: II      Dental  (+) Dental Advisory Given   Pulmonary neg pulmonary ROS,  breath sounds clear to auscultation        Cardiovascular hypertension, Pt. on medications + Peripheral Vascular Disease Rhythm:Regular Rate:Normal     Neuro/Psych    GI/Hepatic negative GI ROS, Neg liver ROS,   Endo/Other  diabetes, Type 1  Renal/GU      Musculoskeletal   Abdominal   Peds  Hematology   Anesthesia Other Findings   Reproductive/Obstetrics                         Anesthesia Physical Anesthesia Plan  ASA: III  Anesthesia Plan: General   Post-op Pain Management:    Induction: Intravenous  Airway Management Planned: Oral ETT  Additional Equipment:   Intra-op Plan:   Post-operative Plan: Extubation in OR  Informed Consent: I have reviewed the patients History and Physical, chart, labs and discussed the procedure including the risks, benefits and alternatives for the proposed anesthesia with the patient or authorized representative who has indicated his/her understanding and acceptance.   Dental advisory given  Plan Discussed with: CRNA, Anesthesiologist and Surgeon  Anesthesia Plan Comments:         Anesthesia Quick Evaluation

## 2013-03-16 NOTE — Progress Notes (Signed)
   CARE MANAGEMENT NOTE 03/16/2013  Patient:  Brittany Robles, Brittany Robles   Account Number:  [id number]  Date Initiated:  03/16/2013  Documentation initiated by:  Jiles Crocker  Subjective/Objective Assessment:   ADMITTED FOR SURGERY - CERVICAL SPINE FUSION     Action/Plan:   LIVES WITH FAMILY MEMBERS; CM FOLLOWING FOR DCP; AWAITING ON PT/OT EVALS FOR DISPOSITION   Anticipated DC Date:  03/22/2013   Anticipated DC Plan:  POSSIBLY HOME W HOME HEALTH SERVICES      DC Planning Services  CM consult          Status of service:  In process, will continue to follow Medicare Important Message given?  NA - LOS <3 / Initial given by admissions (If response is "NO", the following Medicare IM given date fields will be blank)  Per UR Regulation:  Reviewed for med. necessity/level of care/duration of stay  Comments:  03/16/2013- B Glennie Rodda RN,BSN,MHA

## 2013-03-16 NOTE — Evaluation (Signed)
Physical Therapy Evaluation Patient Details Name: Brittany Robles MRN: [id number] DOB: 05-Nov-1940 Today's Date: 03/16/2013 Time: 4098-1191 PT Time Calculation (min): 27 min  PT Assessment / Plan / Recommendation History of Present Illness   patient who has been complaining of neck pain with radiation to both upper extremities associated with burning sensation. She has had a complety work up which included EMG  AND ncv , BOTJ BEIBG NORMAL. A MRI OF THE CERVICAL SPINE SHOWED STENOSIS WITH EDEMA IN THE CORD.  S/P c3 to C6 diskectomy, cord decomp., foraminectomies and C3 to C7 fusion.  Clinical Impression  Pt admitted with cervical myelopathy s/p surgery as stated above.   Pt currently with functional limitations due to the deficits listed below (see PT Problem List). Have initiated cervical education and precautions.   Pt will benefit from skilled PT to increase their independence and safety with mobility to allow discharge home with available assist.      PT Assessment  Patient needs continued PT services    Follow Up Recommendations  Home health PT;Supervision for mobility/OOB    Does the patient have the potential to tolerate intense rehabilitation      Barriers to Discharge        Equipment Recommendations  None recommended by PT    Recommendations for Other Services     Frequency Min 5X/week    Precautions / Restrictions Precautions Precautions: Cervical Required Braces or Orthoses: Cervical Brace Cervical Brace: Hard collar;At all times Restrictions Weight Bearing Restrictions: No   Pertinent Vitals/Pain 7/10 pain at neck.      Mobility  Bed Mobility Bed Mobility: Rolling Right;Right Sidelying to Sit;Sitting - Scoot to Edge of Bed Rolling Right: 4: Min guard;With rail Right Sidelying to Sit: 4: Min assist;With rails;HOB flat Sitting - Scoot to Edge of Bed: 4: Min guard Details for Bed Mobility Assistance: vc's for technique, truncal assist Transfers Transfers: Sit  to Stand;Stand to Sit Sit to Stand: 4: Min assist;With upper extremity assist;From bed Stand to Sit: 4: Min guard;With upper extremity assist;To chair/3-in-1 Details for Transfer Assistance: vc's for hand placement;  Ambulation/Gait Ambulation/Gait Assistance: 4: Min guard Ambulation Distance (Feet): 15 Feet (times 2) Assistive device: None Ambulation/Gait Assistance Details: generally steady gait, but limited on eval due to dizziness from suspected low BP Gait Pattern: Step-through pattern Stairs: No Wheelchair Mobility Wheelchair Mobility: No    Exercises     PT Diagnosis: Acute pain  PT Problem List: Decreased strength;Decreased balance;Decreased mobility;Decreased knowledge of use of DME;Decreased knowledge of precautions PT Treatment Interventions: Gait training;Stair training;Functional mobility training;Therapeutic activities;Patient/family education     PT Goals(Current goals can be found in the care plan section) Acute Rehab PT Goals Patient Stated Goal: home when able PT Goal Formulation: With patient Time For Goal Achievement: 03/23/13 Potential to Achieve Goals: Good  Visit Information  Last PT Received On: 03/16/13 Assistance Needed: +1 History of Present Illness:  patient who has been complaining of neck pain with radiation to both upper extremities associated with burning sensation. She has had a complety work up which included EMG  AND ncv , BOTJ BEIBG NORMAL. A MRI OF THE CERVICAL SPINE SHOWED STENOSIS WITH EDEMA IN THE CORD.  S/P c3 to C6 diskectomy, cord decomp., foraminectomies and C3 to C7 fusion.       Prior Functioning  Home Living Family/patient expects to be discharged to:: Private residence Living Arrangements: Other relatives Available Help at Discharge: Family;Available PRN/intermittently Type of Home: House Home Access: Level entry Home  Layout: Two level;Able to live on main level with bedroom/bathroom Alternate Level Stairs-Number of Steps:  12 Home Equipment: None Prior Function Level of Independence: Independent Comments: Pt independent with driving and IADLs Communication Communication: No difficulties Dominant Hand: Right    Cognition  Cognition Arousal/Alertness: Awake/alert Behavior During Therapy: WFL for tasks assessed/performed Overall Cognitive Status: Within Functional Limits for tasks assessed    Extremity/Trunk Assessment Lower Extremity Assessment Lower Extremity Assessment: Overall WFL for tasks assessed   Balance Balance Balance Assessed: No  End of Session PT - End of Session Equipment Utilized During Treatment: Cervical collar Activity Tolerance: Patient tolerated treatment well;Other (comment) (limited by dizziness/lightheadedness) Patient left: in chair;with call bell/phone within reach Nurse Communication: Mobility status  GP     Genetta Fiero, Eliseo Gum 03/16/2013, 12:31 PM 03/16/2013  Goodell Bing, PT [phone]  (pager)

## 2013-03-16 NOTE — Progress Notes (Signed)
Patient ID: Brittany Robles, female   DOB: July 28, 1941, 72 y.o.   MRN: [id number] Doing well. No weakness, still some tingling in the arms. Drain working well.seen by [employer]

## 2013-03-16 NOTE — Op Note (Signed)
Brittany Robles, Brittany Robles                ACCOUNT NO.:  [id number]  MEDICAL RECORD NO.:  [id number]  LOCATION:  [id number]                        FACILITY:  [facility]  PHYSICIAN:  Hilda Lias, M.D.   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  Gliosis.  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  Gliosis.  PROCEDURE:  cervical 3-4, 4-5, 5-6, and C6-7 diskectomy, decompression of the spinal cord, bilateral foraminotomy, interbody fusion with cage plate from Q6-V7.  Microscope.  SURGEON:  Hilda Lias, M.D.  ASSISTANT:  Clydene Fake, M.D.  CLINICAL HISTORY:  [name] is a lady who had been seen in my office for several months complaining of neck pain with radiation to the upper extremity associated with weakness, burning sensation, both feet.  X- rays show severe stenosis from C3-C7 with already changes within the spinal cord at the level of C3-C4.  Surgery was advised.  The patient was already aware of the risks such as no improvement whatsoever, weakness, paralysis, damage to the vocal cords, hematoma, CSF leak, need of further surgery.  PROCEDURE:  The patient was taken to the OR, and after intubation, the left side of the neck was cleaned with DuraPrep.  Traction of 5 pound was made to the head.  Drapes were applied.  Then, a longitudinal incision through the skin, subcutaneous tissue, platysma was carried out.  Immediately after retraction the 1st disk we found at the level C6- 7.  We had to do our dissection of the tubular away from the carotid artery to be able to get into the level of C4-5 and C3-4.  From then on, we started 1st at the level of level 3-4 with anterior ligament was opened after we removed a large osteophyte.  Diskectomy was accomplished with removal of the  posterior ligament and decompression of the cord as well as the C4 nerve root.  The same procedure was done at the level of C4-5 with the same finding.  At the level of 5-6 and 6-7, we found the canal being narrow but mostly stenosis bilaterally.  Decompression of the cord at the level of 5-6, and 6-7 as well as the C6 and C7 nerve root was done.  Then, the endplates were drilled.  The area was irrigated.  We had to use 1 cage of 6 mm height, lordotic with autograft at the level of 3-4 and the other 3 at the level of 4-5, 5-6, 6-7 was 7 mm lordotic.  This was followed by a plate using 10 screws.  Lateral cervical spine showed good position of the plate and the screws.  The area was irrigated.  Hemostasis was done with bipolar. The drain was left in the upper cervical area and the wound was closed with Vicryl and Steri-Strips.          ______________________________ Hilda Lias, M.D.     EB/MEDQ  D:  03/15/2013  T:  03/16/2013  Job:  [id number]

## 2013-03-16 NOTE — Evaluation (Signed)
Occupational Therapy Evaluation Patient Details Name: Brittany Robles MRN: [id number] DOB: 1940/12/02 Today's Date: 03/16/2013 Time: 4098-1191 OT Time Calculation (min): 27 min  OT Assessment / Plan / Recommendation History of present illness  patient who has been complaining of neck pain with radiation to both upper extremities associated with burning sensation. She has had a complety work up which included EMG  AND ncv , BOTJ BEIBG NORMAL. A MRI OF THE CERVICAL SPINE SHOWED STENOSIS WITH EDEMA IN THE CORD.  S/P c3 to C6 diskectomy, cord decomp., foraminectomies and C3 to C7 fusion.   Clinical Impression   Patient is s/p  C3-6 cervical fusionsurgery resulting in the deficits listed below (see OT Problem List). Pt demonstrates impaired sensation bil. Hands; weakness bil. UEs and decreased knowledge of precautions.  Anticipate good progress Patient will benefit from skilled OT to increase their safety and independence with ADL and functional mobility for ADL (while adhering to their precautions) to facilitate discharge to venue listed below.      OT Assessment  Patient needs continued OT Services    Follow Up Recommendations  No OT follow up;Supervision - Intermittent    Barriers to Discharge      Equipment Recommendations  None recommended by OT    Recommendations for Other Services    Frequency  Min 2X/week    Precautions / Restrictions Precautions Precautions: Cervical Required Braces or Orthoses: Cervical Brace Cervical Brace: Hard collar;At all times Restrictions Weight Bearing Restrictions: No   Pertinent Vitals/Pain     ADL  Eating/Feeding: Modified independent Where Assessed - Eating/Feeding: Chair Grooming: Wash/dry hands;Wash/dry face;Teeth care;Min guard Where Assessed - Grooming: Supported standing Upper Body Bathing: Supervision/safety;Set up Where Assessed - Upper Body Bathing: Supported sitting;Unsupported sitting Lower Body Bathing: Moderate  assistance Where Assessed - Lower Body Bathing: Supported sit to stand Upper Body Dressing: Minimal assistance Where Assessed - Upper Body Dressing: Unsupported sitting Lower Body Dressing: Moderate assistance Where Assessed - Lower Body Dressing: Supported sit to stand Toilet Transfer: Hydrographic surveyor Method: Sit to stand;Stand Wellsite geologist: Comfort height toilet Toileting - Architect and Hygiene: Min guard Where Assessed - Engineer, mining and Hygiene: Standing Equipment Used: Rolling walker (cervical collar) Transfers/Ambulation Related to ADLs: min guard assist with RW ADL Comments: Pt instructed in cervical precautions.   Pt with complaint of dizziness when performing grooming at sink.  BP low - RN notified.  see Flow sheet for details.  Pt returned to recliner    OT Diagnosis: Generalized weakness;Acute pain  OT Problem List: Decreased strength;Impaired balance (sitting and/or standing);Decreased knowledge of use of DME or AE;Decreased knowledge of precautions;Impaired sensation;Pain OT Treatment Interventions: Self-care/ADL training;DME and/or AE instruction;Therapeutic activities;Patient/family education   OT Goals(Current goals can be found in the care plan section) Acute Rehab OT Goals Patient Stated Goal: home when able OT Goal Formulation: With patient Time For Goal Achievement: 03/23/13 Potential to Achieve Goals: Good ADL Goals Pt Will Perform Grooming: with modified independence;standing Pt Will Perform Upper Body Bathing: with supervision;sitting Pt Will Perform Lower Body Bathing: with supervision;sit to/from stand Pt Will Perform Upper Body Dressing: with supervision;sitting Pt Will Perform Lower Body Dressing: with supervision;sit to/from stand Pt Will Transfer to Toilet: with modified independence;regular height toilet;ambulating Pt Will Perform Toileting - Clothing Manipulation and hygiene: with modified  independence;sit to/from stand Pt Will Perform Tub/Shower Transfer: Tub transfer;with supervision;rolling walker;ambulating Pt/caregiver will Perform Home Exercise Program: For increased strengthening Additional ADL Goal #1: Pt will be independent  with cervical precautions during all ADLs  Visit Information  Last OT Received On: 03/16/13 Assistance Needed: +1 PT/OT Co-Evaluation/Treatment: Yes History of Present Illness:  patient who has been complaining of neck pain with radiation to both upper extremities associated with burning sensation. She has had a complety work up which included EMG  AND ncv , BOTJ BEIBG NORMAL. A MRI OF THE CERVICAL SPINE SHOWED STENOSIS WITH EDEMA IN THE CORD.  S/P c3 to C6 diskectomy, cord decomp., foraminectomies and C3 to C7 fusion.       Prior Functioning     Home Living Family/patient expects to be discharged to:: Private residence Living Arrangements: Other relatives Available Help at Discharge: Family;Available PRN/intermittently Type of Home: House Home Access: Level entry Home Layout: Two level;Able to live on main level with bedroom/bathroom Alternate Level Stairs-Number of Steps: 12 Home Equipment: None Prior Function Level of Independence: Independent Comments: Pt independent with driving and IADLs Communication Communication: No difficulties Dominant Hand: Right         Vision/Perception     Cognition  Cognition Arousal/Alertness: Awake/alert Behavior During Therapy: WFL for tasks assessed/performed Overall Cognitive Status: Within Functional Limits for tasks assessed    Extremity/Trunk Assessment Upper Extremity Assessment Upper Extremity Assessment: Generalized weakness;RUE deficits/detail;LUE deficits/detail RUE Deficits / Details: grossly 4-/5 RUE Sensation:  (pins and needles sensation Rt hand) LUE Deficits / Details: grossly 4-/5 LUE Sensation:  (pins and needles sensation Rt hand)     Mobility Bed Mobility Bed  Mobility: Rolling Right;Right Sidelying to Sit;Sitting - Scoot to Edge of Bed Rolling Right: 4: Min guard;With rail Right Sidelying to Sit: 4: Min assist;With rails;HOB flat Sitting - Scoot to Edge of Bed: 4: Min guard Details for Bed Mobility Assistance: vc's for technique, truncal assist Transfers Transfers: Sit to Stand;Stand to Sit Sit to Stand: 4: Min assist;With upper extremity assist;From bed Stand to Sit: 4: Min guard;With upper extremity assist;To chair/3-in-1 Details for Transfer Assistance: vc's for hand placement;      Exercise     Balance Balance Balance Assessed: Yes Dynamic Standing Balance Dynamic Standing - Balance Support: No upper extremity supported Dynamic Standing - Level of Assistance: 5: Stand by assistance Dynamic Standing - Balance Activities: Other (comment) (grooming at sink)   End of Session OT - End of Session Equipment Utilized During Treatment: Rolling walker;Cervical collar Activity Tolerance: Patient tolerated treatment well Patient left: in chair;with call bell/phone within reach;with nursing/sitter in room Nurse Communication: Mobility status;Other (comment) (BP)  GO     Iram Lundberg M 03/16/2013, 11:27 PM

## 2013-03-17 NOTE — Progress Notes (Signed)
Patient ID: Brittany Robles, female   DOB: 1941/08/14, 72 y.o.   MRN: [id number] Doing well.ambulating with help

## 2013-03-17 NOTE — Progress Notes (Signed)
Occupational Therapy Treatment Patient Details Name: Brittany Robles MRN: [id number] DOB: 1941/02/21 Today's Date: 03/17/2013 Time: 1010-1045 OT Time Calculation (min): 35 min  OT Assessment / Plan / Recommendation  History of present illness  patient who has been complaining of neck pain with radiation to both upper extremities associated with burning sensation. She has had a complety work up which included EMG  AND ncv , BOTJ BEIBG NORMAL. A MRI OF THE CERVICAL SPINE SHOWED STENOSIS WITH EDEMA IN THE CORD.  S/P c3 to C6 diskectomy, cord decomp., foraminectomies and C3 to C7 fusion.   OT comments  Pt improving with ability to perform BADLs. She requires min cues for cervical precautions.  Pt has been instructed in how to don/doff collar, but family needs instruction as pt requires min A  Follow Up Recommendations  No OT follow up;Supervision - Intermittent    Barriers to Discharge       Equipment Recommendations  None recommended by OT    Recommendations for Other Services    Frequency Min 2X/week   Progress towards OT Goals Progress towards OT goals: Progressing toward goals  Plan Discharge plan remains appropriate    Precautions / Restrictions Precautions Precautions: Cervical Required Braces or Orthoses: Cervical Brace Cervical Brace: Hard collar;At all times Restrictions Weight Bearing Restrictions: No   Pertinent Vitals/Pain     ADL  Grooming: Wash/dry hands;Wash/dry face;Teeth care;Supervision/safety Where Assessed - Grooming: Supported standing Lower Body Bathing: Minimal assistance Where Assessed - Lower Body Bathing: Supported sit to stand Lower Body Dressing: Min guard Where Assessed - Lower Body Dressing: Supported sit to Pharmacist, hospital: Hydrographic surveyor Method: Sit to stand;Stand pivot Acupuncturist: Comfort height toilet Toileting - Architect and Hygiene: Min guard Where Assessed - Engineer, mining and  Hygiene: Standing Tub/Shower Transfer: Insurance risk surveyor Method: Architectural technologist Used: Other (comment) (cervical collar) Transfers/Ambulation Related to ADLs: min guard assist ADL Comments: Pt requires verbal cues for precautions with brushing teeth.  Pt tends to bend at waist.  Reinforced that she can only do this with brace on.  If brace is off, she needs to avoid bending forward to reduce tension on her neck.  Pt. instructed in use of LH bath sponge for LB bathing in standing     OT Diagnosis:    OT Problem List:   OT Treatment Interventions:     OT Goals(current goals can now be found in the care plan section) ADL Goals Pt Will Perform Grooming: with modified independence;standing Pt Will Perform Upper Body Bathing: with supervision;sitting Pt Will Perform Lower Body Bathing: with supervision;sit to/from stand Pt Will Perform Upper Body Dressing: with supervision;sitting Pt Will Perform Lower Body Dressing: with supervision;sit to/from stand Pt Will Transfer to Toilet: with modified independence;regular height toilet;ambulating Pt Will Perform Toileting - Clothing Manipulation and hygiene: with modified independence;sit to/from stand Pt Will Perform Tub/Shower Transfer: Tub transfer;with supervision;rolling walker;ambulating Pt/caregiver will Perform Home Exercise Program: For increased strengthening Additional ADL Goal #1: Pt will be independent with cervical precautions during all ADLs  Visit Information  Last OT Received On: 03/17/13 Assistance Needed: +1 History of Present Illness:  patient who has been complaining of neck pain with radiation to both upper extremities associated with burning sensation. She has had a complety work up which included EMG  AND ncv , BOTJ BEIBG NORMAL. A MRI OF THE CERVICAL SPINE SHOWED STENOSIS WITH EDEMA IN THE CORD.  S/P c3 to C6 diskectomy, cord decomp., foraminectomies  and C3 to C7 fusion.    Subjective Data      Prior  Functioning       Cognition  Cognition Arousal/Alertness: Awake/alert Behavior During Therapy: WFL for tasks assessed/performed Overall Cognitive Status: Within Functional Limits for tasks assessed    Mobility  Bed Mobility Bed Mobility: Rolling Right;Right Sidelying to Sit;Sitting - Scoot to Edge of Bed;Sit to Sidelying Right Rolling Right: 4: Min guard;With rail Right Sidelying to Sit: 4: Min assist;With rails;HOB flat Sitting - Scoot to Edge of Bed: 4: Min guard Sit to Sidelying Right: 4: Min assist Details for Bed Mobility Assistance: verbal cues for proper technique.  Assist for trunk and LEs Transfers Transfers: Sit to Stand;Stand to Sit Sit to Stand: 4: Min guard;With upper extremity assist;From bed;From toilet Stand to Sit: 4: Min guard;With upper extremity assist;To chair/3-in-1;To bed Details for Transfer Assistance: vc's for hand placement;     Exercises      Balance     End of Session OT - End of Session Equipment Utilized During Treatment: Cervical collar Activity Tolerance: Patient tolerated treatment well Patient left: in bed;with call bell/phone within reach  GO     Brittany Robles 03/17/2013, 2:30 PM

## 2013-03-17 NOTE — Progress Notes (Signed)
Physical Therapy Treatment Patient Details Name: Brittany Robles MRN: [id number] DOB: April 08, 1941 Today's Date: 03/17/2013 Time: 2130-8657 PT Time Calculation (min): 23 min  PT Assessment / Plan / Recommendation  History of Present Illness  patient who has been complaining of neck pain with radiation to both upper extremities associated with burning sensation. She has had a complety work up which included EMG  AND ncv , BOTJ BEIBG NORMAL. A MRI OF THE CERVICAL SPINE SHOWED STENOSIS WITH EDEMA IN THE CORD.  S/P c3 to C6 diskectomy, cord decomp., foraminectomies and C3 to C7 fusion.   PT Comments   Emphasis on gait and bed mobility education and practice.  Pt coming along well.  Follow Up Recommendations  Home health PT;Supervision for mobility/OOB     Does the patient have the potential to tolerate intense rehabilitation     Barriers to Discharge        Equipment Recommendations  None recommended by PT    Recommendations for Other Services    Frequency Min 5X/week   Progress towards PT Goals Progress towards PT goals: Progressing toward goals  Plan Current plan remains appropriate    Precautions / Restrictions Precautions Precautions: Cervical Required Braces or Orthoses: Cervical Brace Cervical Brace: Hard collar;At all times Restrictions Weight Bearing Restrictions: No   Pertinent Vitals/Pain     Mobility  Bed Mobility Bed Mobility: Rolling Right;Right Sidelying to Sit;Sitting - Scoot to Edge of Bed;Sit to Sidelying Right Rolling Right: 4: Min guard;With rail Right Sidelying to Sit: 4: Min assist;With rails;HOB flat Sitting - Scoot to Edge of Bed: 4: Min guard Sit to Sidelying Right: 4: Min assist Details for Bed Mobility Assistance: vc's for technique, truncal assist Transfers Transfers: Sit to Stand;Stand to Sit Sit to Stand: 4: Min guard;From elevated surface;From bed Stand to Sit: 4: Min guard;With upper extremity assist;To chair/3-in-1 Details for Transfer  Assistance: vc's for hand placement;  Ambulation/Gait Ambulation/Gait Assistance: 4: Min guard Ambulation Distance (Feet): 250 Feet Assistive device: None Ambulation/Gait Assistance Details: generally steady, no s/s of dizziness today Gait Pattern: Step-through pattern Stairs: No    Exercises     PT Diagnosis:    PT Problem List:   PT Treatment Interventions:     PT Goals (current goals can now be found in the care plan section) Acute Rehab PT Goals PT Goal Formulation: With patient Time For Goal Achievement: 03/23/13 Potential to Achieve Goals: Good  Visit Information  Last PT Received On: 03/17/13 Assistance Needed: +1 History of Present Illness:  patient who has been complaining of neck pain with radiation to both upper extremities associated with burning sensation. She has had a complety work up which included EMG  AND ncv , BOTJ BEIBG NORMAL. A MRI OF THE CERVICAL SPINE SHOWED STENOSIS WITH EDEMA IN THE CORD.  S/P c3 to C6 diskectomy, cord decomp., foraminectomies and C3 to C7 fusion.    Subjective Data  Subjective: I just can't take the brace touching me.   Cognition  Cognition Arousal/Alertness: Awake/alert Behavior During Therapy: WFL for tasks assessed/performed Overall Cognitive Status: Within Functional Limits for tasks assessed    Balance     End of Session PT - End of Session Equipment Utilized During Treatment: Cervical collar Activity Tolerance: Patient tolerated treatment well;Other (comment) Patient left: in bed;with call bell/phone within reach Nurse Communication: Mobility status   GP     Jeromy Borcherding, Eliseo Gum 03/17/2013, 12:41 PM 03/17/2013  [place] Bing, PT [phone]  (pager)

## 2013-03-18 ENCOUNTER — Encounter (HOSPITAL_COMMUNITY): Payer: Self-pay | Admitting: Neurosurgery

## 2013-03-18 NOTE — Plan of Care (Signed)
D/c instructions given to pt and pt daughter. V/u. Med script given to pt. V/u. Pt is stable to be d.c

## 2013-03-18 NOTE — Discharge Summary (Signed)
Physician Discharge Summary  Patient ID: Brittany Robles MRN: [id number] DOB/AGE: [birth year] 72 y.o.  Admit date: 03/15/2013 Discharge date: 03/18/2013  Admission Diagnoses:cervical stenosis  Discharge Diagnoses:same  Active Problems:   * No active hospital problems. *   Discharged Condition:no pain or weakness  Hospital Course: surgery  Consults none  Significant Diagnostic Studies: mri  Treatments: c3 to c7 fusion  Discharge Exam: Blood pressure 164/76, pulse 98, temperature 98.5 F (36.9 C), temperature source Oral, resp. rate 18, height 4\' 11"  (1.499 m), weight 66.089 kg (145 lb 11.2 oz), last menstrual period 08/19/1983, SpO2 95.00%. Ambulating, no weakness  Disposition: home   Future Appointments Provider Department Dept Phone   05/02/2013 11:00 AM Sandford Craze, NP [facility] at  [facility] [phone]       Medication List    ASK your doctor about these medications       acetaminophen 325 MG tablet  Commonly known as:  TYLENOL  Take 650 mg by mouth every 6 (six) hours as needed for pain or fever.     amLODipine 5 MG tablet  Commonly known as:  NORVASC  Take 5 mg by mouth daily.     aspirin EC 81 MG tablet  Take 81 mg by mouth daily.     CALTRATE 600+D 600-400 MG-UNIT per tablet  Generic drug:  Calcium Carbonate-Vitamin D  Take 1 tablet by mouth daily.     losartan 50 MG tablet  Commonly known as:  COZAAR  Take 1 tablet (50 mg total) by mouth daily.     nystatin 100000 UNIT/GM Powd  Apply 1 g topically 2 (two) times daily as needed (for rash).     valACYclovir 500 MG tablet  Commonly known as:  VALTREX  Take 500 mg by mouth 2 (two) times daily. For 3 days, starting at first sign of break out         Signed: Issiac Jamar M 03/18/2013, 12:08 PM

## 2013-03-18 NOTE — Care Management Note (Signed)
    Page 1 of 2   03/18/2013     11:40:18 AM   CARE MANAGEMENT NOTE 03/18/2013  Patient:  Brittany Robles, Brittany Robles   Account Number:  [id number]  Date Initiated:  03/16/2013  Documentation initiated by:  Jiles Crocker  Subjective/Objective Assessment:   ADMITTED FOR SURGERY - CERVICAL SPINE FUSION     Action/Plan:   LIVES WITH FAMILY MEMBERS; CM FOLLOWING FOR DCP; AWAITING ON PT/OT EVALS FOR DISPOSITION   Anticipated DC Date:  03/22/2013   Anticipated DC Plan:  HOME W HOME HEALTH SERVICES      DC Planning Services  CM consult      Choice offered to / List presented to:  C-1 Patient        HH arranged  HH-2 PT      [facility] agency  [facility]   Status of service:  In process, will continue to follow Medicare Important Message given?  NA - LOS <3 / Initial given by admissions (If response is "NO", the following Medicare IM given date fields will be blank) Date Medicare IM given:   Date Additional Medicare IM given:    Discharge Disposition:    Per UR Regulation:  Reviewed for med. necessity/level of care/duration of stay  If discussed at Long Length of Stay Meetings, dates discussed:    Comments:  03/18/13 1030 Elmer Bales RN, MSN, CM- Per patient's RN, patient has decided to go home instead of to her son's after discharge.  CM wil notified [facility].    03/17/13 1200 Elmer Bales RN, MSN, CM- Spoke with Corrie Dandy with [facility] to verify that referral was recieved.  CM notified [facility] to check  notation for son's address information.   03/17/13 1100 [name] RN, MSN, CM- Met with patient to discuss home health needs.  Pt is agreeable to home health and has chosen [facility].  Pt will be going to her son's home after discharge, but cannot remember her address at this time.  Pt to notify CM when she has verified the address with her son.  Voicemail was left for [facility] with [facility] regarding the referral.  Await return call.  03/16/2013- B CHANDLER RN,BSN,MHA

## 2013-03-28 ENCOUNTER — Other Ambulatory Visit: Payer: Self-pay | Admitting: Family

## 2013-03-30 ENCOUNTER — Other Ambulatory Visit: Payer: Self-pay | Admitting: Family

## 2013-04-13 ENCOUNTER — Telehealth: Payer: Self-pay | Admitting: Family

## 2013-04-13 NOTE — Telephone Encounter (Signed)
Please call pt and let her know that she is due for follow up carotid doppler.  I have pended.

## 2013-04-13 NOTE — Telephone Encounter (Signed)
Notified pt and she states she just completed neck surgery with Dr Jeral Fruit on 03/15/13. Pt states she doesn't feel she needs this test. States she doesn't want to do anything because she doesn't want any further surgeries on her neck. I advised pt this was to check the artery in her neck and was not related to her cervical spine. Pt wants clarification on why she needs this.  Please advise.

## 2013-04-13 NOTE — Telephone Encounter (Signed)
Spoke to pt. She is in cervical collar from surgery.  Will defer ultrasound for now.   Brittany Robles- pt needs to reschedule her follow up visit. Could you please call her tomorrow to reschedule?

## 2013-04-14 NOTE — Telephone Encounter (Signed)
Patient cancelled 05/02/13 appointment and states that she will have to call back to reschedule

## 2013-05-02 ENCOUNTER — Ambulatory Visit: Payer: Medicare Other | Admitting: Family

## 2013-05-06 ENCOUNTER — Ambulatory Visit (INDEPENDENT_AMBULATORY_CARE_PROVIDER_SITE_OTHER): Payer: Medicare Other | Admitting: Family

## 2013-05-06 ENCOUNTER — Encounter: Payer: Self-pay | Admitting: Family

## 2013-05-06 VITALS — BP 100/60 | HR 69 | Temp 98.5°F | Resp 16 | Ht 59.0 in | Wt 130.1 lb

## 2013-05-06 DIAGNOSIS — Z23 Encounter for immunization: Secondary | ICD-10-CM

## 2013-05-06 DIAGNOSIS — E119 Type 2 diabetes mellitus without complications: Secondary | ICD-10-CM

## 2013-05-06 DIAGNOSIS — I6529 Occlusion and stenosis of unspecified carotid artery: Secondary | ICD-10-CM

## 2013-05-06 DIAGNOSIS — R209 Unspecified disturbances of skin sensation: Secondary | ICD-10-CM

## 2013-05-06 DIAGNOSIS — I10 Essential (primary) hypertension: Secondary | ICD-10-CM

## 2013-05-06 DIAGNOSIS — E785 Hyperlipidemia, unspecified: Secondary | ICD-10-CM

## 2013-05-06 DIAGNOSIS — R200 Anesthesia of skin: Secondary | ICD-10-CM

## 2013-05-06 DIAGNOSIS — I6523 Occlusion and stenosis of bilateral carotid arteries: Secondary | ICD-10-CM

## 2013-05-06 DIAGNOSIS — I658 Occlusion and stenosis of other precerebral arteries: Secondary | ICD-10-CM

## 2013-05-06 NOTE — Progress Notes (Signed)
Subjective:    Patient ID: Brittany Robles, female    DOB: 03-Dec-1940, 72 y.o.   MRN: [id number]  HPI  Brittany Robles is a 72 yr old female who presents today for follow up.  1) DM2-Last A1C was obtain on 6/18 and was at goal at 6.3.  She reports that she is watching her diet.  Denies excessive thirst or urination.  Last eye exam- 1 yr ago. She will schedule follow up.   2) HTN- She is currently maintained on amlodipine and losartan.  3) Tingling-on 7/29 the patient underwent cervical 3-4, 4-5, 5-6, and C6-7 diskectomy, decompression  of the spinal cord, bilateral foraminotomy, interbody fusion with cage plate from W0-J8. She reports that the pain is improved. Less numbness in the upper arms.  Still has tingling in her hands.    Review of Systems    see HPI  Past Medical History  Diagnosis Date  . Carpal tunnel syndrome, bilateral   . Hypertension   . Spinal stenosis of lumbar region   . Diabetes mellitus     Type 2  . Bowel obstruction   . 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 POLYCYSTIC KIDNEY DISEASE 10/11/2009  . Carotid artery stenosis 03/08/2012    Bilateral- 40-50% per duplex 7/13.  Needs follow up duplex in 1 year.   Marland Kitchen PONV (postoperative nausea and vomiting)     History   Social History  . Marital Status: Divorced    Spouse Name: N/A    Number of Children: 3  . Years of Education: N/A   Occupational History  . Retired    Social History Main Topics  . Smoking status: Former Smoker    Quit date: 04/18/2010  . Smokeless tobacco: Not on file  . Alcohol Use: Yes  . Drug Use: No  . Sexual Activity: Not on file   Other Topics Concern  . 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 Small intestine surgery      for bowel obstruction  . Appendectomy    . Spine surgery      lumbar spinal stenosis  . Cesarean  section      x 2  . Carpal tunnel release    . Breast surgery      bx,   neg  . Anterior cervical decompression/discectomy fusion 4 levels N/A 03/15/2013    Procedure: Cervical Three-Four Cervical Four-Five Cervical Five-Six Cervical Six-Seven Anterior cervical decompression/diskectomy/fusion;  Surgeon: Karn Cassis, MD;  Location: [facility];  Service: Neurosurgery;  Laterality: N/A;  Cervical Three-Four Cervical Four-Five Cervical Five-Six Cervical Six-Seven Anterior cervical decompression/diskectomy/fusion    Family History  Problem Relation Age of Onset  . Cancer Other     Lung    Allergies  Allergen Reactions  . Ibuprofen Other (See Comments)    Strange feelings and dreams  . Lisinopril Other (See Comments)    Light headed  . Medrol [Methylprednisolone]     Near syncope, low BP    Current Outpatient Prescriptions on File Prior to Visit  Medication Sig Dispense Refill  . acetaminophen (TYLENOL) 325 MG tablet Take 650 mg by mouth every 6 (six) hours as needed for pain or fever.       Marland Kitchen amLODipine (NORVASC) 5 MG tablet Take 5 mg by mouth daily.      Marland Kitchen  amLODipine (NORVASC) 5 MG tablet Take 1 tablet (5 mg total) by mouth daily.  30 tablet  6  . aspirin EC 81 MG tablet Take 81 mg by mouth daily.      . Calcium Carbonate-Vitamin D (CALTRATE 600+D) 600-400 MG-UNIT per tablet Take 1 tablet by mouth daily.      Marland Kitchen losartan (COZAAR) 50 MG tablet TAKE ONE TABLET BY MOUTH EVERY DAY  90 tablet  1  . nystatin (MYCOSTATIN/NYSTOP) 100000 UNIT/GM POWD Apply 1 g topically 2 (two) times daily as needed (for rash).      . valACYclovir (VALTREX) 500 MG tablet Take 500 mg by mouth 2 (two) times daily. For 3 days, starting at first sign of break out       No current facility-administered medications on file prior to visit.    BP 100/60  Pulse 69  Temp(Src) 98.5 F (36.9 C) (Oral)  Resp 16  Ht 4\' 11"  (1.499 m)  Wt 130 lb 1.3 oz (59.004 kg)  BMI 26.26 kg/m2  SpO2 99%  LMP  08/19/1983    Objective:   Physical Exam  Constitutional: She is oriented to person, place, and time. She appears well-developed and well-nourished. No distress.  Cardiovascular: Normal rate and regular rhythm.   No murmur heard. Pulmonary/Chest: Effort normal and breath sounds normal. No respiratory distress. She has no wheezes. She has no rales. She exhibits no tenderness.  Musculoskeletal: She exhibits no edema.  Neurological: She is alert and oriented to person, place, and time.  Strong equal hand grasps, bilateral UE strength is 5/5  Psychiatric: She has a normal mood and affect. Her behavior is normal. Judgment and thought content normal.          Assessment & Plan:   BP Readings from Last 3 Encounters:  05/06/13 100/60  03/18/13 106/61  03/18/13 106/61

## 2013-05-06 NOTE — Assessment & Plan Note (Signed)
Appears mildly over treated. Advised pt to cut amlodipine in half and check BP daily x 1 week.  Call in 1 week with blood pressures.

## 2013-05-06 NOTE — Assessment & Plan Note (Signed)
Clinically stable.  Obtain A1C.  

## 2013-05-06 NOTE — Assessment & Plan Note (Signed)
She is due for carotid duplex and is agreeable to proceed.

## 2013-05-06 NOTE — Patient Instructions (Addendum)
Please return fasting for blood work on Monday. Cut amlodipine in half and take 1/2 tablet by mouth once daily. Check blood pressure daily x 1 week, then call me with your readings. Call us to schedule your shingle shot when you are ready. Schedule your diabetic eye exam at your earliest convenience. Follow up in 3 months.

## 2013-05-06 NOTE — Assessment & Plan Note (Signed)
Unchanged.  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 Advised pt to address this with Dr. Jeral Fruit.

## 2013-05-10 ENCOUNTER — Other Ambulatory Visit: Payer: Self-pay | Admitting: Family

## 2013-05-10 DIAGNOSIS — E785 Hyperlipidemia, unspecified: Secondary | ICD-10-CM

## 2013-05-10 LAB — HEPATIC FUNCTION PANEL
ALT: 9 U/L (ref 0–35)
AST: 12 U/L (ref 0–37)
Alkaline Phosphatase: 53 U/L (ref 39–117)
Bilirubin, Direct: 0.1 mg/dL (ref 0.0–0.3)
Total Bilirubin: 0.6 mg/dL (ref 0.3–1.2)

## 2013-05-10 LAB — BASIC METABOLIC PANEL WITH GFR
CO2: 27 mEq/L (ref 19–32)
Calcium: 9.7 mg/dL (ref 8.4–10.5)
GFR, Est African American: 44 mL/min — ABNORMAL LOW
Glucose, Bld: 128 mg/dL — ABNORMAL HIGH (ref 70–99)
Potassium: 4.8 mEq/L (ref 3.5–5.3)
Sodium: 139 mEq/L (ref 135–145)

## 2013-05-10 LAB — LIPID PANEL
Cholesterol: 180 mg/dL (ref 0–200)
Total CHOL/HDL Ratio: 3.2 Ratio

## 2013-05-10 LAB — HEMOGLOBIN A1C
Hgb A1c MFr Bld: 6.6 % — ABNORMAL HIGH (ref <5.7)
Mean Plasma Glucose: 143 mg/dL — ABNORMAL HIGH (ref <117)

## 2013-05-10 NOTE — Telephone Encounter (Signed)
Kidney function is stable.  Sugar control has slightly worsened.  Cholesterol is slightly above goal.  I would recommend a low dose cholesterol medication to low risk of heart attack and stroke.  Recommend simvastatin 10mg  daily, repeat flp/lf tin 6 weeks, call if unusual muscle pain while on this medication.

## 2013-05-11 NOTE — Telephone Encounter (Signed)
Left message for pt to return my call.

## 2013-05-12 ENCOUNTER — Other Ambulatory Visit: Payer: Self-pay | Admitting: Family

## 2013-05-12 ENCOUNTER — Telehealth: Payer: Self-pay | Admitting: Family

## 2013-05-12 DIAGNOSIS — I6523 Occlusion and stenosis of bilateral carotid arteries: Secondary | ICD-10-CM

## 2013-05-12 MED ORDER — SIMVASTATIN 10 MG PO TABS: 10.0000 mg | ORAL_TABLET | Freq: Every day | ORAL | Status: DC

## 2013-05-12 NOTE — Telephone Encounter (Signed)
Pt wants to complete Carotid ultrasound in the imaging dept. Downstairs and scheduled appt for tomorrow @ 9:30am. Can you re-enter the order or do you want pt to complete test at [facility] as ordered?

## 2013-05-12 NOTE — Telephone Encounter (Signed)
Per verbal from Provider, ok to order test at [facility]. Order cancelled at [facility] and re-entered for [facility]. Notified pt.

## 2013-05-12 NOTE — Telephone Encounter (Signed)
Notified pt and she voices understanding. Lab order entered. Pt brought BP readings into the office as below:  Saturday  107/79 Sunday    135/102 Monday  145/92 Tuesday  102/75 Wednesday  166/106 Thursday  127/87  Pt currently taking 1/2 tablet amlodipine daily and 1 tablet losartan daily.  Please advise.

## 2013-05-12 NOTE — Telephone Encounter (Signed)
Please continue current medications.

## 2013-05-13 ENCOUNTER — Ambulatory Visit (HOSPITAL_BASED_OUTPATIENT_CLINIC_OR_DEPARTMENT_OTHER): Payer: Medicare Other

## 2013-05-13 NOTE — Telephone Encounter (Signed)
Notified pt and she voices understanding. 

## 2013-05-14 ENCOUNTER — Other Ambulatory Visit (HOSPITAL_BASED_OUTPATIENT_CLINIC_OR_DEPARTMENT_OTHER): Payer: Medicare Other

## 2013-05-14 ENCOUNTER — Other Ambulatory Visit: Payer: Self-pay | Admitting: Family

## 2013-05-16 ENCOUNTER — Ambulatory Visit (HOSPITAL_BASED_OUTPATIENT_CLINIC_OR_DEPARTMENT_OTHER)
Admission: RE | Admit: 2013-05-16 | Discharge: 2013-05-16 | Disposition: A | Discharge status: Home / Self Care | Payer: Medicare Other | Source: Ambulatory Visit | Attending: Family | Admitting: Family

## 2013-05-16 DIAGNOSIS — E119 Type 2 diabetes mellitus without complications: Secondary | ICD-10-CM | POA: Insufficient documentation

## 2013-05-16 DIAGNOSIS — E7800 Pure hypercholesterolemia, unspecified: Secondary | ICD-10-CM | POA: Insufficient documentation

## 2013-05-16 DIAGNOSIS — I6529 Occlusion and stenosis of unspecified carotid artery: Secondary | ICD-10-CM | POA: Insufficient documentation

## 2013-05-16 DIAGNOSIS — Z87891 Personal history of nicotine dependence: Secondary | ICD-10-CM | POA: Insufficient documentation

## 2013-05-16 DIAGNOSIS — I10 Essential (primary) hypertension: Secondary | ICD-10-CM | POA: Insufficient documentation

## 2013-05-16 DIAGNOSIS — I658 Occlusion and stenosis of other precerebral arteries: Secondary | ICD-10-CM | POA: Insufficient documentation

## 2013-05-16 DIAGNOSIS — R209 Unspecified disturbances of skin sensation: Secondary | ICD-10-CM | POA: Insufficient documentation

## 2013-05-16 NOTE — Telephone Encounter (Signed)
Rx request to pharmacy/[facility]  

## 2013-05-17 ENCOUNTER — Telehealth: Payer: Self-pay

## 2013-05-17 NOTE — Telephone Encounter (Signed)
Message copied by Darnell Level on Tue May 17, 2013  2:01 PM ------      Message from: O'SULLIVAN, MELISSA      Created: Mon May 16, 2013  5:02 PM       Please let pt know that her carotid duplex is stable compared to last year.  We should continue to check this every 1-2 years. ------

## 2013-05-17 NOTE — Telephone Encounter (Signed)
Patient notified of carotid duplex results, she still has questions of why she is having the tingling in her hands. She thought that this test would provide further clarification on that... Please advise.

## 2013-05-18 NOTE — Telephone Encounter (Signed)
Spoke with pt. Reviewed with her that tingling is likely due to spinal issues and advised her to address with Dr. Jeral Fruit.  She verbalizes understanding and tells me that Dr. Jeral Fruit is aware and will re-evaluate her in 6 weeks.

## 2013-07-04 ENCOUNTER — Telehealth: Payer: Self-pay | Admitting: Family

## 2013-07-04 DIAGNOSIS — Z1211 Encounter for screening for malignant neoplasm of colon: Secondary | ICD-10-CM

## 2013-07-04 NOTE — Telephone Encounter (Signed)
INSURANCE COMPANY CALLED AND SAID PATIENT IS DUE FOR FECAL SCREENING.

## 2013-07-06 NOTE — Telephone Encounter (Signed)
Patient states that she will pickup IFOB kit Monday of next week.

## 2013-07-06 NOTE — Telephone Encounter (Signed)
No answer at home #, no machine. Left message to return my call on cell #.

## 2013-07-06 NOTE — Telephone Encounter (Signed)
Lab order entered and kit placed at front desk for pick up.

## 2013-07-06 NOTE — Telephone Encounter (Signed)
Please let pt know. She can pick up IFOB kit at her convenience. Dx is screening  colon cancer.

## 2013-07-21 ENCOUNTER — Encounter: Payer: Self-pay | Admitting: Family

## 2013-07-26 ENCOUNTER — Telehealth: Payer: Self-pay | Admitting: Family

## 2013-07-26 MED ORDER — VALACYCLOVIR HCL 500 MG PO TABS: ORAL_TABLET | ORAL | Status: DC

## 2013-07-26 NOTE — Telephone Encounter (Signed)
Refill request faxed over, Valtrex 500mg  1 tab by mouth twice daily for 3 days start at first sign of breakout

## 2013-07-26 NOTE — Telephone Encounter (Signed)
Refill sent.

## 2013-08-01 ENCOUNTER — Encounter: Payer: Self-pay | Admitting: Family

## 2013-08-01 ENCOUNTER — Ambulatory Visit (INDEPENDENT_AMBULATORY_CARE_PROVIDER_SITE_OTHER): Payer: Medicare Other | Admitting: Family

## 2013-08-01 VITALS — BP 130/74 | HR 55 | Temp 98.3°F | Resp 16 | Ht 59.0 in | Wt 130.0 lb

## 2013-08-01 DIAGNOSIS — E119 Type 2 diabetes mellitus without complications: Secondary | ICD-10-CM

## 2013-08-01 DIAGNOSIS — I6529 Occlusion and stenosis of unspecified carotid artery: Secondary | ICD-10-CM

## 2013-08-01 DIAGNOSIS — E785 Hyperlipidemia, unspecified: Secondary | ICD-10-CM

## 2013-08-01 DIAGNOSIS — Z23 Encounter for immunization: Secondary | ICD-10-CM

## 2013-08-01 DIAGNOSIS — I6523 Occlusion and stenosis of bilateral carotid arteries: Secondary | ICD-10-CM

## 2013-08-01 DIAGNOSIS — I10 Essential (primary) hypertension: Secondary | ICD-10-CM

## 2013-08-01 DIAGNOSIS — I658 Occlusion and stenosis of other precerebral arteries: Secondary | ICD-10-CM

## 2013-08-01 DIAGNOSIS — M5090 Cervical disc disorder, unspecified, unspecified cervical region: Secondary | ICD-10-CM

## 2013-08-01 MED ORDER — VALACYCLOVIR HCL 1 G PO TABS: 1000.0000 mg | ORAL_TABLET | Freq: Every day | ORAL | Status: DC

## 2013-08-01 NOTE — Progress Notes (Signed)
Subjective:    Patient ID: Brittany Robles, female    DOB: Aug 24, 1940, 72 y.o.   MRN: [id number]  HPI  Brittany Robles is a 72 yr old female who presents today for follow up.  1) DM2- too early for A1C.  Reports normal eye exam 3 weeks ago.  Lab Results  Component Value Date   HGBA1C 6.6* 05/09/2013    2) HTN- continues amlodipine and losartan. Denies CP or SOB.   3) Cervical disc disease- has apt with pain management doctor next week, referred by Dr. Jeral Fruit due to ongoing numbness in hands.   4) Carotid stenosis- bilateral stenosis <50%.    5) Hyperlipidemia- stopped statin, declines blood work today.   Review of Systems See HPI  Past Medical History  Diagnosis Date  . Carpal tunnel syndrome, bilateral   . Hypertension   . Spinal stenosis of lumbar region   . Diabetes mellitus     Type 2  . Bowel obstruction   . 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 POLYCYSTIC KIDNEY DISEASE 10/11/2009  . Carotid artery stenosis 03/08/2012    Bilateral- 40-50% per duplex 7/13.  Needs follow up duplex in 1 year.   Marland Kitchen PONV (postoperative nausea and vomiting)     History   Social History  . Marital Status: Divorced    Spouse Name: N/A    Number of Children: 3  . Years of Education: N/A   Occupational History  . Retired    Social History Main Topics  . Smoking status: Former Smoker    Quit date: 04/18/2010  . Smokeless tobacco: Not on file  . Alcohol Use: Yes  . Drug Use: No  . Sexual Activity: Not on file   Other Topics Concern  . 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 Small intestine surgery      for bowel obstruction  . Appendectomy    . Spine surgery      lumbar spinal stenosis  . Cesarean section      x 2  . Carpal tunnel release    . Breast surgery      bx,   neg  . Anterior cervical  decompression/discectomy fusion 4 levels N/A 03/15/2013    Procedure: Cervical Three-Four Cervical Four-Five Cervical Five-Six Cervical Six-Seven Anterior cervical decompression/diskectomy/fusion;  Surgeon: Karn Cassis, MD;  Location: [facility];  Service: Neurosurgery;  Laterality: N/A;  Cervical Three-Four Cervical Four-Five Cervical Five-Six Cervical Six-Seven Anterior cervical decompression/diskectomy/fusion    Family History  Problem Relation Age of Onset  . Cancer Other     Lung    Allergies  Allergen Reactions  . Ibuprofen Other (See Comments)    Strange feelings and dreams  . Lisinopril Other (See Comments)    Light headed  . Medrol [Methylprednisolone]     Near syncope, low BP    Current Outpatient Prescriptions on File Prior to Visit  Medication Sig Dispense Refill  . acetaminophen (TYLENOL) 325 MG tablet Take 650 mg by mouth every 6 (six) hours as needed for pain or fever.       Marland Kitchen amLODipine (NORVASC) 5 MG tablet Take 2.5 mg by mouth daily.       Marland Kitchen aspirin EC 81 MG tablet Take 81 mg by mouth daily.      Marland Kitchen  Calcium Carbonate-Vitamin D (CALTRATE 600+D) 600-400 MG-UNIT per tablet Take 1 tablet by mouth daily.      Marland Kitchen losartan (COZAAR) 50 MG tablet TAKE ONE TABLET BY MOUTH EVERY DAY  90 tablet  1  . nystatin (MYCOSTATIN/NYSTOP) 100000 UNIT/GM POWD Apply 1 g topically 2 (two) times daily as needed (for rash).      . simvastatin (ZOCOR) 10 MG tablet Take 1 tablet (10 mg total) by mouth at bedtime.  30 tablet  2   No current facility-administered medications on file prior to visit.    BP 130/74  Pulse 55  Temp(Src) 98.3 F (36.8 C) (Oral)  Resp 16  Ht 4\' 11"  (1.499 m)  Wt 130 lb 0.6 oz (58.986 kg)  BMI 26.25 kg/m2  SpO2 99%  LMP 08/19/1983       Objective:   Physical Exam  Constitutional: She is oriented to person, place, and time. She appears well-developed and well-nourished. No distress.  HENT:  Head: Normocephalic and atraumatic.  Cardiovascular: Normal  rate and regular rhythm.   No murmur heard. Pulmonary/Chest: Effort normal and breath sounds normal. No respiratory distress. She has no wheezes. She has no rales. She exhibits no tenderness.  Musculoskeletal: She exhibits no edema.  Neurological: She is alert and oriented to person, place, and time.  Skin: Skin is warm and dry.  Psychiatric: She has a normal mood and affect. Her behavior is normal. Judgment and thought content normal.          Assessment & Plan:

## 2013-08-01 NOTE — Progress Notes (Signed)
Pre visit review using our clinic review tool, if applicable. No additional management support is needed unless otherwise documented below in the visit note. 

## 2013-08-01 NOTE — Assessment & Plan Note (Signed)
BP Readings from Last 3 Encounters:  08/01/13 130/74  05/06/13 100/60  03/18/13 106/61   BP stable on current meds.  continue same.

## 2013-08-01 NOTE — Assessment & Plan Note (Signed)
Plans to resume statin, wants to check labs next visit.

## 2013-08-01 NOTE — Assessment & Plan Note (Signed)
Will need follow up duplex in November 2014.

## 2013-08-01 NOTE — Assessment & Plan Note (Signed)
She will return for A1C (too early to draw) clinically stable.

## 2013-08-01 NOTE — Assessment & Plan Note (Signed)
Will be establishing with pain management.

## 2013-08-01 NOTE — Patient Instructions (Signed)
Please follow up in 3 months.  

## 2013-09-11 ENCOUNTER — Other Ambulatory Visit: Payer: Self-pay | Admitting: Family

## 2013-09-13 ENCOUNTER — Telehealth: Payer: Self-pay | Admitting: Family

## 2013-09-13 NOTE — Telephone Encounter (Signed)
Patient is requesting a 90 day supply of [facility] [address]

## 2013-09-13 NOTE — Telephone Encounter (Signed)
Notified pt that refill was already sent to [facility] [address] yesterday. Pt will contact pharmacy and ask them to [place] [facility].

## 2013-10-21 ENCOUNTER — Other Ambulatory Visit: Payer: Self-pay | Admitting: Family

## 2013-10-21 DIAGNOSIS — Z1231 Encounter for screening mammogram for malignant neoplasm of breast: Secondary | ICD-10-CM

## 2013-10-24 ENCOUNTER — Ambulatory Visit (HOSPITAL_BASED_OUTPATIENT_CLINIC_OR_DEPARTMENT_OTHER)
Admission: RE | Admit: 2013-10-24 | Discharge: 2013-10-24 | Disposition: A | Discharge status: Home / Self Care | Payer: Medicare Other | Source: Ambulatory Visit | Attending: Family | Admitting: Family

## 2013-10-24 DIAGNOSIS — Z1231 Encounter for screening mammogram for malignant neoplasm of breast: Secondary | ICD-10-CM | POA: Insufficient documentation

## 2013-10-25 ENCOUNTER — Telehealth: Payer: Self-pay | Admitting: Family

## 2013-10-25 LAB — BASIC METABOLIC PANEL
BUN: 27 mg/dL — ABNORMAL HIGH (ref 6–23)
CO2: 28 mEq/L (ref 19–32)
Calcium: 9 mg/dL (ref 8.4–10.5)
Chloride: 102 mEq/L (ref 96–112)
Creat: 1.25 mg/dL — ABNORMAL HIGH (ref 0.50–1.10)
Glucose, Bld: 112 mg/dL — ABNORMAL HIGH (ref 70–99)
Potassium: 5.4 mEq/L — ABNORMAL HIGH (ref 3.5–5.3)
Sodium: 139 mEq/L (ref 135–145)

## 2013-10-25 LAB — HEMOGLOBIN A1C
Hgb A1c MFr Bld: 6.4 % — ABNORMAL HIGH (ref <5.7)
Mean Plasma Glucose: 137 mg/dL — ABNORMAL HIGH (ref <117)

## 2013-10-25 MED ORDER — AMLODIPINE BESYLATE 10 MG PO TABS: 10.0000 mg | ORAL_TABLET | Freq: Every day | ORAL | Status: DC

## 2013-10-25 NOTE — Telephone Encounter (Signed)
Left message with pt's niece to have pt return my call. 

## 2013-10-25 NOTE — Telephone Encounter (Signed)
Kidney function is stable. Potassium is elevated. Stop losartan, increase amlodipine to 10mg . Follow up in 1 week.

## 2013-10-26 NOTE — Telephone Encounter (Addendum)
Notified pt and she voices understanding. She thinks she is taking another BP medication at home but can't recall the name of it. I advised pt that we only had losartan listed on her med list and to call us back with the name of the other medication once she gets home. Scheduled follow up for 11/02/13 at 11am.

## 2013-10-31 NOTE — Telephone Encounter (Signed)
Pt left message today that she went to pick up the amlodipine rx and was told that she can't get it again until the 21st. Called pharmacy. She states pt picked up 10mg  amlodipine on 10/25/13. Spoke with pt, she states she is still taking the 5mg  rx. Pt was able to confirm that she has 10mg  rx on hand as well and will start it when she completed her current supply. She will take 2 of the 5mg  tablets until they are complete then start 1 of the 10mg  tablets daily.

## 2013-11-02 ENCOUNTER — Ambulatory Visit: Payer: Medicare Other | Admitting: Family

## 2013-11-04 ENCOUNTER — Encounter: Payer: Self-pay | Admitting: Family

## 2013-11-04 ENCOUNTER — Ambulatory Visit (INDEPENDENT_AMBULATORY_CARE_PROVIDER_SITE_OTHER): Payer: Medicare Other | Admitting: Family

## 2013-11-04 VITALS — BP 140/80 | HR 85 | Temp 98.0°F | Resp 16 | Ht 59.0 in | Wt 134.1 lb

## 2013-11-04 DIAGNOSIS — I10 Essential (primary) hypertension: Secondary | ICD-10-CM

## 2013-11-04 DIAGNOSIS — E785 Hyperlipidemia, unspecified: Secondary | ICD-10-CM

## 2013-11-04 DIAGNOSIS — E875 Hyperkalemia: Secondary | ICD-10-CM

## 2013-11-04 DIAGNOSIS — M5090 Cervical disc disorder, unspecified, unspecified cervical region: Secondary | ICD-10-CM

## 2013-11-04 LAB — LIPID PANEL
CHOL/HDL RATIO: 2.7 ratio
Cholesterol: 183 mg/dL (ref 0–200)
HDL: 68 mg/dL (ref >39)
LDL Cholesterol: 94 mg/dL (ref 0–99)
Triglycerides: 106 mg/dL (ref <150)
VLDL: 21 mg/dL (ref 0–40)

## 2013-11-04 LAB — BASIC METABOLIC PANEL WITH GFR
BUN: 22 mg/dL (ref 6–23)
CO2: 30 mEq/L (ref 19–32)
Calcium: 9.6 mg/dL (ref 8.4–10.5)
Chloride: 102 mEq/L (ref 96–112)
Creat: 1.32 mg/dL — ABNORMAL HIGH (ref 0.50–1.10)
GFR, Est African American: 46 mL/min — ABNORMAL LOW
GFR, Est Non African American: 40 mL/min — ABNORMAL LOW
Glucose, Bld: 114 mg/dL — ABNORMAL HIGH (ref 70–99)
POTASSIUM: 5.7 meq/L — AB (ref 3.5–5.3)
SODIUM: 138 meq/L (ref 135–145)

## 2013-11-04 LAB — HEPATIC FUNCTION PANEL
ALT: 9 U/L (ref 0–35)
AST: 14 U/L (ref 0–37)
Albumin: 4 g/dL (ref 3.5–5.2)
Alkaline Phosphatase: 51 U/L (ref 39–117)
Bilirubin, Direct: 0.1 mg/dL (ref 0.0–0.3)
Indirect Bilirubin: 0.4 mg/dL (ref 0.2–1.2)
TOTAL PROTEIN: 7.3 g/dL (ref 6.0–8.3)
Total Bilirubin: 0.5 mg/dL (ref 0.2–1.2)

## 2013-11-04 NOTE — Progress Notes (Signed)
Subjective:    Patient ID: Brittany Robles, female    DOB: 1940/11/10, 73 y.o.   MRN: [id number]  HPI  Brittany Robles is a 73 yr old female who presents today for follow up.  1) DM2- checks sugars occasionally.    2) HTN- recently was noted to be hyperkalemic.  ARB was discontinued and instead, pt was placed on amlodipine 10.  BP Readings from Last 3 Encounters:  11/04/13 140/80  08/01/13 130/74  05/06/13 100/60   3) Cervical disc disease- reports arm numbness is better, hand numbness persists but is improved.    4) Hyperlipidemia- last LDL was 112 back in December. Resumed simvastatin but does not take every night.   Review of Systems See HPI  Past Medical History  Diagnosis Date  . Carpal tunnel syndrome, bilateral   . Hypertension   . Spinal stenosis of lumbar region   . Diabetes mellitus     Type 2  . Bowel obstruction   . 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 POLYCYSTIC KIDNEY DISEASE 10/11/2009  . Carotid artery stenosis 03/08/2012    Bilateral- 40-50% per duplex 7/13.  Needs follow up duplex in 1 year.   Marland Kitchen. PONV (postoperative nausea and vomiting)     History   Social History  . Marital Status: Divorced    Spouse Name: N/A    Number of Children: 3  . Years of Education: N/A   Occupational History  . Retired    Social History Main Topics  . Smoking status: Former Smoker    Quit date: 04/18/2010  . Smokeless tobacco: Not on file  . Alcohol Use: Yes  . Drug Use: No  . Sexual Activity: Not on file   Other Topics Concern  . 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 Small intestine surgery      for bowel obstruction  . Appendectomy    . Spine surgery      lumbar spinal stenosis  . Cesarean section      x 2  . Carpal tunnel release    . Breast surgery      bx,   neg  . Anterior cervical  decompression/discectomy fusion 4 levels N/A 03/15/2013    Procedure: Cervical Three-Four Cervical Four-Five Cervical Five-Six Cervical Six-Seven Anterior cervical decompression/diskectomy/fusion;  Surgeon: Karn CassisErnesto M Botero, MD;  Location: [facility];  Service: Neurosurgery;  Laterality: N/A;  Cervical Three-Four Cervical Four-Five Cervical Five-Six Cervical Six-Seven Anterior cervical decompression/diskectomy/fusion    Family History  Problem Relation Age of Onset  . Cancer Other     Lung    Allergies  Allergen Reactions  . Cozaar [Losartan Potassium]     hyperkalemia  . Ibuprofen Other (See Comments)    Strange feelings and dreams  . Lisinopril Other (See Comments)    Light headed  . Medrol [Methylprednisolone]     Near syncope, low BP    Current Outpatient Prescriptions on File Prior to Visit  Medication Sig Dispense Refill  . acetaminophen (TYLENOL) 325 MG tablet Take 650 mg by mouth every 6 (six) hours as needed for pain or fever.       Marland Kitchen. amLODipine (NORVASC) 10 MG tablet Take 1 tablet (10 mg total) by mouth daily.  30 tablet  2  . aspirin EC 81  MG tablet Take 81 mg by mouth daily.      . Calcium Carbonate-Vitamin D (CALTRATE 600+D) 600-400 MG-UNIT per tablet Take 1 tablet by mouth daily.      Marland Kitchen nystatin (MYCOSTATIN/NYSTOP) 100000 UNIT/GM POWD Apply 1 g topically 2 (two) times daily as needed (for rash).      . valACYclovir (VALTREX) 1000 MG tablet Take 1 tablet (1,000 mg total) by mouth daily.  30 tablet  5  . simvastatin (ZOCOR) 10 MG tablet Take 1 tablet (10 mg total) by mouth at bedtime.  30 tablet  2   No current facility-administered medications on file prior to visit.    BP 140/80  Pulse 85  Temp(Src) 98 F (36.7 C) (Oral)  Resp 16  Ht 4\' 11"  (1.499 m)  Wt 134 lb 1.3 oz (60.818 kg)  BMI 27.07 kg/m2  SpO2 98%  LMP 08/19/1983       Objective:   Physical Exam  Constitutional: She is oriented to person, place, and time. She appears well-developed and  well-nourished. No distress.  HENT:  Head: Normocephalic and atraumatic.  Cardiovascular: Normal rate and regular rhythm.   No murmur heard. Pulmonary/Chest: Effort normal and breath sounds normal. No respiratory distress. She has no wheezes. She has no rales. She exhibits no tenderness.  Musculoskeletal: She exhibits no edema.  Bilateral LE strength is 5/5  Neurological: She is alert and oriented to person, place, and time.  Skin: Skin is warm.  Psychiatric: She has a normal mood and affect. Her behavior is normal. Judgment and thought content normal.          Assessment & Plan:

## 2013-11-04 NOTE — Assessment & Plan Note (Signed)
BP stable on amlodipine.  Continue same, obtain follow up bmet today to recheck potassium. Consider adding small dose of ACE next visit for renal protection if potassium allows.

## 2013-11-04 NOTE — Assessment & Plan Note (Signed)
UE numbness and weakness are improving. She is followed by Dr. Jeral FruitBotero.

## 2013-11-04 NOTE — Assessment & Plan Note (Signed)
Check follow up lipid panel/bmet. Continue statin.

## 2013-11-04 NOTE — Patient Instructions (Signed)
Please complete your lab work prior to leaving. Please schedule a follow up appointment in 3 months.  

## 2013-11-04 NOTE — Progress Notes (Signed)
Pre visit review using our clinic review tool, if applicable. No additional management support is needed unless otherwise documented below in the visit note. 

## 2013-11-07 ENCOUNTER — Telehealth: Payer: Self-pay | Admitting: Family

## 2013-11-07 DIAGNOSIS — E875 Hyperkalemia: Secondary | ICD-10-CM

## 2013-11-07 MED ORDER — SODIUM POLYSTYRENE SULFONATE 15 GM/60ML PO SUSP: ORAL | Status: DC

## 2013-11-07 NOTE — Telephone Encounter (Signed)
Relevant patient education mailed to patient.  

## 2013-11-07 NOTE — Telephone Encounter (Signed)
Please call pt and let her know that her potassium remains elevated.  I would like her to take a dose of kayexalate and repeat bmet in 1 week, dx hyperkalemia.  I will also arrange follow up with nephrology.

## 2013-11-07 NOTE — Telephone Encounter (Signed)
Also, cholesterol and liver function look good.

## 2013-11-07 NOTE — Telephone Encounter (Signed)
Notified pt and she voices understanding. She declines referral to nephrologist at this time. Lab order entered.  Please advise.

## 2013-11-18 LAB — BASIC METABOLIC PANEL
BUN: 31 mg/dL — ABNORMAL HIGH (ref 6–23)
CHLORIDE: 102 meq/L (ref 96–112)
CO2: 27 mEq/L (ref 19–32)
Calcium: 9.3 mg/dL (ref 8.4–10.5)
Creat: 1.09 mg/dL (ref 0.50–1.10)
Glucose, Bld: 124 mg/dL — ABNORMAL HIGH (ref 70–99)
POTASSIUM: 4.6 meq/L (ref 3.5–5.3)
Sodium: 137 mEq/L (ref 135–145)

## 2013-11-21 ENCOUNTER — Encounter: Payer: Self-pay | Admitting: Family

## 2013-12-16 ENCOUNTER — Other Ambulatory Visit: Payer: Self-pay | Admitting: Family

## 2014-02-08 ENCOUNTER — Encounter: Payer: Self-pay | Admitting: Family

## 2014-02-08 ENCOUNTER — Ambulatory Visit (INDEPENDENT_AMBULATORY_CARE_PROVIDER_SITE_OTHER): Payer: Medicare Other | Admitting: Family

## 2014-02-08 VITALS — BP 110/76 | HR 95 | Temp 98.8°F | Ht 59.0 in | Wt 132.0 lb

## 2014-02-08 DIAGNOSIS — I10 Essential (primary) hypertension: Secondary | ICD-10-CM

## 2014-02-08 DIAGNOSIS — E119 Type 2 diabetes mellitus without complications: Secondary | ICD-10-CM

## 2014-02-08 DIAGNOSIS — E785 Hyperlipidemia, unspecified: Secondary | ICD-10-CM

## 2014-02-08 DIAGNOSIS — Q613 Polycystic kidney, unspecified: Secondary | ICD-10-CM

## 2014-02-08 NOTE — Progress Notes (Signed)
Subjective:    Patient ID: Brittany Robles, female    DOB: Dec 19, 1940, 73 y.o.   MRN: [id number]  HPI  [name] is a 73 yr old female who presents today for follow up.  1) HTN- on amlodipine. Denies CP/SOB/swelling BP Readings from Last 3 Encounters:  02/08/14 110/76  11/04/13 140/80  08/01/13 130/74   2) Hyperlipidemia- On simvastatin, denies myalgias.  Lab Results  Component Value Date   CHOL 183 11/04/2013   HDL 68 11/04/2013   LDLCALC 94 11/04/2013   TRIG 106 11/04/2013   CHOLHDL 2.7 11/04/2013   3) DM2- reports sugars at home in the 80's. Reports good compliance with DM diet.   Lab Results  Component Value Date   HGBA1C 6.4* 10/24/2013   4) Polycystic kidney disease- declines blood draw today.    Review of Systems See HPI  Notes hand numbness  Is improving.  She reports that she would like a referral to pain management who will take [place] pain management.  Has a [place].  She will discuss with Dr. Penelope GalasBarko.    Past Medical History  Diagnosis Date  . Carpal tunnel syndrome, bilateral   . Hypertension   . Spinal stenosis of lumbar region   . Diabetes mellitus     Type 2  . Bowel obstruction   . 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 POLYCYSTIC KIDNEY DISEASE 10/11/2009  . Carotid artery stenosis 03/08/2012    Bilateral- 40-50% per duplex 7/13.  Needs follow up duplex in 1 year.   Marland Kitchen. PONV (postoperative nausea and vomiting)     History   Social History  . Marital Status: Divorced    Spouse Name: N/A    Number of Children: 3  . Years of Education: N/A   Occupational History  . Retired    Social History Main Topics  . Smoking status: Former Smoker    Quit date: 04/18/2010  . Smokeless tobacco: Not on file  . Alcohol Use: Yes  . Drug Use: No  . Sexual Activity: Not on file   Other Topics Concern  . 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 Small intestine surgery      for bowel obstruction  . Appendectomy    . Spine surgery      lumbar spinal stenosis  . Cesarean section      x 2  . Carpal tunnel release    . Breast surgery      bx,   neg  . Anterior cervical decompression/discectomy fusion 4 levels N/A 03/15/2013    Procedure: Cervical Three-Four Cervical Four-Five Cervical Five-Six Cervical Six-Seven Anterior cervical decompression/diskectomy/fusion;  Surgeon: Karn CassisErnesto M Botero, MD;  Location: [facility];  Service: Neurosurgery;  Laterality: N/A;  Cervical Three-Four Cervical Four-Five Cervical Five-Six Cervical Six-Seven Anterior cervical decompression/diskectomy/fusion    Family History  Problem Relation Age of Onset  . Cancer Other     Lung    Allergies  Allergen Reactions  . Cozaar [Losartan Potassium]     hyperkalemia  . Ibuprofen Other (See Comments)    Strange feelings and dreams  . Lisinopril Other (See Comments)    Light headed  . Medrol [Methylprednisolone]     Near syncope, low BP    Current Outpatient Prescriptions on  File Prior to Visit  Medication Sig Dispense Refill  . acetaminophen (TYLENOL) 325 MG tablet Take 650 mg by mouth every 6 (six) hours as needed for pain or fever.       Marland Kitchen. amLODipine (NORVASC) 10 MG tablet Take 1 tablet (10 mg total) by mouth daily.  30 tablet  2  . aspirin EC 81 MG tablet Take 81 mg by mouth daily.      . Calcium Carbonate-Vitamin D (CALTRATE 600+D) 600-400 MG-UNIT per tablet Take 1 tablet by mouth daily.      . DULoxetine (CYMBALTA) 30 MG capsule Take 30 mg by mouth 2 (two) times daily.       Marland Kitchen. gabapentin (NEURONTIN) 300 MG capsule Take 300 mg by mouth 3 (three) times daily.      Marland Kitchen. nystatin (MYCOSTATIN/NYSTOP) 100000 UNIT/GM POWD Apply 1 g topically 2 (two) times daily as needed (for rash).      . simvastatin (ZOCOR) 10 MG tablet TAKE ONE TABLET BY MOUTH AT BEDTIME  30 tablet  0  . sodium polystyrene (KAYEXALATE) 15  GM/60ML suspension 30 grams by mouth once now.  Further instructions per provider.  500 mL  0  . valACYclovir (VALTREX) 1000 MG tablet Take 1 tablet (1,000 mg total) by mouth daily.  30 tablet  5   No current facility-administered medications on file prior to visit.    BP 110/76  Pulse 95  Temp(Src) 98.8 F (37.1 C) (Oral)  Ht 4\' 11"  (1.499 m)  Wt 132 lb (59.875 kg)  BMI 26.65 kg/m2  SpO2 95%  LMP 08/19/1983       Objective:   Physical Exam  Constitutional: She is oriented to person, place, and time. She appears well-developed and well-nourished. No distress.  Cardiovascular: Normal rate and regular rhythm.   Pulmonary/Chest: Effort normal and breath sounds normal. No respiratory distress. She has no wheezes. She has no rales. She exhibits no tenderness.  Musculoskeletal: She exhibits no edema.  Neurological: She is alert and oriented to person, place, and time.  Skin: Skin is warm and dry.  Psychiatric: She has a normal mood and affect. Her behavior is normal. Judgment and thought content normal.          Assessment & Plan:

## 2014-02-08 NOTE — Patient Instructions (Signed)
Please schedule a follow up appointment in 3 months.

## 2014-02-08 NOTE — Progress Notes (Signed)
Pre visit review using our clinic review tool, if applicable. No additional management support is needed unless otherwise documented below in the visit note. 

## 2014-02-12 NOTE — Assessment & Plan Note (Signed)
Declines blood draw today.  Plan bmet next visit if pt is agreeable.

## 2014-02-12 NOTE — Assessment & Plan Note (Signed)
BP stable on current meds. Continue same.  

## 2014-02-12 NOTE — Assessment & Plan Note (Signed)
Stable on diabetic diet.   

## 2014-02-12 NOTE — Assessment & Plan Note (Signed)
Lipids stable on statin.

## 2014-02-16 ENCOUNTER — Other Ambulatory Visit: Payer: Self-pay | Admitting: Family

## 2014-02-16 NOTE — Telephone Encounter (Signed)
Rx request to pharmacy/[facility]  

## 2014-02-20 ENCOUNTER — Other Ambulatory Visit: Payer: Self-pay | Admitting: Family

## 2014-02-20 NOTE — Telephone Encounter (Signed)
Patient called back stating that it is the [facility] [address] in [place], not [place]

## 2014-02-20 NOTE — Telephone Encounter (Signed)
Patient is requesting a shingles rx to be [facility] [address] in [place]

## 2014-02-21 MED ORDER — ZOSTER VACCINE LIVE 19400 UNT/0.65ML ~~LOC~~ SOLR: 0.6500 mL | Freq: Once | SUBCUTANEOUS | Status: DC

## 2014-02-21 NOTE — Telephone Encounter (Signed)
Notified pt. 

## 2014-02-21 NOTE — Telephone Encounter (Signed)
[facility] [place] Pt aware. [facility]

## 2014-02-21 NOTE — Telephone Encounter (Signed)
Rx sent 

## 2014-02-21 NOTE — Addendum Note (Signed)
Addended by: Mervin KungFERGERSON, Hilarie Sinha A on: 02/21/2014 01:58 PM   Modules accepted: Orders

## 2014-02-21 NOTE — Telephone Encounter (Signed)
Patient calling back regarding this. [facility] [address] [facility] [place]

## 2014-02-21 NOTE — Telephone Encounter (Signed)
Is it ok to send rx? I have pended Rx.

## 2014-02-22 ENCOUNTER — Other Ambulatory Visit: Payer: Self-pay | Admitting: Family

## 2014-02-23 ENCOUNTER — Telehealth: Payer: Self-pay | Admitting: Family

## 2014-02-23 NOTE — Telephone Encounter (Signed)
Patient left message requesting [facility] [address]

## 2014-02-24 MED ORDER — AMLODIPINE BESYLATE 10 MG PO TABS: ORAL_TABLET | ORAL | Status: DC

## 2014-02-24 NOTE — Telephone Encounter (Signed)
Rx was previously sent on 02/16/14, #30 x no refills. Sent rx again.

## 2014-03-28 ENCOUNTER — Other Ambulatory Visit: Payer: Self-pay | Admitting: Otolaryngology

## 2014-03-28 DIAGNOSIS — R1310 Dysphagia, unspecified: Secondary | ICD-10-CM

## 2014-03-30 ENCOUNTER — Ambulatory Visit
Admission: RE | Admit: 2014-03-30 | Discharge: 2014-03-30 | Disposition: A | Discharge status: Home / Self Care | Payer: Medicare Other | Source: Ambulatory Visit | Attending: Otolaryngology | Admitting: Otolaryngology

## 2014-03-30 DIAGNOSIS — R1310 Dysphagia, unspecified: Secondary | ICD-10-CM

## 2014-05-01 ENCOUNTER — Telehealth: Payer: Self-pay | Admitting: Family

## 2014-05-01 MED ORDER — NYSTATIN 100000 UNIT/GM EX POWD: 1.0000 g | Freq: Two times a day (BID) | CUTANEOUS | Status: DC | PRN

## 2014-05-01 NOTE — Telephone Encounter (Signed)
Refill sent, left message on voicemail.

## 2014-05-01 NOTE — Telephone Encounter (Signed)
Error/gd °

## 2014-05-01 NOTE — Telephone Encounter (Signed)
Caller name:Hortencia Conradi Relation to RU:EAVW Call back number:[phone] Pharmacy: [facility] [place]  Reason for call: pt is needing new rx for nystatin (MYCOSTATIN/NYSTOP) 100000 UNIT/GM POWD.  Pt states she will be leaving to go out of town this evening and would like to pick up prior to leaving.

## 2014-05-08 ENCOUNTER — Ambulatory Visit: Payer: Medicare Other | Admitting: Family

## 2014-05-12 ENCOUNTER — Encounter: Payer: Self-pay | Admitting: Family

## 2014-05-12 ENCOUNTER — Ambulatory Visit (HOSPITAL_BASED_OUTPATIENT_CLINIC_OR_DEPARTMENT_OTHER)
Admission: RE | Admit: 2014-05-12 | Discharge: 2014-05-12 | Disposition: A | Discharge status: Home / Self Care | Payer: Medicare Other | Source: Ambulatory Visit | Attending: Family | Admitting: Family

## 2014-05-12 ENCOUNTER — Ambulatory Visit (INDEPENDENT_AMBULATORY_CARE_PROVIDER_SITE_OTHER): Payer: Medicare Other | Admitting: Family

## 2014-05-12 VITALS — BP 151/87 | HR 79 | Temp 98.5°F | Resp 14 | Ht 59.0 in | Wt 130.2 lb

## 2014-05-12 DIAGNOSIS — M545 Low back pain, unspecified: Secondary | ICD-10-CM | POA: Insufficient documentation

## 2014-05-12 DIAGNOSIS — L538 Other specified erythematous conditions: Secondary | ICD-10-CM

## 2014-05-12 DIAGNOSIS — Q762 Congenital spondylolisthesis: Secondary | ICD-10-CM | POA: Insufficient documentation

## 2014-05-12 DIAGNOSIS — R7309 Other abnormal glucose: Secondary | ICD-10-CM

## 2014-05-12 DIAGNOSIS — R739 Hyperglycemia, unspecified: Secondary | ICD-10-CM

## 2014-05-12 DIAGNOSIS — L304 Erythema intertrigo: Secondary | ICD-10-CM

## 2014-05-12 DIAGNOSIS — I10 Essential (primary) hypertension: Secondary | ICD-10-CM

## 2014-05-12 LAB — BASIC METABOLIC PANEL
BUN: 26 mg/dL — AB (ref 6–23)
CO2: 25 mEq/L (ref 19–32)
CREATININE: 1.3 mg/dL — AB (ref 0.4–1.2)
Calcium: 9.2 mg/dL (ref 8.4–10.5)
Chloride: 103 mEq/L (ref 96–112)
GFR: 49.89 mL/min — AB (ref >60.00)
GLUCOSE: 126 mg/dL — AB (ref 70–99)
POTASSIUM: 4.1 meq/L (ref 3.5–5.1)
Sodium: 135 mEq/L (ref 135–145)

## 2014-05-12 LAB — HEMOGLOBIN A1C: Hgb A1c MFr Bld: 6.6 % — ABNORMAL HIGH (ref 4.6–6.5)

## 2014-05-12 MED ORDER — FLUCONAZOLE 50 MG PO TABS: 50.0000 mg | ORAL_TABLET | Freq: Every day | ORAL | Status: DC

## 2014-05-12 MED ORDER — TRAMADOL HCL 50 MG PO TABS
50.0000 mg | ORAL_TABLET | Freq: Three times a day (TID) | ORAL | Status: DC | PRN
Start: 2014-05-12 — End: 2014-05-29

## 2014-05-12 NOTE — Progress Notes (Signed)
Subjective:    Patient ID: Brittany Robles, female    DOB: 09/19/1940, 73 y.o.   MRN: [id number]  HPI 73 y/o female presents today for rash under breast for 2 weeks unresolved by Nystatin powder. Rash has been going on/off for about a year. Described as itchy. Denies any other areas.   Back pain - had previous surgery. Describes "annoying" pain in the lumbar region. Rates the pain 8/10. Laying down improves the pain and walking and standing increases. Denies radiation, numbness or weakness.    Review of Systems    General: denies fevers, chills and nighsweats. Skin: Positive for rash under left breast as described in HPI Musculoskeletal: Postiive for low back pain.   Past Medical History  Diagnosis Date  . Carpal tunnel syndrome, bilateral   . Hypertension   . Spinal stenosis of lumbar region   . Diabetes mellitus     Type 2  . Bowel obstruction   . 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 POLYCYSTIC KIDNEY DISEASE 10/11/2009  . Carotid artery stenosis 03/08/2012    Bilateral- 40-50% per duplex 7/13.  Needs follow up duplex in 1 year.   Marland Kitchen PONV (postoperative nausea and vomiting)     History   Social History  . Marital Status: Divorced    Spouse Name: N/A    Number of Children: 3  . Years of Education: N/A   Occupational History  . Retired    Social History Main Topics  . Smoking status: Former Smoker    Quit date: 04/18/2010  . Smokeless tobacco: Not on file  . Alcohol Use: Yes  . Drug Use: No  . Sexual Activity: Not on file   Other Topics Concern  . 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 Small intestine surgery      for bowel obstruction  . Appendectomy    . Spine surgery      lumbar spinal stenosis  . Cesarean section      x 2  . Carpal tunnel release    . Breast surgery      bx,   neg  .  Anterior cervical decompression/discectomy fusion 4 levels N/A 03/15/2013    Procedure: Cervical Three-Four Cervical Four-Five Cervical Five-Six Cervical Six-Seven Anterior cervical decompression/diskectomy/fusion;  Surgeon: Karn Cassis, MD;  Location: [facility];  Service: Neurosurgery;  Laterality: N/A;  Cervical Three-Four Cervical Four-Five Cervical Five-Six Cervical Six-Seven Anterior cervical decompression/diskectomy/fusion    Family History  Problem Relation Age of Onset  . Cancer Other     Lung    Allergies  Allergen Reactions  . Cozaar [Losartan Potassium]     hyperkalemia  . Ibuprofen Other (See Comments)    Strange feelings and dreams  . Lisinopril Other (See Comments)    Light headed  . Medrol [Methylprednisolone]     Near syncope, low BP    Current Outpatient Prescriptions on File Prior to Visit  Medication Sig Dispense Refill  . acetaminophen (TYLENOL) 325 MG tablet Take 650 mg by mouth every 6 (six) hours as needed for pain or fever.       Marland Kitchen amLODipine (NORVASC) 10 MG tablet TAKE ONE TABLET BY MOUTH ONCE DAILY  30 tablet  0  . aspirin EC 81 MG tablet Take 81 mg by mouth  daily.      . Calcium Carbonate-Vitamin D (CALTRATE 600+D) 600-400 MG-UNIT per tablet Take 1 tablet by mouth daily.      Marland Kitchen nystatin (MYCOSTATIN/NYSTOP) 100000 UNIT/GM POWD Apply 1 g topically 2 (two) times daily as needed (for rash).  30 g  0  . sodium polystyrene (KAYEXALATE) 15 GM/60ML suspension 30 grams by mouth once now.  Further instructions per provider.  500 mL  0  . valACYclovir (VALTREX) 1000 MG tablet Take 1 tablet (1,000 mg total) by mouth daily.  30 tablet  5   No current facility-administered medications on file prior to visit.    BP 151/87  Pulse 79  Temp(Src) 98.5 F (36.9 C) (Oral)  Resp 14  Ht  (1.499 m)  Wt 130 lb 4 oz (59.081 kg)  BMI 26.29 kg/m2  SpO2 100%  LMP 08/19/1983    Objective:   Physical Exam    General: Seated on the exam table in NAD.  Dressed appropriately for the situation. Cardiovascular: regular rate and rhythm, no murmurs, gallops or rubs.  Respiratory: Regular, easy and symmetrical. No adventitious sounds present.  Skin: Discolored yeast appearing rash under left breast. Musculoskeletal: No obvious edema, deformity or discoloration. No palpable muscle spasm. Mild increase in pain with SLR on right.  Neurological: Alert and oriented. Patellar reflexes intact, equal and appropriate.       Assessment & Plan:  Pt was seen along with Marcos Eke FNP for training and orientation purposes.  I have personally examined and interviewed pt and agree with assessment and plan

## 2014-05-12 NOTE — Assessment & Plan Note (Signed)
Complete x ray of your spine on the first floor.   For back pain, you may use tylenol as needed. If pain is severe, you may use tramadol.

## 2014-05-12 NOTE — Assessment & Plan Note (Signed)
Will rx with diflucan, continue nystatin powder.

## 2014-05-12 NOTE — Progress Notes (Signed)
Pre visit review using our clinic review tool, if applicable. No additional management support is needed unless otherwise documented below in the visit note/SLS  

## 2014-05-12 NOTE — Assessment & Plan Note (Signed)
BP up slightly today.  I suspect that this is due to pain. Will plan to bring pt back in 1 month for BP recheck.

## 2014-05-12 NOTE — Patient Instructions (Addendum)
Start diflucan once daily for 7 days for rash. You may continue the nystatin powder. Complete x ray of your spine on the first floor.   For back pain, you may use tylenol as needed. If pain is severe, you may use tramadol. Follow up in 1 month.

## 2014-05-14 ENCOUNTER — Encounter: Payer: Self-pay | Admitting: Family

## 2014-05-29 ENCOUNTER — Other Ambulatory Visit: Payer: Self-pay | Admitting: Family

## 2014-05-29 NOTE — Telephone Encounter (Signed)
[facility] Tramadol 50 mg Last filled - 09.25.15, #30x0 Last AEX - 09.25.15 Next AEX - 1 Mth. Please Advise on refills/SLS

## 2014-05-30 NOTE — Telephone Encounter (Signed)
Rx faxed to pharmacy  

## 2014-05-30 NOTE — Telephone Encounter (Signed)
See rx. 

## 2014-06-26 ENCOUNTER — Other Ambulatory Visit: Payer: Self-pay | Admitting: Family

## 2014-06-26 NOTE — Telephone Encounter (Signed)
Last rx 05/30/14.  Please advise.  Medication name:  Name from pharmacy:  traMADol (ULTRAM) 50 MG tablet TRAMADOL HCL 50MG  TAB     Sig: TAKE ONE TABLET BY MOUTH EVERY 8 HOURS AS NEEDED    Dispense: 30 tablet   Refills: 0   Start: 06/26/2014   Class: Normal    Requested on: 05/30/2014    Originally ordered on: 05/12/2014 05/30/2014

## 2014-06-27 NOTE — Telephone Encounter (Signed)
Rx called to pharmacy voicemail. 

## 2014-06-27 NOTE — Telephone Encounter (Signed)
Ok

## 2014-07-18 ENCOUNTER — Other Ambulatory Visit: Payer: Self-pay | Admitting: Family

## 2014-07-18 NOTE — Telephone Encounter (Signed)
30 day supply amlodipine sent to pharmacy. Pt last seen by PCP 04/2014 and advised 1 month f/u and is past due.  Please call pt to arrange f/u before further refills are due.

## 2014-07-26 ENCOUNTER — Other Ambulatory Visit: Payer: Self-pay | Admitting: Family

## 2014-07-26 NOTE — Telephone Encounter (Signed)
Answering as Efraim KaufmannMelissa is out of office.  Will give one refill.  [facility]. Rx printed and signed. Please phone-in to pharmacy.  Thank you.

## 2014-08-09 ENCOUNTER — Other Ambulatory Visit: Payer: Self-pay | Admitting: Family

## 2014-08-19 ENCOUNTER — Other Ambulatory Visit: Payer: Self-pay | Admitting: Physician Assistant

## 2014-08-21 NOTE — Telephone Encounter (Signed)
Medication Detail      Disp Refills Start End     traMADol (ULTRAM) 50 MG tablet 30 tablet 0 07/26/2014     Sig: TAKE ONE TABLET BY MOUTH EVERY 8 HOURS AS NEEDED    Class: Print    LAST OV: 09.25.15 RETURN: 1-Mth. Please Advise on refills/SLS

## 2014-08-22 NOTE — Telephone Encounter (Signed)
Rx faxed to pharmacy  

## 2014-08-22 NOTE — Telephone Encounter (Signed)
Ok to send 30 tabs zero refills. 

## 2014-08-22 NOTE — Telephone Encounter (Signed)
Rx printed and forwarded to Provider for signature. 

## 2014-09-06 ENCOUNTER — Ambulatory Visit (INDEPENDENT_AMBULATORY_CARE_PROVIDER_SITE_OTHER): Payer: Medicare Other | Admitting: Family

## 2014-09-06 ENCOUNTER — Encounter: Payer: Self-pay | Admitting: Family

## 2014-09-06 VITALS — BP 118/84 | HR 72 | Temp 98.4°F | Resp 16 | Ht 59.0 in | Wt 128.8 lb

## 2014-09-06 DIAGNOSIS — M5430 Sciatica, unspecified side: Secondary | ICD-10-CM

## 2014-09-06 DIAGNOSIS — I10 Essential (primary) hypertension: Secondary | ICD-10-CM | POA: Diagnosis not present

## 2014-09-06 DIAGNOSIS — E119 Type 2 diabetes mellitus without complications: Secondary | ICD-10-CM

## 2014-09-06 DIAGNOSIS — Z13820 Encounter for screening for osteoporosis: Secondary | ICD-10-CM

## 2014-09-06 DIAGNOSIS — Z79899 Other long term (current) drug therapy: Secondary | ICD-10-CM | POA: Diagnosis not present

## 2014-09-06 DIAGNOSIS — Z23 Encounter for immunization: Secondary | ICD-10-CM

## 2014-09-06 DIAGNOSIS — Z0000 Encounter for general adult medical examination without abnormal findings: Secondary | ICD-10-CM | POA: Diagnosis not present

## 2014-09-06 LAB — BASIC METABOLIC PANEL
BUN: 32 mg/dL — ABNORMAL HIGH (ref 6–23)
CO2: 30 meq/L (ref 19–32)
CREATININE: 1.23 mg/dL — AB (ref 0.40–1.20)
Calcium: 9.6 mg/dL (ref 8.4–10.5)
Chloride: 105 mEq/L (ref 96–112)
GFR: 55.03 mL/min — ABNORMAL LOW (ref >60.00)
GLUCOSE: 132 mg/dL — AB (ref 70–99)
POTASSIUM: 4.4 meq/L (ref 3.5–5.1)
SODIUM: 140 meq/L (ref 135–145)

## 2014-09-06 LAB — HEMOGLOBIN A1C: HEMOGLOBIN A1C: 6.7 % — AB (ref 4.6–6.5)

## 2014-09-06 MED ORDER — TRAMADOL HCL 50 MG PO TABS: 50.0000 mg | ORAL_TABLET | Freq: Three times a day (TID) | ORAL | Status: DC | PRN

## 2014-09-06 NOTE — Assessment & Plan Note (Addendum)
Fasting blood sugars run around 80. Check A1c.  Addendum:   Lab Results  Component Value Date   HGBA1C 6.7* 09/06/2014   A1C up slightly, work on diabetic diet.

## 2014-09-06 NOTE — Assessment & Plan Note (Signed)
BP stable on amlodipine. Check BMET. Follow up in 6 months.

## 2014-09-06 NOTE — Assessment & Plan Note (Signed)
Back pain unimproved. Tramadol helps a limited amount. Refuses PT due to cost. Also refuses MRI. Will continue refills for tramadol and give handout with back exercises. Will do UDS today.

## 2014-09-06 NOTE — Patient Instructions (Signed)
Please complete lab work prior to leaving. Call if back pain worsens or if no improvement with exercises. Follow up in 6 months.

## 2014-09-06 NOTE — Progress Notes (Signed)
Subjective:    Patient ID: Brittany Robles, female    DOB: 1941-03-19, 74 y.o.   MRN: [id number]  HPI  Subjective:    Brittany Robles is a 74 y.o. female who presents for Medicare Annual/Subsequent preventive examination.  Preventive Screening-Counseling & Management  Tobacco History  Smoking status  . Former Smoker  . Quit date: 04/18/2010  Smokeless tobacco  . Not on file     Problems Prior to Visit 1. States she still has the pain in back which has not improved. It radiates to bottom of buttocks and is 8/10. Still taking Tramadol- relieves pain somewhat but not all the way.  Denies numbness ,tingling in legs. Denies bladder/bowel incontinence.  2. HTN-  Maintained on amlodipine.   BP Readings from Last 3 Encounters:  09/06/14 118/84  05/12/14 151/87  02/08/14 110/76   3.  DM2-  Reports that fasting sugar around 80.   Lab Results  Component Value Date   HGBA1C 6.6* 05/12/2014   HGBA1C 6.4* 10/24/2013   HGBA1C 6.6* 05/09/2013   Lab Results  Component Value Date   MICROALBUR 0.87 02/02/2013   LDLCALC 94 11/04/2013   CREATININE 1.3* 05/12/2014     Current Problems (verified) Patient Active Problem List   Diagnosis Date Noted  . Midline low back pain without sciatica 05/12/2014  . Cervical disc disease 10/26/2012  . Numbness of fingers of both hands 09/10/2012  . Neck strain 08/13/2012  . Sciatica 06/08/2012  . Routine gynecological examination 06/08/2012  . Carotid artery stenosis 03/08/2012  . Abnormal EKG 03/02/2012  . DDD (degenerative disc disease), cervical 02/25/2012  . Intertrigo 11/25/2011  . Post-menopausal bleeding 11/25/2011  . 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  . 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 Galactorrhea of right breast 05/26/2011  . POLYCYSTIC KIDNEY DISEASE 10/11/2009  . HYPERLIPIDEMIA 08/16/2009  . History of tobacco abuse 08/16/2009  . RENAL DISEASE, CHRONIC, MILD 06/26/2009  . DIABETES MELLITUS, TYPE II 09/25/2008  .  CARPAL TUNNEL SYNDROME 08/24/2008  . HYPERTENSION, BENIGN ESSENTIAL 08/24/2008    Medications Prior to Visit Current Outpatient Prescriptions on File Prior to Visit  Medication Sig Dispense Refill  . acetaminophen (TYLENOL) 325 MG tablet Take 650 mg by mouth every 6 (six) hours as needed for pain or fever.     Marland Kitchen amLODipine (NORVASC) 10 MG tablet TAKE ONE TABLET BY MOUTH ONCE DAILY 30 tablet 0  . aspirin EC 81 MG tablet Take 81 mg by mouth daily.    . Calcium Carbonate-Vitamin D (CALTRATE 600+D) 600-400 MG-UNIT per tablet Take 1 tablet by mouth daily.    . fluconazole (DIFLUCAN) 50 MG tablet Take 1 tablet (50 mg total) by mouth daily. 7 tablet 0  . nystatin (MYCOSTATIN/NYSTOP) 100000 UNIT/GM POWD Apply 1 g topically 2 (two) times daily as needed (for rash). 30 g 0  . sodium polystyrene (KAYEXALATE) 15 GM/60ML suspension 30 grams by mouth once now.  Further instructions per provider. 500 mL 0  . traMADol (ULTRAM) 50 MG tablet TAKE ONE TABLET BY MOUTH EVERY 8 HOURS AS NEEDED 30 tablet 0  . valACYclovir (VALTREX) 1000 MG tablet TAKE ONE TABLET BY MOUTH ONCE DAILY 30 tablet 3   No current facility-administered medications on file prior to visit.    Current Medications (verified) Current Outpatient Prescriptions  Medication Sig Dispense Refill  . acetaminophen (TYLENOL) 325 MG tablet Take 650 mg by mouth every 6 (six) hours as needed for pain or fever.     Marland Kitchen amLODipine (  NORVASC) 10 MG tablet TAKE ONE TABLET BY MOUTH ONCE DAILY 30 tablet 0  . aspirin EC 81 MG tablet Take 81 mg by mouth daily.    . Calcium Carbonate-Vitamin D (CALTRATE 600+D) 600-400 MG-UNIT per tablet Take 1 tablet by mouth daily.    . fluconazole (DIFLUCAN) 50 MG tablet Take 1 tablet (50 mg total) by mouth daily. 7 tablet 0  . nystatin (MYCOSTATIN/NYSTOP) 100000 UNIT/GM POWD Apply 1 g topically 2 (two) times daily as needed (for rash). 30 g 0  . sodium polystyrene (KAYEXALATE) 15 GM/60ML suspension 30 grams by mouth once now.   Further instructions per provider. 500 mL 0  . traMADol (ULTRAM) 50 MG tablet TAKE ONE TABLET BY MOUTH EVERY 8 HOURS AS NEEDED 30 tablet 0  . valACYclovir (VALTREX) 1000 MG tablet TAKE ONE TABLET BY MOUTH ONCE DAILY 30 tablet 3   No current facility-administered medications for this visit.     Allergies (verified) Cozaar; Ibuprofen; Lisinopril; and Medrol   PAST HISTORY  Family History Family History  Problem Relation Age of Onset  . Cancer Other     Lung  . Cancer Mother     lung  . Cancer Sister     throat  . Cancer Brother     lung    Social History History  Substance Use Topics  . Smoking status: Former Smoker    Quit date: 04/18/2010  . Smokeless tobacco: Not on file  . Alcohol Use: Yes     Are there smokers in your home (other than you)? No  Risk Factors Current exercise habits: walks  almost every day for 10 minutes. This does cause her back to hurt.  Dietary issues discussed: eats plenty of fruits and vegetables. rthe past few years she tates appetite is not very good and she feels full quickly  Cardiac risk factors: advanced age (older than 59 for men, 50 for women), diabetes mellitus, dyslipidemia and hypertension.  Depression Screen (Note: if answer to either of the following is "Yes", a more complete depression screening is indicated)   Over the past two weeks, have you felt down, depressed or hopeless? No  Over the past two weeks, have you felt little interest or pleasure in doing things? no  Have you lost interest or pleasure in daily life? No  Do you often feel hopeless? No  Do you cry easily over simple problems? No  Activities of Daily Living In your present state of health, do you have any difficulty performing the following activities?:  Driving? No Managing money?  No Feeding yourself? No Getting from bed to chair? No                                                              climbing a flight of stairs? No Preparing  food and eating?: No Bathing or showering? No Getting dressed: No Getting to the toilet? No Using the toilet:No Moving around from place to place: No In the past year have you fallen or had a near fall?:No   Are you sexually active?  No  Do you have more than one partner?  No  Hearing Difficulties: No Do you often ask people to speak up or repeat themselves? No Do you experience ringing or noises in your ears? No  Do you have difficulty understanding soft or whispered voices? No   Do you feel that you have a problem with memory? No  Do you often misplace items? No  Do you feel safe at home?  Yes  Cognitive Testing  Alert? Yes  Normal Appearance?Yes  Oriented to person? Yes  Place? Yes   Time? Yes  Recall of three objects?  Yes  Can perform simple calculations? Yes  Displays appropriate judgment?Yes  Can read the correct time from a watch face?Yes   Advanced Directives have been discussed with the patient? Yes  List the Names of Other Physician/Practitioners you currently use: 1.  Dr. Jeral FruitBotero- neurosurgery  Indicate any recent Medical Services you may have received from other than [facility] providers in the past year (date may be approximate).  Immunization History  Administered Date(s) Administered  . Influenza Split 05/10/2012  . Influenza,inj,Quad PF,36+ Mos 05/06/2013  . Influenza-Unspecified 04/18/2014  . Pneumococcal Conjugate-13 08/01/2013  . Pneumococcal Polysaccharide-23 06/13/2008  . Td 01/01/2004  . Zoster 02/15/2014    Screening Tests Health Maintenance  Topic Date Due  . FOOT EXAM  02/11/2012  . OPHTHALMOLOGY EXAM  05/11/2013  . TETANUS/TDAP  12/31/2013  . URINE MICROALBUMIN  02/02/2014  . HEMOGLOBIN A1C  11/10/2014  . INFLUENZA VACCINE  03/19/2015  . COLONOSCOPY  10/16/2015  . MAMMOGRAM  10/25/2015  . DEXA SCAN  Completed  . PNEUMOCOCCAL POLYSACCHARIDE VACCINE AGE 37 AND OVER  Completed  . ZOSTAVAX  Completed    All answers were reviewed with the  patient and necessary referrals were made:  O'SULLIVAN,Arda Keadle S., NP   09/06/2014   History reviewed: allergies, current medications, past family history, past medical history, past social history, past surgical history and problem list  Review of Systems Pertinent items are noted in HPI.    Objective:     Vision by Snellen chart: right eye:see nursing, left eye:see nursing  Body mass index is 26 kg/(m^2). BP 118/84 mmHg  Pulse 72  Temp(Src) 98.4 F (36.9 C) (Oral)  Resp 16  Ht 4\' 11"  (1.499 m)  Wt 128 lb 12.8 oz (58.423 kg)  BMI 26.00 kg/m2  SpO2 98%  LMP 08/19/1983  Physical Exam  Constitutional: She is oriented to person, place, and time. She appears well-developed and well-nourished. No distress.  HENT:  Head: Normocephalic and atraumatic.  Right Ear: Tympanic membrane and ear canal normal.  Left Ear: Tympanic membrane and ear canal normal.  Mouth/Throat: Oropharynx is clear and moist.  Eyes: Pupils are equal, round, and reactive to light. No scleral icterus.  Neck: Normal range of motion. No thyromegaly present.  Cardiovascular: Normal rate and regular rhythm.   No murmur heard. Pulmonary/Chest: Effort normal and breath sounds normal. No respiratory distress. He has no wheezes. She has no rales. She exhibits no tenderness.  Abdominal: Soft. Bowel sounds are normal. He exhibits no distension and no mass. There is no tenderness. There is no rebound and no guarding.  Musculoskeletal: She exhibits no edema.  Lymphadenopathy:    She has no cervical adenopathy.  Neurological: She is alert and oriented to person, place, and time. She has normal reflexes. She exhibits normal muscle tone. Coordination normal.  Skin: Skin is warm and dry.  Psychiatric: She has a normal mood and affect. Her behavior is normal. Judgment and thought content normal.  Breasts: Examined lying Breast/pelvic deferred          Assessment & Plan:   1.  Preventative care- will refer for  dexa  Assessment:      Plan:     During the course of the visit the patient was educated and counseled about appropriate screening and preventive services including:   Diet review for nutrition referral? Yes ____  Not Indicated _x___   Patient Instructions (the written plan) was given to the patient.  Medicare Attestation I have personally reviewed: The patient's medical and social history Their use of alcohol, tobacco or illicit drugs Their current medications and supplements The patient's functional ability including ADLs,fall risks, home safety risks, cognitive, and hearing and visual impairment Diet and physical activities Evidence for depression or mood disorders  The patient's weight, height, BMI, and visual acuity have been recorded in the chart.  I have made referrals, counseling, and provided education to the patient based on review of the above and I have provided the patient with a written personalized care plan for preventive services.     O'SULLIVAN,Trayton Szabo S., NP   09/06/2014        Review of Systems  Constitutional: Negative for fever, chills, fatigue and unexpected weight change.  HENT: Positive for trouble swallowing. Negative for hearing loss.        Has had trouble swallowing since cervical disk surgery. Has to chew food very well and take more time eating.  Eyes: Negative for visual disturbance.       Last eye visit was almost a year ago and she has an appointment next month.  Respiratory: Negative for cough, choking, chest tightness and shortness of breath.   Cardiovascular: Negative for chest pain, palpitations and leg swelling.  Gastrointestinal: Negative for abdominal pain, diarrhea, constipation and blood in stool.  Endocrine: Negative for cold intolerance and heat intolerance.  Genitourinary: Negative for dysuria, vaginal bleeding and vaginal pain.       Denies burning, frequency.  Musculoskeletal: Negative for myalgias.       Sometimes gets  cramps in left foot.  Skin: Negative for rash.  Allergic/Immunologic: Negative for environmental allergies and food allergies.  Neurological: Negative for dizziness, tremors, weakness and headaches.  Hematological: Negative for adenopathy.  Psychiatric/Behavioral:       Denies depression/anxiety.      Objective:   Physical Exam  Constitutional: She is oriented to person, place, and time. She appears well-developed and well-nourished. No distress.  HENT:  Head: Normocephalic and atraumatic.  Right Ear: Tympanic membrane normal.  Left Ear: Tympanic membrane normal.  Mouth/Throat: Oropharynx is clear and moist. No oropharyngeal exudate, posterior oropharyngeal edema or posterior oropharyngeal erythema.  Eyes: EOM are normal. Pupils are equal, round, and reactive to light. Right conjunctiva is not injected.  Neck: Neck supple. No thyromegaly present.  Cardiovascular: Normal rate, regular rhythm, normal heart sounds and intact distal pulses.  Exam reveals no gallop and no friction rub.   No murmur heard. Pulmonary/Chest: Effort normal and breath sounds normal. No respiratory distress. She has no wheezes. She has no rales.  Abdominal: Soft. Bowel sounds are normal. She exhibits no distension and no mass. There is no tenderness. There is no rebound and no guarding.  Musculoskeletal: She exhibits no edema.  Lymphadenopathy:    She has no cervical adenopathy.  Neurological: She is alert and oriented to person, place, and time.  Skin: Skin is warm and dry. No rash noted. She is not diaphoretic.  Psychiatric: She has a normal mood and affect. Her behavior is normal. Judgment and thought content normal.          Assessment & Plan:  Patient  seen along with [facility] NP-student.  I have personally seen and examined patient and agree with Ms. Whitmire's assessment and plan- Sandford Craze NP

## 2014-09-08 ENCOUNTER — Encounter: Payer: Self-pay | Admitting: Family

## 2014-09-09 DIAGNOSIS — I10 Essential (primary) hypertension: Secondary | ICD-10-CM | POA: Insufficient documentation

## 2014-09-09 NOTE — Assessment & Plan Note (Signed)
Stable on amlodipine. Continue same.  

## 2014-09-15 ENCOUNTER — Telehealth: Payer: Self-pay | Admitting: Family

## 2014-09-15 NOTE — Telephone Encounter (Signed)
uds showed + for alcohol.  Had she had any alcohol that day or the day before?  If not, then I would like her to complete a urine culture because sometimes UTI can cause the urine to show + for ETOH. She should not drink alcohol while using tramadol.

## 2014-09-15 NOTE — Telephone Encounter (Signed)
Notified pt and she states she did have alcohol prior to her appt. Reminded pt per instructions below to avoid alcohol while taking Tramadol and she voices understanding.

## 2014-10-05 ENCOUNTER — Other Ambulatory Visit: Payer: Self-pay | Admitting: Family

## 2014-10-05 NOTE — Telephone Encounter (Signed)
Pt can pick up refill, needs repeat uds please.

## 2014-10-05 NOTE — Telephone Encounter (Signed)
Called and informed the pt of note below.  Pt verbalized understanding and agreed.  Informed the pt that rx will be placed up front for her to pick.  Pt agreed.//AB/CMA

## 2014-10-05 NOTE — Telephone Encounter (Signed)
Rquesting Tramadol 50mg -Take 1 tablet by mouth every 8 hours as needed. Last refill:09/06/14;#30,0 Last OV: 09/06/14-Next appt-03/07/15 UDS:09/06/14 Please advise.//AB/CMA

## 2014-10-06 DIAGNOSIS — Z79891 Long term (current) use of opiate analgesic: Secondary | ICD-10-CM | POA: Diagnosis not present

## 2014-10-09 ENCOUNTER — Telehealth: Payer: Self-pay | Admitting: Family

## 2014-10-09 NOTE — Telephone Encounter (Signed)
Caller name:Desa Netherton Relationship to patient:self Can be [phone] Pharmacy:  Reason for call:[phone]([phone]) will be calling regards to diabetic supplies and a brace for her lower back

## 2014-10-10 ENCOUNTER — Other Ambulatory Visit: Payer: Self-pay | Admitting: Family

## 2014-10-10 DIAGNOSIS — Z1231 Encounter for screening mammogram for malignant neoplasm of breast: Secondary | ICD-10-CM

## 2014-10-12 ENCOUNTER — Encounter: Payer: Self-pay | Admitting: Family

## 2014-10-31 ENCOUNTER — Ambulatory Visit (HOSPITAL_BASED_OUTPATIENT_CLINIC_OR_DEPARTMENT_OTHER)
Admission: RE | Admit: 2014-10-31 | Discharge: 2014-10-31 | Disposition: A | Discharge status: Home / Self Care | Payer: Medicare Other | Source: Ambulatory Visit | Attending: Family | Admitting: Family

## 2014-10-31 DIAGNOSIS — Z1231 Encounter for screening mammogram for malignant neoplasm of breast: Secondary | ICD-10-CM

## 2014-11-01 DIAGNOSIS — E119 Type 2 diabetes mellitus without complications: Secondary | ICD-10-CM | POA: Diagnosis not present

## 2014-11-01 DIAGNOSIS — H2513 Age-related nuclear cataract, bilateral: Secondary | ICD-10-CM | POA: Diagnosis not present

## 2014-11-01 DIAGNOSIS — H25013 Cortical age-related cataract, bilateral: Secondary | ICD-10-CM | POA: Diagnosis not present

## 2014-11-01 DIAGNOSIS — H40023 Open angle with borderline findings, high risk, bilateral: Secondary | ICD-10-CM | POA: Diagnosis not present

## 2014-11-01 LAB — HM DIABETES EYE EXAM

## 2014-11-22 ENCOUNTER — Encounter: Payer: Self-pay | Admitting: Family

## 2014-11-22 ENCOUNTER — Telehealth: Payer: Self-pay | Admitting: *Deleted

## 2014-11-22 MED ORDER — VALACYCLOVIR HCL 1 G PO TABS: 1000.0000 mg | ORAL_TABLET | Freq: Every day | ORAL | Status: DC

## 2014-11-22 MED ORDER — AMLODIPINE BESYLATE 10 MG PO TABS: 10.0000 mg | ORAL_TABLET | Freq: Every day | ORAL | Status: DC

## 2014-11-22 NOTE — Telephone Encounter (Signed)
Received fax from [facility] requesting refill of amlodipine. Verified with pt that she wants to use mail order. Also requests that we send [facility] Rx for them to place on hold. Rxs sent.

## 2014-12-08 ENCOUNTER — Ambulatory Visit: Payer: Medicare Other | Admitting: Family

## 2014-12-08 DIAGNOSIS — Z0289 Encounter for other administrative examinations: Secondary | ICD-10-CM

## 2014-12-08 NOTE — Telephone Encounter (Signed)
Spoke with Brittany Robles at [facility] [phone]). They had wrong fax #. Updated contact info and they will fax forms. Awaiting fax.

## 2014-12-12 NOTE — Telephone Encounter (Signed)
Received fax for diabetic testing supplies but did not receive order for back brace. Spoke with Shakima at [facility] and she states that [employer]'s insurance will not cover a back brace so they did not send that form. Diabetic testing supply form forwarded to Provider for signature.  Left message on pt's home # to return my call.

## 2014-12-13 NOTE — Telephone Encounter (Signed)
Notified pt and she states [facility] also notified her re: back brace. Pt wants to get diabetic testing supplies from them and states she will need everything (new meter as well). Form forwarded to Provider for signature.

## 2014-12-15 ENCOUNTER — Telehealth: Payer: Self-pay | Admitting: Family

## 2014-12-15 DIAGNOSIS — H40023 Open angle with borderline findings, high risk, bilateral: Secondary | ICD-10-CM | POA: Diagnosis not present

## 2014-12-15 NOTE — Telephone Encounter (Signed)
Received voicemail on cancellation/rescheduling line. Pt called 12/07/14 at 11:31am to cancel appt 12/08/14 at 2:00pm. She states she will call to reschedule. Not scheduled to come back until 03/07/15.

## 2014-12-19 ENCOUNTER — Encounter: Payer: Medicare Other | Admitting: Family

## 2014-12-19 DIAGNOSIS — Z79891 Long term (current) use of opiate analgesic: Secondary | ICD-10-CM | POA: Diagnosis not present

## 2014-12-19 NOTE — Addendum Note (Signed)
Addended by: Sandford Craze'SULLIVAN, Kamel [place] on: 12/19/2014 03:02 PM   Modules accepted: Level of Service

## 2014-12-19 NOTE — Progress Notes (Deleted)
Pre visit review using our clinic review tool, if applicable. No additional management support is needed unless otherwise documented below in the visit note. 

## 2014-12-19 NOTE — Progress Notes (Signed)
Pt brought in form for diabetic testing supplies and thought she needed an office visit. Per verbal from Provider, office visit not needed to complete form. Pt has appt on 03/07/15 with PCP. Void appt today.

## 2014-12-26 NOTE — Telephone Encounter (Signed)
Pt came in to the office on 12/19/14 with diabetic testing supply form stating she needed office visit to complete form. Advised pt office visit was not necessary as PCP had already approved supplies. Office visit was voided. Form now scanned into EPIC, printed and faxed to [facility] at [phone].

## 2014-12-29 ENCOUNTER — Telehealth: Payer: Self-pay | Admitting: Family

## 2014-12-29 NOTE — Telephone Encounter (Signed)
Pt states tramadol didn't seem to be helping her when she took it so she stopped it. Med list updated. Also states she has not been drinking alcohol either.

## 2014-12-29 NOTE — Telephone Encounter (Signed)
Could you please ask pt how often she is is using tramadol?

## 2015-01-02 ENCOUNTER — Telehealth: Payer: Self-pay | Admitting: Family

## 2015-01-02 NOTE — Telephone Encounter (Signed)
Caller name: Eunice BlaseDebbie, NP [facility] Can be reached: [phone]  Reason for call: Pt has irregularity to heart rate. Pt was unaware of it. Pt has now been informed. Pt is doing fine. BP 126/86, pulse 72. No distress. Just an FYI.

## 2015-01-03 NOTE — Telephone Encounter (Signed)
Please book pt for follow up office visit. We will need EKG. Thanks.

## 2015-01-03 NOTE — Telephone Encounter (Signed)
Pt returned call. Appt scheduled for 01/12/15 11:00am.

## 2015-01-03 NOTE — Telephone Encounter (Signed)
Left msg for pt to call in to schedule appt  °

## 2015-01-12 ENCOUNTER — Ambulatory Visit (INDEPENDENT_AMBULATORY_CARE_PROVIDER_SITE_OTHER): Payer: Medicare Other | Admitting: Family

## 2015-01-12 ENCOUNTER — Encounter: Payer: Self-pay | Admitting: Family

## 2015-01-12 VITALS — BP 130/80 | HR 76 | Temp 98.0°F | Resp 16 | Ht 59.0 in | Wt 132.2 lb

## 2015-01-12 DIAGNOSIS — I499 Cardiac arrhythmia, unspecified: Secondary | ICD-10-CM | POA: Insufficient documentation

## 2015-01-12 NOTE — Patient Instructions (Signed)
Please call me when you return to town and we will order your heart monitor.  Call if you develop recurrent "light headedness."  Go to ER if severe.

## 2015-01-12 NOTE — Progress Notes (Signed)
Subjective:    Patient ID: Brittany Robles, female    DOB: Dec 24, 1940, 74 y.o.   MRN: [id number]  HPI   Brittany Robles is a 74 yr old female who presents today due to finding of "irregular heart rate" by home health NP [facility]. Notes that at times she feels "like she can't catch her breath" and has to sit down for a few minutes. Last time this occurred about 1 month ago.  Denies Chest pain or palpitations. She is leaving this afternoon to go out of town and is not sure when she will return.     Review of Systems See HPI  Past Medical History  Diagnosis Date  . Carpal tunnel syndrome, bilateral   . Hypertension   . Spinal stenosis of lumbar region   . Diabetes mellitus     Type 2  . Bowel obstruction   . 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 POLYCYSTIC KIDNEY DISEASE 10/11/2009  . Carotid artery stenosis 03/08/2012    Bilateral- 40-50% per duplex 7/13.  Needs follow up duplex in 1 year.   Marland Kitchen. PONV (postoperative nausea and vomiting)     History   Social History  . Marital Status: Divorced    Spouse Name: N/A  . Number of Children: 3  . Years of Education: N/A   Occupational History  . Retired    Social History Main Topics  . Smoking status: Former Smoker    Quit date: 04/18/2010  . Smokeless tobacco: Not on file  . Alcohol Use: Yes  . Drug Use: No  . Sexual Activity: Not on file   Other Topics Concern  . 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 Small intestine surgery      for bowel obstruction  . Appendectomy    . Spine surgery      lumbar spinal stenosis  . Cesarean section      x 2  . Carpal tunnel release    . Breast surgery      bx,   neg  . Anterior cervical decompression/discectomy fusion 4 levels N/A 03/15/2013    Procedure: Cervical Three-Four Cervical Four-Five Cervical Five-Six  Cervical Six-Seven Anterior cervical decompression/diskectomy/fusion;  Surgeon: Karn CassisErnesto M Botero, MD;  Location: [facility];  Service: Neurosurgery;  Laterality: N/A;  Cervical Three-Four Cervical Four-Five Cervical Five-Six Cervical Six-Seven Anterior cervical decompression/diskectomy/fusion    Family History  Problem Relation Age of Onset  . Cancer Other     Lung  . Cancer Mother     lung  . Cancer Sister     throat  . Cancer Brother     lung  . CAD Son 48    stent    Allergies  Allergen Reactions  . Cozaar [Losartan Potassium]     hyperkalemia  . Ibuprofen Other (See Comments)    Strange feelings and dreams  . Lisinopril Other (See Comments)    Light headed  . Medrol [Methylprednisolone]     Near syncope, low BP    Current Outpatient Prescriptions on File Prior to Visit  Medication Sig Dispense Refill  . acetaminophen (TYLENOL) 325 MG tablet Take 650 mg by mouth every 6 (six) hours as needed for pain or fever.     Marland Kitchen. amLODipine (NORVASC) 10 MG  tablet Take 1 tablet (10 mg total) by mouth daily. 90 tablet 1  . aspirin EC 81 MG tablet Take 81 mg by mouth daily.    . Calcium Carbonate-Vitamin D (CALTRATE 600+D) 600-400 MG-UNIT per tablet Take 1 tablet by mouth daily.    . fluconazole (DIFLUCAN) 50 MG tablet Take 1 tablet (50 mg total) by mouth daily. 7 tablet 0  . nystatin (MYCOSTATIN/NYSTOP) 100000 UNIT/GM POWD Apply 1 g topically 2 (two) times daily as needed (for rash). 30 g 0  . sodium polystyrene (KAYEXALATE) 15 GM/60ML suspension 30 grams by mouth once now.  Further instructions per provider. 500 mL 0  . valACYclovir (VALTREX) 1000 MG tablet Take 1 tablet (1,000 mg total) by mouth daily. 90 tablet 1   No current facility-administered medications on file prior to visit.    BP 130/80 mmHg  Pulse 76  Temp(Src) 98 F (36.7 C) (Oral)  Resp 16  Ht  (1.499 m)  Wt 132 lb 3.2 oz (59.966 kg)  BMI 26.69 kg/m2  SpO2 99%  LMP 08/19/1983       Objective:    Physical Exam  Constitutional: She is oriented to person, place, and time. She appears well-developed and well-nourished.  HENT:  Head: Normocephalic.  Cardiovascular: Normal rate, regular rhythm and normal heart sounds.   No murmur heard. Pulmonary/Chest: Effort normal and breath sounds normal. No respiratory distress. She has no wheezes.  Neurological: She is alert and oriented to person, place, and time.  Psychiatric: She has a normal mood and affect. Her behavior is normal. Judgment and thought content normal.          Assessment & Plan:

## 2015-01-12 NOTE — Assessment & Plan Note (Signed)
EKG is reviewed today in the office. NSR with PAC's. I advised pt that we should have her complete a holter monitor for further evaluation of possible PAF. She wishes to defer until she returns from her trip and will contact me upon her return.

## 2015-01-12 NOTE — Progress Notes (Signed)
Pre visit review using our clinic review tool, if applicable. No additional management support is needed unless otherwise documented below in the visit note. 

## 2015-01-31 DIAGNOSIS — H40023 Open angle with borderline findings, high risk, bilateral: Secondary | ICD-10-CM | POA: Diagnosis not present

## 2015-02-08 ENCOUNTER — Telehealth: Payer: Self-pay | Admitting: *Deleted

## 2015-02-08 DIAGNOSIS — I499 Cardiac arrhythmia, unspecified: Secondary | ICD-10-CM

## 2015-02-08 NOTE — Telephone Encounter (Signed)
Notified pt and she voices understanding. 

## 2015-02-08 NOTE — Telephone Encounter (Signed)
Received call from pt reporting episode yesterday around 2pm of excessive sweating and feeling lightheaded after returning from the grocery store. Pt denied any: n/v, chest/neck/jaw or back pain, no SOB, no swelling of feet/hands or legs, no rapid heart beat. Notes that she sat down for a few minutes and ate and she felt better. Reports that she had not eaten anything that day. Pt denies any symptoms today and states she felt this way once about 1 yr ago. She wasn't sure if this was related to the irregular heart rate she was told she has.  Also pt was advised to have holter monitor but declined because she was going out of town and states she is not back home and can proceed with holter monitor (did not see referral in EPIC).  Please advise.

## 2015-02-08 NOTE — Telephone Encounter (Signed)
Sounds like most likely due to heat/dehydration. Go to ER if recurrent symptoms. Otherwise advise regular meals, adequate fluid intake. Will order holter monitor.

## 2015-02-12 ENCOUNTER — Ambulatory Visit (INDEPENDENT_AMBULATORY_CARE_PROVIDER_SITE_OTHER): Payer: Medicare Other

## 2015-02-12 DIAGNOSIS — I499 Cardiac arrhythmia, unspecified: Secondary | ICD-10-CM

## 2015-02-21 ENCOUNTER — Telehealth: Payer: Self-pay | Admitting: Family

## 2015-02-21 NOTE — Telephone Encounter (Signed)
Caller name:Rhenda Relationship to patient: self Can be reached:[phone] Pharmacy:  Reason for call: would like the results of the heart monitor

## 2015-02-22 NOTE — Telephone Encounter (Signed)
I have not received the results yet. Could you please contact [facility] and inquire re: status of holter results?

## 2015-02-23 NOTE — Telephone Encounter (Signed)
LM for patient to return the call.  

## 2015-02-23 NOTE — Telephone Encounter (Signed)
Reviewed results. Results normal.  Just a few PVC's which are early beats that can cause sensation of palpitations but are not dangerous.

## 2015-02-26 NOTE — Telephone Encounter (Signed)
Patient returned call. Advised per recommendations.

## 2015-03-01 ENCOUNTER — Ambulatory Visit (INDEPENDENT_AMBULATORY_CARE_PROVIDER_SITE_OTHER): Payer: Medicare Other | Admitting: Family

## 2015-03-01 ENCOUNTER — Encounter: Payer: Self-pay | Admitting: Family

## 2015-03-01 VITALS — BP 122/80 | HR 78 | Temp 98.1°F | Resp 16 | Ht 59.0 in | Wt 133.2 lb

## 2015-03-01 DIAGNOSIS — H25013 Cortical age-related cataract, bilateral: Secondary | ICD-10-CM | POA: Diagnosis not present

## 2015-03-01 DIAGNOSIS — M67879 Other specified disorders of synovium and tendon, unspecified ankle and foot: Secondary | ICD-10-CM | POA: Diagnosis not present

## 2015-03-01 DIAGNOSIS — H2513 Age-related nuclear cataract, bilateral: Secondary | ICD-10-CM | POA: Diagnosis not present

## 2015-03-01 DIAGNOSIS — E119 Type 2 diabetes mellitus without complications: Secondary | ICD-10-CM | POA: Diagnosis not present

## 2015-03-01 DIAGNOSIS — M7660 Achilles tendinitis, unspecified leg: Secondary | ICD-10-CM

## 2015-03-01 DIAGNOSIS — H02839 Dermatochalasis of unspecified eye, unspecified eyelid: Secondary | ICD-10-CM | POA: Diagnosis not present

## 2015-03-01 DIAGNOSIS — H4011X1 Primary open-angle glaucoma, mild stage: Secondary | ICD-10-CM | POA: Diagnosis not present

## 2015-03-01 NOTE — Progress Notes (Signed)
Pre visit review using our clinic review tool, if applicable. No additional management support is needed unless otherwise documented below in the visit note. 

## 2015-03-01 NOTE — Patient Instructions (Signed)
You will be contacted about your referral to sports medicine (Dr. Pearletha ForgeHudnall). You may apply ice twice daily as needed for pain and use tylenol as needed.   Achilles Tendinitis Achilles tendinitis is inflammation of the tough, cord-like band that attaches the lower muscles of your leg to your heel (Achilles tendon). It is usually caused by overusing the tendon and joint involved.  CAUSES Achilles tendinitis can happen because of:  A sudden increase in exercise or activity (such as running).  Doing the same exercises or activities (such as jumping) over and over.  Not warming up calf muscles before exercising.  Exercising in shoes that are worn out or not made for exercise.  Having arthritis or a bone growth on the back of the heel bone. This can rub against the tendon and hurt the tendon. SIGNS AND SYMPTOMS The most common symptoms are:  Pain in the back of the leg, just above the heel. The pain usually gets worse with exercise and better with rest.  Stiffness or soreness in the back of the leg, especially in the morning.  Swelling of the skin over the Achilles tendon.  Trouble standing on tiptoe. Sometimes, an Achilles tendon tears (ruptures). Symptoms of an Achilles tendon rupture can include:  Sudden, severe pain in the back of the leg.  Trouble putting weight on the foot or walking normally. DIAGNOSIS Achilles tendinitis will be diagnosed based on symptoms and a physical examination. An X-ray may be done to check if another condition is causing your symptoms. An MRI may be ordered if your health care provider suspects you may have completely torn your tendon, which is called an Achilles tendon rupture.  TREATMENT  Achilles tendinitis usually gets better over time. It can take weeks to months to heal completely. Treatment focuses on treating the symptoms and helping the injury heal. HOME CARE INSTRUCTIONS   Rest your Achilles tendon and avoid activities that cause  pain.  Apply ice to the injured area:  Put ice in a plastic bag.  Place a towel between your skin and the bag.  Leave the ice on for 20 minutes, 2-3 times a day  Try to avoid using the tendon (other than gentle range of motion) while the tendon is painful. Do not resume use until instructed by your health care provider. Then begin use gradually. Do not increase use to the point of pain. If pain does develop, decrease use and continue the above measures. Gradually increase activities that do not cause discomfort until you achieve normal use.  Do exercises to make your calf muscles stronger and more flexible. Your health care provider or physical therapist can recommend exercises for you to do.  Wrap your ankle with an elastic bandage or other wrap. This can help keep your tendon from moving too much. Your health care provider will show you how to wrap your ankle correctly.  Only take over-the-counter or prescription medicines for pain, discomfort, or fever as directed by your health care provider. SEEK MEDICAL CARE IF:   Your pain and swelling increase or pain is uncontrolled with medicines.  You develop new, unexplained symptoms or your symptoms get worse.  You are unable to move your toes or foot.  You develop warmth and swelling in your foot.  You have an unexplained temperature. MAKE SURE YOU:   Understand these instructions.  Will watch your condition.  Will get help right away if you are not doing well or get worse. Document Released: 05/14/2005 Document Revised: 05/25/2013  Document Reviewed: 03/16/2013 [facility] Patient Information 2015 [place], [place]. This information is not intended to replace advice given to you by your health care provider. Make sure you discuss any questions you have with your health care provider.

## 2015-03-01 NOTE — Progress Notes (Signed)
Subjective:    Patient ID: Brittany Robles, female    DOB: 09-04-1940, 74 y.o.   MRN: [id number]  HPI  Brittany Robles is a 74 yr old female who presents today with complaint of left foot pain. Pain has been present x 2 weeks. Pain is located in the left achilles.  Worse with dorsiflexion.     Review of Systems    see HPI  Past Medical History  Diagnosis Date  . Carpal tunnel syndrome, bilateral   . Hypertension   . Spinal stenosis of lumbar region   . Diabetes mellitus     Type 2  . Bowel obstruction   . 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 POLYCYSTIC KIDNEY DISEASE 10/11/2009  . Carotid artery stenosis 03/08/2012    Bilateral- 40-50% per duplex 7/13.  Needs follow up duplex in 1 year.   Marland Kitchen. PONV (postoperative nausea and vomiting)     History   Social History  . Marital Status: Divorced    Spouse Name: N/A  . Number of Children: 3  . Years of Education: N/A   Occupational History  . Retired    Social History Main Topics  . Smoking status: Former Smoker    Quit date: 04/18/2010  . Smokeless tobacco: Not on file  . Alcohol Use: Yes  . Drug Use: No  . Sexual Activity: Not on file   Other Topics Concern  . 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 Small intestine surgery      for bowel obstruction  . Appendectomy    . Spine surgery      lumbar spinal stenosis  . Cesarean section      x 2  . Carpal tunnel release    . Breast surgery      bx,   neg  . Anterior cervical decompression/discectomy fusion 4 levels N/A 03/15/2013    Procedure: Cervical Three-Four Cervical Four-Five Cervical Five-Six Cervical Six-Seven Anterior cervical decompression/diskectomy/fusion;  Surgeon: Karn CassisErnesto M Botero, MD;  Location: [facility];  Service: Neurosurgery;  Laterality: N/A;  Cervical Three-Four Cervical Four-Five Cervical  Five-Six Cervical Six-Seven Anterior cervical decompression/diskectomy/fusion    Family History  Problem Relation Age of Onset  . Cancer Other     Lung  . Cancer Mother     lung  . Cancer Sister     throat  . Cancer Brother     lung  . CAD Son 48    stent    Allergies  Allergen Reactions  . Cozaar [Losartan Potassium]     hyperkalemia  . Ibuprofen Other (See Comments)    Strange feelings and dreams  . Lisinopril Other (See Comments)    Light headed  . Medrol [Methylprednisolone]     Near syncope, low BP    Current Outpatient Prescriptions on File Prior to Visit  Medication Sig Dispense Refill  . acetaminophen (TYLENOL) 325 MG tablet Take 650 mg by mouth every 6 (six) hours as needed for pain or fever.     Marland Kitchen. amLODipine (NORVASC) 10 MG tablet Take 1 tablet (10 mg total) by mouth daily. 90 tablet 1  . aspirin EC 81 MG tablet Take 81 mg by mouth daily.    . Calcium Carbonate-Vitamin D (CALTRATE 600+D) 600-400 MG-UNIT per tablet Take 1 tablet by  mouth daily.    . fluconazole (DIFLUCAN) 50 MG tablet Take 1 tablet (50 mg total) by mouth daily. 7 tablet 0  . nystatin (MYCOSTATIN/NYSTOP) 100000 UNIT/GM POWD Apply 1 g topically 2 (two) times daily as needed (for rash). 30 g 0  . ONE TOUCH ULTRA TEST test strip Use to check blood sugar once a day.    . sodium polystyrene (KAYEXALATE) 15 GM/60ML suspension 30 grams by mouth once now.  Further instructions per provider. 500 mL 0  . ULTRA-THIN II MINI PEN NEEDLE 31G X 5 MM MISC Use to check blood sugar once a day    . valACYclovir (VALTREX) 1000 MG tablet Take 1 tablet (1,000 mg total) by mouth daily. 90 tablet 1   No current facility-administered medications on file prior to visit.    BP 122/80 mmHg  Pulse 78  Temp(Src) 98.1 F (36.7 C) (Oral)  Resp 16  Ht  (1.499 m)  Wt 133 lb 3.2 oz (60.419 kg)  BMI 26.89 kg/m2  SpO2 99%  LMP 08/19/1983    Objective:   Physical Exam  Constitutional: She is oriented to person,  place, and time. She appears well-developed and well-nourished. No distress.  Musculoskeletal:  L achilles tendon tender to palpation  Neurological: She is alert and oriented to person, place, and time.  Psychiatric: She has a normal mood and affect. Her behavior is normal. Thought content normal.          Assessment & Plan:  Achilles tendon pain- refer to sports med, advise tylenol (avoid nsaids due to polycystic kidney), ice bid.

## 2015-03-07 ENCOUNTER — Ambulatory Visit (INDEPENDENT_AMBULATORY_CARE_PROVIDER_SITE_OTHER): Payer: Medicare Other | Admitting: Family

## 2015-03-07 ENCOUNTER — Encounter: Payer: Self-pay | Admitting: Family

## 2015-03-07 ENCOUNTER — Ambulatory Visit (INDEPENDENT_AMBULATORY_CARE_PROVIDER_SITE_OTHER): Payer: Medicare Other | Admitting: Family Medicine

## 2015-03-07 ENCOUNTER — Encounter: Payer: Self-pay | Admitting: Family Medicine

## 2015-03-07 VITALS — BP 122/78 | HR 68 | Temp 98.1°F | Resp 16 | Ht 59.0 in | Wt 132.8 lb

## 2015-03-07 VITALS — BP 143/85 | HR 84 | Ht 59.0 in | Wt 133.0 lb

## 2015-03-07 DIAGNOSIS — E785 Hyperlipidemia, unspecified: Secondary | ICD-10-CM | POA: Diagnosis not present

## 2015-03-07 DIAGNOSIS — E1310 Other specified diabetes mellitus with ketoacidosis without coma: Secondary | ICD-10-CM | POA: Diagnosis not present

## 2015-03-07 DIAGNOSIS — M7662 Achilles tendinitis, left leg: Secondary | ICD-10-CM

## 2015-03-07 DIAGNOSIS — I10 Essential (primary) hypertension: Secondary | ICD-10-CM | POA: Diagnosis not present

## 2015-03-07 DIAGNOSIS — E1110 Type 2 diabetes mellitus with ketoacidosis without coma: Secondary | ICD-10-CM

## 2015-03-07 DIAGNOSIS — E119 Type 2 diabetes mellitus without complications: Secondary | ICD-10-CM

## 2015-03-07 DIAGNOSIS — M7660 Achilles tendinitis, unspecified leg: Secondary | ICD-10-CM

## 2015-03-07 LAB — BASIC METABOLIC PANEL
BUN: 28 mg/dL — AB (ref 6–23)
CHLORIDE: 104 meq/L (ref 96–112)
CO2: 28 mEq/L (ref 19–32)
CREATININE: 1.17 mg/dL (ref 0.40–1.20)
Calcium: 9.5 mg/dL (ref 8.4–10.5)
GFR: 58.22 mL/min — ABNORMAL LOW (ref >60.00)
Glucose, Bld: 113 mg/dL — ABNORMAL HIGH (ref 70–99)
POTASSIUM: 4.4 meq/L (ref 3.5–5.1)
Sodium: 138 mEq/L (ref 135–145)

## 2015-03-07 LAB — LIPID PANEL
CHOL/HDL RATIO: 3
Cholesterol: 193 mg/dL (ref 0–200)
HDL: 69.7 mg/dL (ref >39.00)
LDL Cholesterol: 109 mg/dL — ABNORMAL HIGH (ref 0–99)
NONHDL: 123.3
TRIGLYCERIDES: 73 mg/dL (ref 0.0–149.0)
VLDL: 14.6 mg/dL (ref 0.0–40.0)

## 2015-03-07 LAB — HEMOGLOBIN A1C: Hgb A1c MFr Bld: 6.2 % (ref 4.6–6.5)

## 2015-03-07 LAB — MICROALBUMIN / CREATININE URINE RATIO
Creatinine,U: 201.5 mg/dL
MICROALB/CREAT RATIO: 1.1 mg/g (ref 0.0–30.0)
Microalb, Ur: 2.2 mg/dL — ABNORMAL HIGH (ref 0.0–1.9)

## 2015-03-07 NOTE — Assessment & Plan Note (Signed)
BP stable on amlodipine, continue same.  

## 2015-03-07 NOTE — Assessment & Plan Note (Signed)
Obtain follow up lipid panel.  

## 2015-03-07 NOTE — Progress Notes (Signed)
Pre visit review using our clinic review tool, if applicable. No additional management support is needed unless otherwise documented below in the visit note. 

## 2015-03-07 NOTE — Patient Instructions (Signed)
Please complete lab work prior to leaving. Follow up in 4 months.  

## 2015-03-07 NOTE — Progress Notes (Signed)
Subjective:    Patient ID: Brittany Robles, female    DOB: 05/27/1941, 74 y.o.   MRN: [id number]  HPI  Brittany Robles is a 74 yr old female who presents today for follow up.  Patient presents today for follow up of multiple medical problems.  Diabetes Type 2  Pt is currently maintained on the following medications for diabetes: none, diet only  Lab Results  Component Value Date   HGBA1C 6.7* 09/06/2014   HGBA1C 6.6* 05/12/2014   HGBA1C 6.4* 10/24/2013    Lab Results  Component Value Date   MICROALBUR 0.87 02/02/2013   LDLCALC 94 11/04/2013   CREATININE 1.23* 09/06/2014    Last diabetic eye exam was  Lab Results  Component Value Date   HMDIABEYEEXA No Retinopathy 11/01/2014  Home glucose readings range: reports sugars <100  Hypertension  Patient is currently maintained on the following medications for blood pressure: amlodipine Patient reports good compliance with blood pressure medications. Patient denies chest pain, shortness of breath or swelling. 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 Saw Dr. Pearletha Forge this AM and was given some exercises for home.   Review of Systems    see HPI  Past Medical History  Diagnosis Date  . Carpal tunnel syndrome, bilateral   . Hypertension   . Spinal stenosis of lumbar region   . Diabetes mellitus     Type 2  . Bowel obstruction   . 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 POLYCYSTIC KIDNEY DISEASE 10/11/2009  . Carotid artery stenosis 03/08/2012    Bilateral- 40-50% per duplex 7/13.  Needs follow up duplex in 1 year.   Marland Kitchen PONV (postoperative nausea and vomiting)     History   Social History  . Marital Status: Divorced    Spouse Name: N/A  . Number of Children: 3  . Years of Education: N/A   Occupational History  . Retired    Social History Main Topics  .  Smoking status: Former Smoker    Quit date: 04/18/2010  . Smokeless tobacco: Not on file  . Alcohol Use: 0.0 oz/week    0 Standard drinks or equivalent per week  . Drug Use: No  . Sexual Activity: Not on file   Other Topics Concern  . 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 Small intestine surgery      for bowel obstruction  . Appendectomy    . Spine surgery      lumbar spinal stenosis  . Cesarean section      x 2  . Carpal tunnel release    . Breast surgery      bx,   neg  . Anterior cervical decompression/discectomy fusion 4 levels N/A 03/15/2013    Procedure: Cervical Three-Four Cervical Four-Five Cervical Five-Six Cervical Six-Seven Anterior cervical decompression/diskectomy/fusion;  Surgeon: Karn Cassis, MD;  Location: [facility];  Service: Neurosurgery;  Laterality: N/A;  Cervical Three-Four Cervical Four-Five Cervical Five-Six Cervical Six-Seven Anterior cervical decompression/diskectomy/fusion    Family History  Problem Relation Age of Onset  . Cancer Other     Lung  . Cancer Mother     lung  .  Cancer Sister     throat  . Cancer Brother     lung  . CAD Son 48    stent    Allergies  Allergen Reactions  . Cozaar [Losartan Potassium]     hyperkalemia  . Ibuprofen Other (See Comments)    Strange feelings and dreams  . Lisinopril Other (See Comments)    Light headed  . Medrol [Methylprednisolone]     Near syncope, low BP    Current Outpatient Prescriptions on File Prior to Visit  Medication Sig Dispense Refill  . acetaminophen (TYLENOL) 325 MG tablet Take 650 mg by mouth every 6 (six) hours as needed for pain or fever.     Marland Kitchen. amLODipine (NORVASC) 10 MG tablet Take 1 tablet (10 mg total) by mouth daily. 90 tablet 1  . aspirin EC 81 MG tablet Take 81 mg by mouth daily.    . Calcium  Carbonate-Vitamin D (CALTRATE 600+D) 600-400 MG-UNIT per tablet Take 1 tablet by mouth daily.    . fluconazole (DIFLUCAN) 50 MG tablet Take 1 tablet (50 mg total) by mouth daily. 7 tablet 0  . nystatin (MYCOSTATIN/NYSTOP) 100000 UNIT/GM POWD Apply 1 g topically 2 (two) times daily as needed (for rash). 30 g 0  . ONE TOUCH ULTRA TEST test strip Use to check blood sugar once a day.    . sodium polystyrene (KAYEXALATE) 15 GM/60ML suspension 30 grams by mouth once now.  Further instructions per provider. 500 mL 0  . ULTRA-THIN II MINI PEN NEEDLE 31G X 5 MM MISC Use to check blood sugar once a day    . valACYclovir (VALTREX) 1000 MG tablet Take 1 tablet (1,000 mg total) by mouth daily. 90 tablet 1   No current facility-administered medications on file prior to visit.    BP 122/78 mmHg  Pulse 68  Temp(Src) 98.1 F (36.7 C) (Oral)  Resp 16  Ht 4\' 11"  (1.499 m)  Wt 132 lb 12.8 oz (60.238 kg)  BMI 26.81 kg/m2  SpO2 99%  LMP 08/19/1983    Objective:   Physical Exam  Constitutional: She is oriented to person, place, and time. She appears well-developed and well-nourished.  HENT:  Head: Normocephalic and atraumatic.  Cardiovascular: Normal rate, regular rhythm and normal heart sounds.   No murmur heard. Pulmonary/Chest: Effort normal and breath sounds normal. No respiratory distress. She has no wheezes.  Musculoskeletal: She exhibits no edema.  Neurological: She is alert and oriented to person, place, and time.  Skin: Skin is warm and dry.  Psychiatric: She has a normal mood and affect. Her behavior is normal. Judgment and thought content normal.          Assessment & Plan:  Achilles Tendinopathy- improving, management per Sports medicine.

## 2015-03-07 NOTE — Assessment & Plan Note (Signed)
Clinically stable, obtain bmet, a1c, urine microalbumin.

## 2015-03-07 NOTE — Patient Instructions (Signed)
You have Achilles Tendinopathy Calf raises 3 sets of 10 on level ground once a day first. When these are easy, can do them one legged 3 sets of 10. Finally advance to doing them on a step. Ice bucket 10-15 minutes at end of day - can ice 3-4 times a day. Avoid uneven ground, hills as much as possible. Heel lifts in shoes or shoes with a natural heel lift. Consider physical therapy, orthotics, nitro patches if not improving as expected. Follow up in 6 weeks or as needed.

## 2015-03-09 ENCOUNTER — Encounter: Payer: Self-pay | Admitting: Family

## 2015-03-09 DIAGNOSIS — M7662 Achilles tendinitis, left leg: Secondary | ICD-10-CM | POA: Insufficient documentation

## 2015-03-09 NOTE — Assessment & Plan Note (Signed)
shown home exercises to do daily.  Icing, heel lifts.  Avoid uneven ground, hills as much as possible.  Consider physical therapy, orthotics, nitro patches if not improving.  F/u in 6 weeks or prn.

## 2015-03-09 NOTE — Progress Notes (Signed)
PCP and referred by: Lemont Fillers., NP  Subjective:   HPI: Patient is a 74 y.o. female here for left achilles pain.  Patient reports about 2 weeks ago she started getting pain in left achilles area. Felt like it was pulling a little. Has been icing, resting and some improvement over this time. Some pain with walking. Pain level 5/10. No prior issues.  Past Medical History  Diagnosis Date  . Carpal tunnel syndrome, bilateral   . Hypertension   . Spinal stenosis of lumbar region   . Diabetes mellitus     Type 2  . Bowel obstruction   . 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 POLYCYSTIC KIDNEY DISEASE 10/11/2009  . Carotid artery stenosis 03/08/2012    Bilateral- 40-50% per duplex 7/13.  Needs follow up duplex in 1 year.   Marland Kitchen PONV (postoperative nausea and vomiting)     Current Outpatient Prescriptions on File Prior to Visit  Medication Sig Dispense Refill  . acetaminophen (TYLENOL) 325 MG tablet Take 650 mg by mouth every 6 (six) hours as needed for pain or fever.     Marland Kitchen amLODipine (NORVASC) 10 MG tablet Take 1 tablet (10 mg total) by mouth daily. 90 tablet 1  . aspirin EC 81 MG tablet Take 81 mg by mouth daily.    . Calcium Carbonate-Vitamin D (CALTRATE 600+D) 600-400 MG-UNIT per tablet Take 1 tablet by mouth daily.    Marland Kitchen nystatin (MYCOSTATIN/NYSTOP) 100000 UNIT/GM POWD Apply 1 g topically 2 (two) times daily as needed (for rash). 30 g 0  . ONE TOUCH ULTRA TEST test strip Use to check blood sugar once a day.    . sodium polystyrene (KAYEXALATE) 15 GM/60ML suspension 30 grams by mouth once now.  Further instructions per provider. 500 mL 0  . ULTRA-THIN II MINI PEN NEEDLE 31G X 5 MM MISC Use to check blood sugar once a day    . valACYclovir (VALTREX) 1000 MG tablet Take 1 tablet (1,000 mg total) by mouth daily. 90 tablet 1   No current facility-administered medications on file prior to visit.    Past Surgical History  Procedure Laterality Date  . Small intestine surgery       for bowel obstruction  . Appendectomy    . Spine surgery      lumbar spinal stenosis  . Cesarean section      x 2  . Carpal tunnel release    . Breast surgery      bx,   neg  . Anterior cervical decompression/discectomy fusion 4 levels N/A 03/15/2013    Procedure: Cervical Three-Four Cervical Four-Five Cervical Five-Six Cervical Six-Seven Anterior cervical decompression/diskectomy/fusion;  Surgeon: Karn Cassis, MD;  Location: [facility];  Service: Neurosurgery;  Laterality: N/A;  Cervical Three-Four Cervical Four-Five Cervical Five-Six Cervical Six-Seven Anterior cervical decompression/diskectomy/fusion    Allergies  Allergen Reactions  . Cozaar [Losartan Potassium]     hyperkalemia  . Ibuprofen Other (See Comments)    Strange feelings and dreams  . Lisinopril Other (See Comments)    Light headed  . Medrol [Methylprednisolone]     Near syncope, low BP    History   Social History  . Marital Status: Divorced    Spouse Name: N/A  . Number of Children: 3  . Years of Education: N/A   Occupational History  . Retired    Social History Main Topics  . Smoking status: Former Smoker    Quit date: 04/18/2010  . Smokeless tobacco: Not on file  .  Alcohol Use: 0.0 oz/week    0 Standard drinks or equivalent per week  . Drug Use: No  . Sexual Activity: Not on file   Other Topics Concern  . 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 Cancer Other     Lung  . Cancer Mother     lung  . Cancer Sister     throat  . Cancer Brother     lung  . CAD Son 48    stent    BP 143/85 mmHg  Pulse 84  Ht  (1.499 m)  Wt 133 lb (60.328 kg)  BMI 26.85 kg/m2  LMP 08/19/1983  Review of Systems: See HPI above.    Objective:  Physical Exam:  Gen: NAD  Left foot/ankle: No gross deformity, swelling,  ecchymoses FROM TTP achilles near insertion on calcaneus. Negative ant drawer and talar tilt.   Negative syndesmotic compression. Thompsons test negative. NV intact distally.    Assessment & Plan:  1. Left achilles tendinopathy - shown home exercises to do daily.  Icing, heel lifts.  Avoid uneven ground, hills as much as possible.  Consider physical therapy, orthotics, nitro patches if not improving.  F/u in 6 weeks or prn.

## 2015-03-15 ENCOUNTER — Encounter: Payer: Self-pay | Admitting: Family Medicine

## 2015-03-22 ENCOUNTER — Other Ambulatory Visit: Payer: Self-pay | Admitting: Family

## 2015-03-25 ENCOUNTER — Other Ambulatory Visit: Payer: Self-pay | Admitting: Family

## 2015-03-28 ENCOUNTER — Encounter: Payer: Self-pay | Admitting: Family

## 2015-03-28 ENCOUNTER — Telehealth: Payer: Self-pay | Admitting: Family

## 2015-03-28 NOTE — Telephone Encounter (Signed)
See medical clearance letter. Could you please fax to [phone] ATTN:  Surgery Department?  thanks

## 2015-03-29 NOTE — Telephone Encounter (Signed)
Faxed successfully. [email or web address]

## 2015-04-09 ENCOUNTER — Telehealth: Payer: Self-pay | Admitting: Family

## 2015-04-09 NOTE — Telephone Encounter (Signed)
Pt left VM 04/09/15 2:53pm stating need to reschedule appt, left msg for pt to call back to reschedule. Only appt showing 07/09/15.

## 2015-04-10 DIAGNOSIS — H2512 Age-related nuclear cataract, left eye: Secondary | ICD-10-CM | POA: Diagnosis not present

## 2015-05-03 DIAGNOSIS — H2511 Age-related nuclear cataract, right eye: Secondary | ICD-10-CM | POA: Diagnosis not present

## 2015-05-03 DIAGNOSIS — H25011 Cortical age-related cataract, right eye: Secondary | ICD-10-CM | POA: Diagnosis not present

## 2015-05-18 ENCOUNTER — Encounter: Payer: Self-pay | Admitting: Family

## 2015-05-18 ENCOUNTER — Ambulatory Visit (INDEPENDENT_AMBULATORY_CARE_PROVIDER_SITE_OTHER): Payer: Medicare Other | Admitting: Family

## 2015-05-18 VITALS — BP 125/75 | HR 74 | Temp 98.6°F | Resp 16 | Ht 59.0 in | Wt 131.4 lb

## 2015-05-18 DIAGNOSIS — Z23 Encounter for immunization: Secondary | ICD-10-CM

## 2015-05-18 DIAGNOSIS — G4700 Insomnia, unspecified: Secondary | ICD-10-CM | POA: Diagnosis not present

## 2015-05-18 DIAGNOSIS — L304 Erythema intertrigo: Secondary | ICD-10-CM | POA: Diagnosis not present

## 2015-05-18 MED ORDER — AMLODIPINE BESYLATE 10 MG PO TABS
10.0000 mg | ORAL_TABLET | Freq: Every day | ORAL | Status: DC
Start: 2015-05-18 — End: 2015-05-18

## 2015-05-18 MED ORDER — AMLODIPINE BESYLATE 10 MG PO TABS: 10.0000 mg | ORAL_TABLET | Freq: Every day | ORAL | Status: DC

## 2015-05-18 MED ORDER — VALACYCLOVIR HCL 1 G PO TABS: 1000.0000 mg | ORAL_TABLET | Freq: Every day | ORAL | Status: DC

## 2015-05-18 MED ORDER — NYSTATIN 100000 UNIT/GM EX OINT: 1.0000 "application " | TOPICAL_OINTMENT | Freq: Two times a day (BID) | CUTANEOUS | Status: DC

## 2015-05-18 MED ORDER — FLUCONAZOLE 150 MG PO TABS: ORAL_TABLET | ORAL | Status: DC

## 2015-05-18 NOTE — Progress Notes (Signed)
Subjective:    Patient ID: Brittany Robles   DOB: Aug 20, 1940, 74 y.o.   MRN: [id number]  HPI  Brittany Robles is a 74 yr old female who presents today with chief complaint of rash. Rash is located beneath both breasts. Reports that nystatin powder is not working.   Insomnia- reports that she has had som family stress.  Notes that she falls asleep but has trouble staying asleep.  Reports that Brittany Robles and his family are pending eviction from their home and she is very worried about this.  Reports that she has been helping them financially and this has been a burden.    Review of Systems    see HPI  Past Medical History  Diagnosis Date  . Carpal tunnel syndrome, bilateral   . Hypertension   . Spinal stenosis of lumbar region   . Diabetes mellitus     Type 2  . Bowel obstruction   . 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 POLYCYSTIC KIDNEY DISEASE 10/11/2009  . Carotid artery stenosis 03/08/2012    Bilateral- 40-50% per duplex 7/13.  Needs follow up duplex in 1 year.   Marland Kitchen PONV (postoperative nausea and vomiting)     Social History   Social History  . Marital Status: Divorced    Spouse Name: N/A  . Number of Children: 3  . Years of Education: N/A   Occupational History  . Retired    Social History Main Topics  . Smoking status: Former Smoker    Quit date: 04/18/2010  . Smokeless tobacco: Not on file  . Alcohol Use: 0.0 oz/week    0 Standard drinks or equivalent per week  . Drug Use: No  . Sexual Activity: Not on file   Other Topics Concern  . 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 Small intestine surgery      for bowel obstruction  . Appendectomy    . Spine surgery      lumbar spinal stenosis  . Cesarean section      x 2  . Carpal tunnel release    . Breast surgery      bx,   neg  . Anterior  cervical decompression/discectomy fusion 4 levels N/A 03/15/2013    Procedure: Cervical Three-Four Cervical Four-Five Cervical Five-Six Cervical Six-Seven Anterior cervical decompression/diskectomy/fusion;  Surgeon: Karn Cassis, MD;  Location: [facility];  Service: Neurosurgery;  Laterality: N/A;  Cervical Three-Four Cervical Four-Five Cervical Five-Six Cervical Six-Seven Anterior cervical decompression/diskectomy/fusion    Family History  Problem Relation Age of Onset  . Cancer Other     Lung  . Cancer Mother     lung  . Cancer Sister     throat  . Cancer Brother     lung  . CAD Robles 48    stent    Allergies  Allergen Reactions  . Cozaar [Losartan Potassium]     hyperkalemia  . Ibuprofen Other (See Comments)    Strange feelings and dreams  . Lisinopril Other (See Comments)    Light headed  . Medrol [Methylprednisolone]     Near syncope, low BP    Current Outpatient Prescriptions on File Prior to Visit  Medication Sig Dispense Refill  . acetaminophen (TYLENOL) 325 MG tablet Take 650 mg by  mouth every 6 (six) hours as needed for pain or fever.     Marland Kitchen amLODipine (NORVASC) 10 MG tablet Take 1 tablet (10 mg total) by mouth daily. 90 tablet 1  . aspirin EC 81 MG tablet Take 81 mg by mouth daily.    . Calcium Carbonate-Vitamin D (CALTRATE 600+D) 600-400 MG-UNIT per tablet Take 1 tablet by mouth daily.    Marland Kitchen nystatin (MYCOSTATIN) powder APPLY ONE GRAM OF POWDER TOPICALLY TWICE DAILY AS NEEDED FOR RASH 30 g 0  . ONE TOUCH ULTRA TEST test strip Use to check blood sugar once a day.    . sodium polystyrene (KAYEXALATE) 15 GM/60ML suspension 30 grams by mouth once now.  Further instructions per provider. 500 mL 0  . ULTRA-THIN II MINI PEN NEEDLE 31G X 5 MM MISC Use to check blood sugar once a day    . valACYclovir (VALTREX) 1000 MG tablet Take 1 tablet (1,000 mg total) by mouth daily. 90 tablet 1   No current facility-administered medications on file prior to visit.    BP 125/75  mmHg  Pulse 74  Temp(Src) 98.6 F (37 C) (Oral)  Resp 16  Ht  (1.499 m)  Wt 131 lb 6.4 oz (59.603 kg)  BMI 26.53 kg/m2  SpO2 100%  LMP 08/19/1983    Objective:   Physical Exam  Constitutional: She appears well-developed and well-nourished. No distress.  Cardiovascular: Normal rate and regular rhythm.   No murmur heard. Pulmonary/Chest: Effort normal and breath sounds normal. No respiratory distress. She has no wheezes. She has no rales. She exhibits no tenderness.  Skin:  Fungal rash noted beneath breasts and between breasts.  Mild associated erythema between breasts, some peeling of skin between breasts.  Psychiatric:  Briefly tearful          Assessment & Plan:

## 2015-05-18 NOTE — Progress Notes (Signed)
Pre visit review using our clinic review tool, if applicable. No additional management support is needed unless otherwise documented below in the visit note. 

## 2015-05-18 NOTE — Assessment & Plan Note (Signed)
Related to situational stress.  We discussed med for insomnia and/or referral to counselor, she declines a this time.

## 2015-05-18 NOTE — Patient Instructions (Signed)
Start diflucan tab (one today and one tab in 1 week). Start nystatin ointment twice daily to rash.  Call if symptoms worsen or if symptoms are not improved in 2 weeks.

## 2015-05-18 NOTE — Assessment & Plan Note (Signed)
Uncontrolled by nystatin powder. Will change to nystatin ointment and also rx with diflucan.

## 2015-07-09 ENCOUNTER — Ambulatory Visit (INDEPENDENT_AMBULATORY_CARE_PROVIDER_SITE_OTHER): Payer: Medicare Other | Admitting: Family

## 2015-07-09 ENCOUNTER — Telehealth: Payer: Self-pay | Admitting: Family

## 2015-07-09 ENCOUNTER — Encounter: Payer: Self-pay | Admitting: Family

## 2015-07-09 VITALS — BP 140/80 | HR 75 | Temp 98.4°F | Resp 16 | Ht 59.0 in | Wt 133.8 lb

## 2015-07-09 DIAGNOSIS — E1310 Other specified diabetes mellitus with ketoacidosis without coma: Secondary | ICD-10-CM

## 2015-07-09 DIAGNOSIS — E785 Hyperlipidemia, unspecified: Secondary | ICD-10-CM | POA: Diagnosis not present

## 2015-07-09 DIAGNOSIS — I10 Essential (primary) hypertension: Secondary | ICD-10-CM | POA: Diagnosis not present

## 2015-07-09 DIAGNOSIS — E1110 Type 2 diabetes mellitus with ketoacidosis without coma: Secondary | ICD-10-CM

## 2015-07-09 DIAGNOSIS — E875 Hyperkalemia: Secondary | ICD-10-CM

## 2015-07-09 DIAGNOSIS — E1129 Type 2 diabetes mellitus with other diabetic kidney complication: Secondary | ICD-10-CM

## 2015-07-09 LAB — LIPID PANEL
CHOL/HDL RATIO: 3
Cholesterol: 205 mg/dL — ABNORMAL HIGH (ref 0–200)
HDL: 67.1 mg/dL (ref >39.00)
LDL CALC: 126 mg/dL — AB (ref 0–99)
NONHDL: 138.26
Triglycerides: 61 mg/dL (ref 0.0–149.0)
VLDL: 12.2 mg/dL (ref 0.0–40.0)

## 2015-07-09 LAB — BASIC METABOLIC PANEL
BUN: 25 mg/dL — AB (ref 6–23)
CHLORIDE: 105 meq/L (ref 96–112)
CO2: 27 meq/L (ref 19–32)
CREATININE: 1.23 mg/dL — AB (ref 0.40–1.20)
Calcium: 9.8 mg/dL (ref 8.4–10.5)
GFR: 54.9 mL/min — ABNORMAL LOW (ref >60.00)
GLUCOSE: 122 mg/dL — AB (ref 70–99)
POTASSIUM: 5.6 meq/L — AB (ref 3.5–5.1)
Sodium: 139 mEq/L (ref 135–145)

## 2015-07-09 LAB — HEMOGLOBIN A1C: Hgb A1c MFr Bld: 6.4 % (ref 4.6–6.5)

## 2015-07-09 MED ORDER — AMLODIPINE BESYLATE 10 MG PO TABS: 10.0000 mg | ORAL_TABLET | Freq: Every day | ORAL | Status: DC

## 2015-07-09 MED ORDER — SODIUM POLYSTYRENE SULFONATE 15 GM/60ML PO SUSP: 15.0000 g | Freq: Once | ORAL | Status: DC

## 2015-07-09 NOTE — Assessment & Plan Note (Signed)
Bp stable on amlodipine.  Continue same.

## 2015-07-09 NOTE — Patient Instructions (Signed)
Please complete lab work prior to leaving.   

## 2015-07-09 NOTE — Assessment & Plan Note (Signed)
LDL above goal.  Declines statin. Continue low cholesterol diet.

## 2015-07-09 NOTE — Progress Notes (Signed)
Subjective:    Patient ID: Brittany Robles, female    DOB: 06/21/1941, 74 y.o.   MRN: [id number]  HPI  Brittany Robles is a 74 yr old female who presents today for follow up.  1) DM2- She is on diet alone.  Lab Results  Component Value Date   HGBA1C 6.2 03/07/2015   HGBA1C 6.7* 09/06/2014   HGBA1C 6.6* 05/12/2014   Lab Results  Component Value Date   MICROALBUR 2.2* 03/07/2015   LDLCALC 109* 03/07/2015   CREATININE 1.17 03/07/2015   2) HTN- Maintained on amlodipine.  BP Readings from Last 3 Encounters:  07/09/15 140/80  05/18/15 125/75  03/07/15 122/78   3) Hyperlipidemia- declines statin.   Lab Results  Component Value Date   CHOL 193 03/07/2015   HDL 69.70 03/07/2015   LDLCALC 109* 03/07/2015   TRIG 73.0 03/07/2015   CHOLHDL 3 03/07/2015    Review of Systems  Respiratory: Negative for shortness of breath.   Cardiovascular: Negative for chest pain.   Past Medical History  Diagnosis Date  . Carpal tunnel syndrome, bilateral   . Hypertension   . Spinal stenosis of lumbar region   . Diabetes mellitus     Type 2  . Bowel obstruction (HCC)   . 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 POLYCYSTIC KIDNEY DISEASE 10/11/2009  . Carotid artery stenosis 03/08/2012    Bilateral- 40-50% per duplex 7/13.  Needs follow up duplex in 1 year.   Marland Kitchen. PONV (postoperative nausea and vomiting)     Social History   Social History  . Marital Status: Divorced    Spouse Name: N/A  . Number of Children: 3  . Years of Education: N/A   Occupational History  . Retired    Social History Main Topics  . Smoking status: Former Smoker    Quit date: 04/18/2010  . Smokeless tobacco: Not on file  . Alcohol Use: 0.0 oz/week    0 Standard drinks or equivalent per week  . Drug Use: No  . Sexual Activity: Not on file   Other Topics Concern  . 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 Small intestine surgery      for bowel obstruction  . Appendectomy    . Spine surgery      lumbar spinal stenosis  . Cesarean section      x 2  . Carpal tunnel release    . Breast surgery      bx,   neg  . Anterior cervical decompression/discectomy fusion 4 levels N/A 03/15/2013    Procedure: Cervical Three-Four Cervical Four-Five Cervical Five-Six Cervical Six-Seven Anterior cervical decompression/diskectomy/fusion;  Surgeon: Karn CassisErnesto M Botero, MD;  Location: [facility];  Service: Neurosurgery;  Laterality: N/A;  Cervical Three-Four Cervical Four-Five Cervical Five-Six Cervical Six-Seven Anterior cervical decompression/diskectomy/fusion    Family History  Problem Relation Age of Onset  . Cancer Other     Lung  . Cancer Mother     lung  . Cancer Sister     throat  . Cancer Brother     lung  . CAD Son 48    stent    Allergies  Allergen Reactions  . Cozaar [Losartan Potassium]     hyperkalemia  . Ibuprofen Other (See Comments)    Strange  feelings and dreams  . Lisinopril Other (See Comments)    Light headed  . Medrol [Methylprednisolone]     Near syncope, low BP    Current Outpatient Prescriptions on File Prior to Visit  Medication Sig Dispense Refill  . acetaminophen (TYLENOL) 325 MG tablet Take 650 mg by mouth every 6 (six) hours as needed for pain or fever.     Marland Kitchen amLODipine (NORVASC) 10 MG tablet Take 1 tablet (10 mg total) by mouth daily. 30 tablet 0  . aspirin EC 81 MG tablet Take 81 mg by mouth daily.    . Calcium Carbonate-Vitamin D (CALTRATE 600+D) 600-400 MG-UNIT per tablet Take 1 tablet by mouth daily.    Marland Kitchen nystatin ointment (MYCOSTATIN) Apply 1 application topically 2 (two) times daily. 30 g 1  . ONE TOUCH ULTRA TEST test strip Use to check blood sugar once a day.    . sodium polystyrene (KAYEXALATE) 15 GM/60ML suspension 30 grams by mouth once now.  Further instructions per provider. 500 mL 0  .  ULTRA-THIN II MINI PEN NEEDLE 31G X 5 MM MISC Use to check blood sugar once a day    . valACYclovir (VALTREX) 1000 MG tablet Take 1 tablet (1,000 mg total) by mouth daily. 30 tablet 0   No current facility-administered medications on file prior to visit.    BP 140/80 mmHg  Pulse 75  Temp(Src) 98.4 F (36.9 C)  Resp 16  Ht  (1.499 m)  Wt 133 lb 12.8 oz (60.691 kg)  BMI 27.01 kg/m2  SpO2 100%  LMP 08/19/1983       Objective:   Physical Exam  Constitutional: She is oriented to person, place, and time. She appears well-developed and well-nourished.  HENT:  Head: Normocephalic and atraumatic.  Cardiovascular: Normal rate, regular rhythm and normal heart sounds.   No murmur heard. Pulmonary/Chest: Effort normal and breath sounds normal. No respiratory distress. She has no wheezes.  Musculoskeletal: She exhibits no edema.  Neurological: She is alert and oriented to person, place, and time.  Psychiatric: She has a normal mood and affect. Her behavior is normal. Judgment and thought content normal.          Assessment & Plan:

## 2015-07-09 NOTE — Progress Notes (Signed)
Pre visit review using our clinic review tool, if applicable. No additional management support is needed unless otherwise documented below in the visit note. 

## 2015-07-09 NOTE — Assessment & Plan Note (Signed)
Continue diabetic diet. Obtain bmet and A1C

## 2015-07-09 NOTE — Telephone Encounter (Signed)
Could you please place pt on lab schedule 11 AM on 11/23?

## 2015-07-10 NOTE — Telephone Encounter (Signed)
Pt called stating that pharmacy didn't have meds last night. She will pick up today once they are ready (hopefully before noon). She asked if she needs to keep lab appt for 07/11/15. Pt requesting call at [phone].

## 2015-07-10 NOTE — Telephone Encounter (Signed)
Notified pt ok to keep lab appt as scheduled. She states she may not be able to make it to the lab before 12pm tomorrow and advised her it will be ok to come around 1:15 or 1:30pm. Pt voices understanding.

## 2015-07-10 NOTE — Telephone Encounter (Signed)
Lab appt scheduled.

## 2015-07-10 NOTE — Telephone Encounter (Signed)
Received call from pharmacist wanting to verify Kayexalate dose and quantity. Per verbal from PCP, should read 15g by mouth once. Pharmacist notified.

## 2015-07-11 ENCOUNTER — Other Ambulatory Visit (INDEPENDENT_AMBULATORY_CARE_PROVIDER_SITE_OTHER): Payer: Medicare Other

## 2015-07-11 DIAGNOSIS — E875 Hyperkalemia: Secondary | ICD-10-CM

## 2015-07-11 LAB — BASIC METABOLIC PANEL
BUN: 23 mg/dL (ref 6–23)
CHLORIDE: 105 meq/L (ref 96–112)
CO2: 28 meq/L (ref 19–32)
CREATININE: 1.05 mg/dL (ref 0.40–1.20)
Calcium: 9.2 mg/dL (ref 8.4–10.5)
GFR: 65.9 mL/min (ref >60.00)
Glucose, Bld: 99 mg/dL (ref 70–99)
POTASSIUM: 4.2 meq/L (ref 3.5–5.1)
Sodium: 142 mEq/L (ref 135–145)

## 2015-07-17 ENCOUNTER — Telehealth: Payer: Self-pay | Admitting: Family

## 2015-07-17 DIAGNOSIS — E875 Hyperkalemia: Secondary | ICD-10-CM

## 2015-07-17 NOTE — Telephone Encounter (Signed)
Notified pt per lab note and scheduled lab appt for 07/31/15 at 11am. Lab order entered.

## 2015-07-17 NOTE — Telephone Encounter (Signed)
Relation to NF:AOZHpt:self Call back number: [phone]    Reason for call:  Patient inquiring about lab results

## 2015-07-31 ENCOUNTER — Other Ambulatory Visit (INDEPENDENT_AMBULATORY_CARE_PROVIDER_SITE_OTHER): Payer: Medicare Other

## 2015-07-31 ENCOUNTER — Encounter: Payer: Self-pay | Admitting: Family

## 2015-07-31 DIAGNOSIS — E875 Hyperkalemia: Secondary | ICD-10-CM | POA: Diagnosis not present

## 2015-07-31 LAB — BASIC METABOLIC PANEL
BUN: 30 mg/dL — AB (ref 6–23)
CALCIUM: 9.1 mg/dL (ref 8.4–10.5)
CO2: 27 mEq/L (ref 19–32)
Chloride: 105 mEq/L (ref 96–112)
Creatinine, Ser: 1.33 mg/dL — ABNORMAL HIGH (ref 0.40–1.20)
GFR: 50.16 mL/min — AB (ref >60.00)
GLUCOSE: 167 mg/dL — AB (ref 70–99)
Potassium: 4.4 mEq/L (ref 3.5–5.1)
Sodium: 138 mEq/L (ref 135–145)

## 2015-08-01 DIAGNOSIS — H40022 Open angle with borderline findings, high risk, left eye: Secondary | ICD-10-CM | POA: Diagnosis not present

## 2015-08-01 DIAGNOSIS — Z961 Presence of intraocular lens: Secondary | ICD-10-CM | POA: Diagnosis not present

## 2015-08-01 DIAGNOSIS — H401111 Primary open-angle glaucoma, right eye, mild stage: Secondary | ICD-10-CM | POA: Diagnosis not present

## 2015-11-29 DIAGNOSIS — H401111 Primary open-angle glaucoma, right eye, mild stage: Secondary | ICD-10-CM | POA: Diagnosis not present

## 2015-11-29 DIAGNOSIS — H25011 Cortical age-related cataract, right eye: Secondary | ICD-10-CM | POA: Diagnosis not present

## 2015-11-29 DIAGNOSIS — H40052 Ocular hypertension, left eye: Secondary | ICD-10-CM | POA: Diagnosis not present

## 2015-11-29 DIAGNOSIS — H401121 Primary open-angle glaucoma, left eye, mild stage: Secondary | ICD-10-CM | POA: Diagnosis not present

## 2015-12-07 ENCOUNTER — Encounter: Payer: Self-pay | Admitting: Family

## 2015-12-07 ENCOUNTER — Ambulatory Visit (INDEPENDENT_AMBULATORY_CARE_PROVIDER_SITE_OTHER): Payer: Medicare Other | Admitting: Family

## 2015-12-07 VITALS — Temp 98.7°F | Resp 18 | Ht 59.0 in

## 2015-12-07 DIAGNOSIS — I10 Essential (primary) hypertension: Secondary | ICD-10-CM | POA: Diagnosis not present

## 2015-12-07 DIAGNOSIS — Q613 Polycystic kidney, unspecified: Secondary | ICD-10-CM

## 2015-12-07 DIAGNOSIS — R208 Other disturbances of skin sensation: Secondary | ICD-10-CM | POA: Diagnosis not present

## 2015-12-07 DIAGNOSIS — E1122 Type 2 diabetes mellitus with diabetic chronic kidney disease: Secondary | ICD-10-CM

## 2015-12-07 DIAGNOSIS — R200 Anesthesia of skin: Secondary | ICD-10-CM

## 2015-12-07 LAB — HEMOGLOBIN A1C: Hgb A1c MFr Bld: 6.5 % (ref 4.6–6.5)

## 2015-12-07 LAB — BASIC METABOLIC PANEL
BUN: 24 mg/dL — AB (ref 6–23)
CALCIUM: 10 mg/dL (ref 8.4–10.5)
CO2: 28 meq/L (ref 19–32)
CREATININE: 1.33 mg/dL — AB (ref 0.40–1.20)
Chloride: 104 mEq/L (ref 96–112)
GFR: 50.11 mL/min — ABNORMAL LOW (ref >60.00)
Glucose, Bld: 128 mg/dL — ABNORMAL HIGH (ref 70–99)
Potassium: 6 mEq/L — ABNORMAL HIGH (ref 3.5–5.1)
Sodium: 140 mEq/L (ref 135–145)

## 2015-12-07 MED ORDER — AMLODIPINE BESYLATE 10 MG PO TABS: 10.0000 mg | ORAL_TABLET | Freq: Every day | ORAL | Status: DC

## 2015-12-07 NOTE — Assessment & Plan Note (Signed)
Obtain follow up Creatinine. Reinforced importance of avoiding NSAIDS.

## 2015-12-07 NOTE — Progress Notes (Signed)
Subjective:    Patient ID: Brittany Robles, female    DOB: 1941/06/24, 75 y.o.   MRN: [id number]  HPI  [name] is a 75 yr old female who presents today for follow up.  1) HTN-maintained on amlodipine 10mg .  BP Readings from Last 3 Encounters:  07/09/15 140/80  05/18/15 125/75  03/07/15 122/78   2) DM2- diet controlled. Reports that her sugars are generally in the low 100's.  Lab Results  Component Value Date   HGBA1C 6.4 07/09/2015   HGBA1C 6.2 03/07/2015   HGBA1C 6.7* 09/06/2014   Lab Results  Component Value Date   MICROALBUR 2.2* 03/07/2015   LDLCALC 126* 07/09/2015   CREATININE 1.33* 07/31/2015   3) Hyperlipidemia- not currently on statin. Continues to decline statin. Lab Results  Component Value Date   CHOL 205* 07/09/2015   HDL 67.10 07/09/2015   LDLCALC 126* 07/09/2015   TRIG 61.0 07/09/2015   CHOLHDL 3 07/09/2015   4) Polycystic Kidney disease-  Lab Results  Component Value Date   CREATININE 1.33* 07/31/2015   Reports that her hand numbness never improved following her c-spine surgery.   Review of Systems See HPI  Past Medical History  Diagnosis Date  . Carpal tunnel syndrome, bilateral   . Hypertension   . Spinal stenosis of lumbar region   . Diabetes mellitus     Type 2  . Bowel obstruction (HCC)   . 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 POLYCYSTIC KIDNEY DISEASE 10/11/2009  . Carotid artery stenosis 03/08/2012    Bilateral- 40-50% per duplex 7/13.  Needs follow up duplex in 1 year.   Marland Kitchen. PONV (postoperative nausea and vomiting)      Social History   Social History  . Marital Status: Divorced    Spouse Name: N/A  . Number of Children: 3  . Years of Education: N/A   Occupational History  . Retired    Social History Main Topics  . Smoking status: Former Smoker    Quit date: 04/18/2010  . Smokeless tobacco: Not on file  . Alcohol Use: 0.0 oz/week    0 Standard drinks or equivalent per week  . Drug Use: No  . Sexual Activity: Not  on file   Other Topics Concern  . 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 Small intestine surgery      for bowel obstruction  . Appendectomy    . Spine surgery      lumbar spinal stenosis  . Cesarean section      x 2  . Carpal tunnel release    . Breast surgery      bx,   neg  . Anterior cervical decompression/discectomy fusion 4 levels N/A 03/15/2013    Procedure: Cervical Three-Four Cervical Four-Five Cervical Five-Six Cervical Six-Seven Anterior cervical decompression/diskectomy/fusion;  Surgeon: Karn CassisErnesto M Botero, MD;  Location: [facility];  Service: Neurosurgery;  Laterality: N/A;  Cervical Three-Four Cervical Four-Five Cervical Five-Six Cervical Six-Seven Anterior cervical decompression/diskectomy/fusion    Family History  Problem Relation Age of Onset  . Cancer Other     Lung  . Cancer Mother     lung  . Cancer Sister     throat  . Cancer Brother     lung  . CAD Son 7948  stent    Allergies  Allergen Reactions  . Cozaar [Losartan Potassium]     hyperkalemia  . Ibuprofen Other (See Comments)    Strange feelings and dreams  . Lisinopril Other (See Comments)    Light headed  . Medrol [Methylprednisolone]     Near syncope, low BP    Current Outpatient Prescriptions on File Prior to Visit  Medication Sig Dispense Refill  . acetaminophen (TYLENOL) 325 MG tablet Take 650 mg by mouth every 6 (six) hours as needed for pain or fever.     Marland Kitchen amLODipine (NORVASC) 10 MG tablet Take 1 tablet (10 mg total) by mouth daily. 30 tablet 5  . aspirin EC 81 MG tablet Take 81 mg by mouth daily.    . brimonidine (ALPHAGAN) 0.15 % ophthalmic solution Use 2 drops in left eye daily    . Calcium Carbonate-Vitamin D (CALTRATE 600+D) 600-400 MG-UNIT per tablet Take 1 tablet by mouth daily.    Marland Kitchen nystatin ointment (MYCOSTATIN)  Apply 1 application topically 2 (two) times daily. 30 g 1  . ONE TOUCH ULTRA TEST test strip Use to check blood sugar once a day.    Marland Kitchen ULTRA-THIN II MINI PEN NEEDLE 31G X 5 MM MISC Use to check blood sugar once a day    . valACYclovir (VALTREX) 1000 MG tablet Take 1 tablet (1,000 mg total) by mouth daily. (Patient taking differently: Take 1,000 mg by mouth daily as needed. ) 30 tablet 0   No current facility-administered medications on file prior to visit.    Temp(Src) 98.7 F (37.1 C) (Oral)  Resp 18  Ht  (1.499 m)  LMP 08/19/1983       Objective:   Physical Exam  Constitutional: She is oriented to person, place, and time. She appears well-developed and well-nourished.  HENT:  Head: Normocephalic and atraumatic.  Cardiovascular: Normal rate, regular rhythm and normal heart sounds.   No murmur heard. Pulmonary/Chest: Effort normal and breath sounds normal. No respiratory distress. She has no wheezes.  Musculoskeletal: She exhibits no edema.  Neurological: She is alert and oriented to person, place, and time.  Psychiatric: She has a normal mood and affect. Her behavior is normal. Judgment and thought content normal.          Assessment & Plan:

## 2015-12-07 NOTE — Assessment & Plan Note (Signed)
Unchanged post-operatively. Pt reports that neurosurgeon told her that he has nothing further to offer her.

## 2015-12-07 NOTE — Progress Notes (Signed)
Pre visit review using our clinic review tool, if applicable. No additional management support is needed unless otherwise documented below in the visit note. 

## 2015-12-07 NOTE — Assessment & Plan Note (Signed)
Stable, diet controlled, obtain a1c.

## 2015-12-07 NOTE — Patient Instructions (Addendum)
Please complete lab work prior to leaving. Continue to avoid any anti-inflammatory medications such as ibuprofen/aleve due to your kidneys. Please schedule a medicare wellness visit at the front desk.

## 2015-12-07 NOTE — Assessment & Plan Note (Signed)
Fair BP today. Monitor on current meds.

## 2015-12-09 ENCOUNTER — Telehealth: Payer: Self-pay | Admitting: Family

## 2015-12-09 DIAGNOSIS — E875 Hyperkalemia: Secondary | ICD-10-CM

## 2015-12-09 MED ORDER — SODIUM POLYSTYRENE SULFONATE 15 GM/60ML PO SUSP: 15.0000 g | Freq: Once | ORAL | Status: DC

## 2015-12-09 NOTE — Telephone Encounter (Signed)
Reviewed potassium. K+ elevated at 6.  Needs kayexalate x 1, and referral back to nephrology.  Left message on home phone to call office first thing tomorrow.  Pt should take kayexalate dose today.  Repeat bmet on Tuesday.  Dx hyperkalemia.  Needs follow up with kidney doctor.  I will arrange follow up.

## 2015-12-10 NOTE — Telephone Encounter (Signed)
Notified pt and she voices understanding and is agreeable to visit with nephrology. Lab appt scheduled for 12/11/15 at 1:30pm and future order entered.

## 2015-12-10 NOTE — Telephone Encounter (Signed)
Can be reached: [phone]   Reason for call: Pt returning call. Please call today.

## 2015-12-11 ENCOUNTER — Other Ambulatory Visit (INDEPENDENT_AMBULATORY_CARE_PROVIDER_SITE_OTHER): Payer: Medicare Other

## 2015-12-11 DIAGNOSIS — E875 Hyperkalemia: Secondary | ICD-10-CM | POA: Diagnosis not present

## 2015-12-11 LAB — BASIC METABOLIC PANEL
BUN: 28 mg/dL — AB (ref 6–23)
CALCIUM: 9.8 mg/dL (ref 8.4–10.5)
CO2: 26 meq/L (ref 19–32)
CREATININE: 1.33 mg/dL — AB (ref 0.40–1.20)
Chloride: 104 mEq/L (ref 96–112)
GFR: 50.11 mL/min — ABNORMAL LOW (ref >60.00)
Glucose, Bld: 153 mg/dL — ABNORMAL HIGH (ref 70–99)
Potassium: 5.3 mEq/L — ABNORMAL HIGH (ref 3.5–5.1)
Sodium: 141 mEq/L (ref 135–145)

## 2015-12-12 ENCOUNTER — Telehealth: Payer: Self-pay | Admitting: Family

## 2015-12-12 DIAGNOSIS — E875 Hyperkalemia: Secondary | ICD-10-CM

## 2015-12-12 NOTE — Telephone Encounter (Signed)
Left message for pt to return my call.

## 2015-12-12 NOTE — Telephone Encounter (Signed)
K+ is improved but still high. Repeat Kayexalate dose today. Repeat bmet in 1 week, dx hyperkalemia. I would like to refer her back to nephrology for management of her potassium.

## 2015-12-13 NOTE — Telephone Encounter (Signed)
Left a message for call back.  

## 2015-12-14 ENCOUNTER — Other Ambulatory Visit: Payer: Self-pay

## 2015-12-14 DIAGNOSIS — E875 Hyperkalemia: Secondary | ICD-10-CM

## 2015-12-14 NOTE — Telephone Encounter (Signed)
Pt returned call.  Provider's recommendations discussed.  She stated understanding and agrees with plan.  Lab appt scheduled.  Future lab ordered.  She agrees to see nephrology.  Nephrology referral placed.

## 2015-12-21 ENCOUNTER — Other Ambulatory Visit (INDEPENDENT_AMBULATORY_CARE_PROVIDER_SITE_OTHER): Payer: Medicare Other

## 2015-12-21 DIAGNOSIS — E875 Hyperkalemia: Secondary | ICD-10-CM

## 2015-12-21 LAB — BASIC METABOLIC PANEL
BUN: 26 mg/dL — AB (ref 6–23)
CALCIUM: 9.5 mg/dL (ref 8.4–10.5)
CO2: 26 mEq/L (ref 19–32)
Chloride: 104 mEq/L (ref 96–112)
Creatinine, Ser: 1.29 mg/dL — ABNORMAL HIGH (ref 0.40–1.20)
GFR: 51.9 mL/min — AB (ref >60.00)
GLUCOSE: 197 mg/dL — AB (ref 70–99)
Potassium: 5.3 mEq/L — ABNORMAL HIGH (ref 3.5–5.1)
SODIUM: 139 meq/L (ref 135–145)

## 2015-12-24 ENCOUNTER — Telehealth: Payer: Self-pay | Admitting: Family

## 2015-12-24 ENCOUNTER — Other Ambulatory Visit: Payer: Self-pay | Admitting: Family

## 2015-12-24 DIAGNOSIS — E875 Hyperkalemia: Secondary | ICD-10-CM

## 2015-12-24 NOTE — Telephone Encounter (Signed)
Notified pt and scheduled lab appt for 01/07/16 at 2pm. Future order entered.

## 2015-12-24 NOTE — Telephone Encounter (Signed)
Potassium remains mildly elevated.  I would like her to avoid high potassium foods.  Important to follow through with nephrology consult.  Does she have nephrology appointment?  Repeat bmet in 2 weeks. Dx hyperkalemia.    High potassium content foods  Highest content (>25 meq/100 g) High content (>6.2 meq/100 g)   Vegetables   Spinach   Tomatoes   Broccoli   Winter squash   Beets   Carrots   Cauliflower   Potatoes   Fruits   Bananas  Dried figs Cantaloupe  Molasses Kiwis  Seaweed Oranges  Very high content (>12.5 meq/100 g) Mangos  Dried fruits (dates, prunes) Meats  Nuts Ground beef  Avocados Steak  Bran cereals Pork  Wheat germ Veal  Lima beans Randa LynnLamb

## 2016-01-02 DIAGNOSIS — I129 Hypertensive chronic kidney disease with stage 1 through stage 4 chronic kidney disease, or unspecified chronic kidney disease: Secondary | ICD-10-CM | POA: Diagnosis not present

## 2016-01-02 DIAGNOSIS — N184 Chronic kidney disease, stage 4 (severe): Secondary | ICD-10-CM | POA: Diagnosis not present

## 2016-01-02 DIAGNOSIS — Q619 Cystic kidney disease, unspecified: Secondary | ICD-10-CM | POA: Diagnosis not present

## 2016-01-02 DIAGNOSIS — E119 Type 2 diabetes mellitus without complications: Secondary | ICD-10-CM | POA: Diagnosis not present

## 2016-01-02 DIAGNOSIS — N183 Chronic kidney disease, stage 3 (moderate): Secondary | ICD-10-CM | POA: Diagnosis not present

## 2016-01-02 DIAGNOSIS — N281 Cyst of kidney, acquired: Secondary | ICD-10-CM | POA: Diagnosis not present

## 2016-01-02 DIAGNOSIS — E875 Hyperkalemia: Secondary | ICD-10-CM | POA: Diagnosis not present

## 2016-01-07 ENCOUNTER — Other Ambulatory Visit: Payer: Medicare Other

## 2016-02-04 ENCOUNTER — Encounter: Payer: Self-pay | Admitting: Family

## 2016-02-04 ENCOUNTER — Ambulatory Visit (INDEPENDENT_AMBULATORY_CARE_PROVIDER_SITE_OTHER): Payer: Medicare Other | Admitting: Family

## 2016-02-04 VITALS — BP 144/70 | HR 75 | Temp 98.6°F | Resp 18 | Ht 59.0 in | Wt 133.6 lb

## 2016-02-04 DIAGNOSIS — M5432 Sciatica, left side: Secondary | ICD-10-CM

## 2016-02-04 DIAGNOSIS — M19072 Primary osteoarthritis, left ankle and foot: Secondary | ICD-10-CM | POA: Diagnosis not present

## 2016-02-04 NOTE — Patient Instructions (Signed)
For your back/leg pain- we will arrange a follow up visit with Dr. Jeral FruitBotero. You may use tylenol as needed. Left ankle pain is likely due to arthritis.  Please call if you develop pain, redness or increased swelling.

## 2016-02-04 NOTE — Progress Notes (Signed)
Subjective:    Patient ID: Brittany Robles, female    DOB: Aug 02, 1941, 75 y.o.   MRN: [id number]  HPI  Brittany Robles is a 75 yr old female who presents today for follow up.  1) Edema-  Reports + swelling/cramping in the left foot for a few days. Denies pain in left ankle.  Denies injury, reports swelling is worst at night, and better in the AM.    2) Back pain- Reports low back pain- radiates down the left leg.  The patient has known hx of lumbar spinal stenosis. She has hx of lumbar surgery and anterior cervical decompression.   Review of Systems    see HPI  Past Medical History  Diagnosis Date  . Carpal tunnel syndrome, bilateral   . Hypertension   . Spinal stenosis of lumbar region   . Diabetes mellitus     Type 2  . Bowel obstruction (HCC)   . 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 POLYCYSTIC KIDNEY DISEASE 10/11/2009  . Carotid artery stenosis 03/08/2012    Bilateral- 40-50% per duplex 7/13.  Needs follow up duplex in 1 year.   Marland Kitchen. PONV (postoperative nausea and vomiting)      Social History   Social History  . Marital Status: Divorced    Spouse Name: N/A  . Number of Children: 3  . Years of Education: N/A   Occupational History  . Retired    Social History Main Topics  . Smoking status: Former Smoker    Quit date: 04/18/2010  . Smokeless tobacco: Not on file  . Alcohol Use: 0.0 oz/week    0 Standard drinks or equivalent per week  . Drug Use: No  . Sexual Activity: Not on file   Other Topics Concern  . 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 Small intestine surgery      for bowel obstruction  . Appendectomy    . Spine surgery      lumbar spinal stenosis  . Cesarean section      x 2  . Carpal tunnel release    . Breast surgery      bx,   neg  . Anterior cervical decompression/discectomy fusion 4  levels N/A 03/15/2013    Procedure: Cervical Three-Four Cervical Four-Five Cervical Five-Six Cervical Six-Seven Anterior cervical decompression/diskectomy/fusion;  Surgeon: Karn CassisErnesto M Botero, MD;  Location: [facility];  Service: Neurosurgery;  Laterality: N/A;  Cervical Three-Four Cervical Four-Five Cervical Five-Six Cervical Six-Seven Anterior cervical decompression/diskectomy/fusion    Family History  Problem Relation Age of Onset  . Cancer Other     Lung  . Cancer Mother     lung  . Cancer Sister     throat  . Cancer Brother     lung  . CAD Son 48    stent    Allergies  Allergen Reactions  . Cozaar [Losartan Potassium]     hyperkalemia  . Ibuprofen Other (See Comments)    Strange feelings and dreams  . Lisinopril Other (See Comments)    Light headed  . Medrol [Methylprednisolone]     Near syncope, low BP    Current Outpatient Prescriptions on File Prior to Visit  Medication Sig Dispense Refill  . acetaminophen (TYLENOL) 325 MG tablet Take 650 mg by mouth every 6 (  six) hours as needed for pain or fever.     Marland Kitchen amLODipine (NORVASC) 10 MG tablet Take 1 tablet (10 mg total) by mouth daily. 90 tablet 1  . aspirin EC 81 MG tablet Take 81 mg by mouth daily.    . brimonidine (ALPHAGAN) 0.15 % ophthalmic solution Use 2 drops in left eye daily    . Calcium Carbonate-Vitamin D (CALTRATE 600+D) 600-400 MG-UNIT per tablet Take 1 tablet by mouth daily.    Marland Kitchen nystatin ointment (MYCOSTATIN) Apply 1 application topically 2 (two) times daily. 30 g 1  . ONE TOUCH ULTRA TEST test strip Use to check blood sugar once a day.    Marland Kitchen ULTRA-THIN II MINI PEN NEEDLE 31G X 5 MM MISC Use to check blood sugar once a day    . valACYclovir (VALTREX) 1000 MG tablet Take 1 tablet (1,000 mg total) by mouth daily. (Patient taking differently: Take 1,000 mg by mouth daily as needed. ) 30 tablet 0   No current facility-administered medications on file prior to visit.    BP 144/70 mmHg  Pulse 75  Temp(Src)  98.6 F (37 C) (Oral)  Resp 18  Ht  (1.499 m)  Wt 133 lb 9.6 oz (60.601 kg)  BMI 26.97 kg/m2  SpO2 99%  LMP 08/19/1983    Objective:   Physical Exam  Constitutional: She is oriented to person, place, and time. She appears well-developed and well-nourished.  HENT:  Head: Normocephalic and atraumatic.  Cardiovascular: Normal rate, regular rhythm and normal heart sounds.   No murmur heard. Pulmonary/Chest: Effort normal and breath sounds normal. No respiratory distress. She has no wheezes.  Musculoskeletal:  Mild swelling left medial ankle, no redness  Neurological: She is alert and oriented to person, place, and time.  Reflex Scores:      Patellar reflexes are 2+ on the right side and 2+ on the left side. Bilateral LE strength is 5/5  Psychiatric: She has a normal mood and affect. Brittany Robles behavior is normal. Judgment and thought content normal.          Assessment & Plan:  Sciatica-  Will refer back to Dr. Jeral Fruit Brittany Robles neurosurgeon for further evaluation. In the meantime, advised pt on prn tylenol.   Osteoarthritis-  Left ankle. Likely cause for swelling. Not causing Brittany Robles pain, monitor.

## 2016-02-04 NOTE — Progress Notes (Signed)
Pre visit review using our clinic review tool, if applicable. No additional management support is needed unless otherwise documented below in the visit note. 

## 2016-03-10 DIAGNOSIS — M5415 Radiculopathy, thoracolumbar region: Secondary | ICD-10-CM | POA: Diagnosis not present

## 2016-03-17 ENCOUNTER — Other Ambulatory Visit: Payer: Self-pay | Admitting: Neurosurgery

## 2016-03-17 DIAGNOSIS — M5415 Radiculopathy, thoracolumbar region: Secondary | ICD-10-CM

## 2016-03-21 ENCOUNTER — Ambulatory Visit
Admission: RE | Admit: 2016-03-21 | Discharge: 2016-03-21 | Disposition: A | Discharge status: Home / Self Care | Payer: Medicare Other | Source: Ambulatory Visit | Attending: Neurosurgery | Admitting: Neurosurgery

## 2016-03-21 ENCOUNTER — Other Ambulatory Visit: Payer: Medicare Other

## 2016-03-21 VITALS — BP 113/60 | HR 69

## 2016-03-21 DIAGNOSIS — M5030 Other cervical disc degeneration, unspecified cervical region: Secondary | ICD-10-CM

## 2016-03-21 DIAGNOSIS — M50321 Other cervical disc degeneration at C4-C5 level: Secondary | ICD-10-CM | POA: Diagnosis not present

## 2016-03-21 DIAGNOSIS — M5415 Radiculopathy, thoracolumbar region: Secondary | ICD-10-CM

## 2016-03-21 DIAGNOSIS — M5090 Cervical disc disorder, unspecified, unspecified cervical region: Secondary | ICD-10-CM

## 2016-03-21 DIAGNOSIS — M4806 Spinal stenosis, lumbar region: Secondary | ICD-10-CM | POA: Diagnosis not present

## 2016-03-21 DIAGNOSIS — R200 Anesthesia of skin: Secondary | ICD-10-CM

## 2016-03-21 MED ORDER — DIAZEPAM 5 MG PO TABS
5.0000 mg | ORAL_TABLET | Freq: Once | ORAL | Status: AC
  Administered 2016-03-21: 5 mg via ORAL

## 2016-03-21 MED ORDER — ONDANSETRON HCL 4 MG/2ML IJ SOLN
4.0000 mg | Freq: Once | INTRAMUSCULAR | Status: AC
  Administered 2016-03-21: 4 mg via INTRAMUSCULAR

## 2016-03-21 MED ORDER — IOPAMIDOL (ISOVUE-M 300) INJECTION 61%
10.0000 mL | Freq: Once | INTRAMUSCULAR | Status: AC | PRN
  Administered 2016-03-21: 10 mL via INTRATHECAL

## 2016-03-21 MED ORDER — MEPERIDINE HCL 100 MG/ML IJ SOLN
50.0000 mg | Freq: Once | INTRAMUSCULAR | Status: AC
  Administered 2016-03-21: 50 mg via INTRAMUSCULAR

## 2016-03-21 NOTE — Discharge Instructions (Signed)

## 2016-03-24 ENCOUNTER — Telehealth: Payer: Self-pay | Admitting: Radiology

## 2016-03-24 NOTE — Telephone Encounter (Signed)
Follow up to myelo last Friday. Pt doing well waiting for follow up at physician's office.

## 2016-03-31 ENCOUNTER — Ambulatory Visit (INDEPENDENT_AMBULATORY_CARE_PROVIDER_SITE_OTHER): Payer: Medicare Other | Admitting: Family

## 2016-03-31 ENCOUNTER — Encounter: Payer: Self-pay | Admitting: Family

## 2016-03-31 VITALS — BP 130/74 | HR 68 | Temp 98.2°F | Resp 16 | Ht 59.0 in | Wt 132.4 lb

## 2016-03-31 DIAGNOSIS — E785 Hyperlipidemia, unspecified: Secondary | ICD-10-CM

## 2016-03-31 DIAGNOSIS — E1122 Type 2 diabetes mellitus with diabetic chronic kidney disease: Secondary | ICD-10-CM

## 2016-03-31 DIAGNOSIS — I10 Essential (primary) hypertension: Secondary | ICD-10-CM | POA: Diagnosis not present

## 2016-03-31 DIAGNOSIS — Q613 Polycystic kidney, unspecified: Secondary | ICD-10-CM | POA: Diagnosis not present

## 2016-03-31 DIAGNOSIS — Z0000 Encounter for general adult medical examination without abnormal findings: Secondary | ICD-10-CM

## 2016-03-31 LAB — LIPID PANEL
Cholesterol: 217 mg/dL — ABNORMAL HIGH (ref 0–200)
HDL: 65.9 mg/dL (ref >39.00)
LDL CALC: 134 mg/dL — AB (ref 0–99)
NonHDL: 151.42
Total CHOL/HDL Ratio: 3
Triglycerides: 86 mg/dL (ref 0.0–149.0)
VLDL: 17.2 mg/dL (ref 0.0–40.0)

## 2016-03-31 LAB — BASIC METABOLIC PANEL
BUN: 32 mg/dL — AB (ref 6–23)
CALCIUM: 9.6 mg/dL (ref 8.4–10.5)
CO2: 27 meq/L (ref 19–32)
CREATININE: 1.28 mg/dL — AB (ref 0.40–1.20)
Chloride: 105 mEq/L (ref 96–112)
GFR: 52.33 mL/min — ABNORMAL LOW (ref >60.00)
GLUCOSE: 101 mg/dL — AB (ref 70–99)
Potassium: 5.2 mEq/L — ABNORMAL HIGH (ref 3.5–5.1)
Sodium: 139 mEq/L (ref 135–145)

## 2016-03-31 LAB — MICROALBUMIN / CREATININE URINE RATIO
Creatinine,U: 155.5 mg/dL
MICROALB UR: 1.4 mg/dL (ref 0.0–1.9)
Microalb Creat Ratio: 0.9 mg/g (ref 0.0–30.0)

## 2016-03-31 LAB — HEMOGLOBIN A1C: Hgb A1c MFr Bld: 6.5 % (ref 4.6–6.5)

## 2016-03-31 NOTE — Progress Notes (Signed)
Pre visit review using our clinic review tool, if applicable. No additional management support is needed unless otherwise documented below in the visit note. 

## 2016-03-31 NOTE — Progress Notes (Signed)
Subjective:    Patient ID: Brittany Robles, female    DOB: [birth year], 75 y.o.   MRN: [id number]  HPI  [name] is a 75 yr old female who presents today for follow up.  1) DM2- check sugars on occasion. 80-100 cbgs generally.   Lab Results  Component Value Date   HGBA1C 6.5 12/07/2015   HGBA1C 6.4 07/09/2015   HGBA1C 6.2 03/07/2015   Lab Results  Component Value Date   MICROALBUR 2.2 (H) 03/07/2015   LDLCALC 126 (H) 07/09/2015   CREATININE 1.29 (H) 12/21/2015   2) HTN- on amlodipine.   BP Readings from Last 3 Encounters:  03/31/16 130/74  03/21/16 113/60  02/04/16 (!) 144/70   3) Nail problem-  Notes some white discoloration of her fingernails.   Review of Systems    see HPI  Past Medical History:  Diagnosis Date  . Bowel obstruction (HCC)   . Carotid artery stenosis 03/08/2012   Bilateral- 40-50% per duplex 7/13.  Needs follow up duplex in 1 year.   . Carpal tunnel syndrome, bilateral   . Diabetes mellitus    Type 2  . Hypertension   . POLYCYSTIC KIDNEY DISEASE 10/11/2009  . PONV (postoperative nausea and vomiting)   . 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 Spinal stenosis of lumbar region      Social History   Social History  . Marital status: Divorced    Spouse name: N/A  . Number of children: 3  . Years of education: N/A   Occupational History  . Retired    Social History Main Topics  . Smoking status: Former Smoker    Quit date: 04/18/2010  . Smokeless tobacco: Not on file  . Alcohol use 0.0 oz/week  . Drug use: No  . Sexual activity: Not on file   Other Topics Concern  . 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 ANTERIOR CERVICAL DECOMPRESSION/DISCECTOMY FUSION 4 LEVELS N/A 03/15/2013   Procedure: Cervical Three-Four Cervical Four-Five Cervical Five-Six Cervical Six-Seven Anterior  cervical decompression/diskectomy/fusion;  Surgeon: Karn CassisErnesto M Botero, MD;  Location: [facility];  Service: Neurosurgery;  Laterality: N/A;  Cervical Three-Four Cervical Four-Five Cervical Five-Six Cervical Six-Seven Anterior cervical decompression/diskectomy/fusion  . APPENDECTOMY    . BREAST SURGERY     bx,   neg  . CARPAL TUNNEL RELEASE    . CESAREAN SECTION     x 2  . SMALL INTESTINE SURGERY     for bowel obstruction  . SPINE SURGERY     lumbar spinal stenosis    Family History  Problem Relation Age of Onset  . Cancer Mother     lung  . Cancer Sister     throat  . Cancer Brother     lung  . Cancer Other     Lung  . CAD Son 48    stent    Allergies  Allergen Reactions  . Cozaar [Losartan Potassium] Other (See Comments)    hyperkalemia  . Medrol [Methylprednisolone] Other (See Comments)    Near syncope, low BP  . Ibuprofen Other (See Comments)    Strange feelings and dreams  . Lisinopril Other (See Comments)    Current Outpatient Prescriptions on File Prior to Visit  Medication Sig Dispense Refill  . acetaminophen (TYLENOL) 325  MG tablet Take 650 mg by mouth every 6 (six) hours as needed for pain or fever.     Marland Kitchen. amLODipine (NORVASC) 10 MG tablet Take 1 tablet (10 mg total) by mouth daily. 90 tablet 1  . aspirin EC 81 MG tablet Take 81 mg by mouth daily.    . brimonidine (ALPHAGAN) 0.15 % ophthalmic solution Use 2 drops in left eye daily    . Calcium Carbonate-Vitamin D (CALTRATE 600+D) 600-400 MG-UNIT per tablet Take 1 tablet by mouth daily.    . cholecalciferol (VITAMIN D) 1000 units tablet Take 1,000 Units by mouth daily.    Marland Kitchen. nystatin ointment (MYCOSTATIN) Apply 1 application topically 2 (two) times daily. 30 g 1  . ONE TOUCH ULTRA TEST test strip Use to check blood sugar once a day.    Marland Kitchen. ULTRA-THIN II MINI PEN NEEDLE 31G X 5 MM MISC Use to check blood sugar once a day    . valACYclovir (VALTREX) 1000 MG tablet Take 1 tablet (1,000 mg total) by mouth daily.  (Patient taking differently: Take 1,000 mg by mouth daily as needed. ) 30 tablet 0   No current facility-administered medications on file prior to visit.     BP 130/74   Pulse 68   Temp 98.2 F (36.8 C) (Oral)   Resp 16   Ht 4\' 11"  (1.499 m)   Wt 132 lb 6.4 oz (60.1 kg)   LMP 08/19/1983   SpO2 98% Comment: room air  BMI 26.74 kg/m    Objective:   Physical Exam  Constitutional: She is oriented to person, place, and time. She appears well-developed and well-nourished.  HENT:  Head: Normocephalic and atraumatic.  Cardiovascular: Normal rate, regular rhythm and normal heart sounds.   No murmur heard. Pulmonary/Chest: Effort normal and breath sounds normal. No respiratory distress. She has no wheezes.  Musculoskeletal: She exhibits no edema.  Lymphadenopathy:    She has no cervical adenopathy.  Neurological: She is alert and oriented to person, place, and time.  Skin:  Some white discoloration of distal fingernails  Psychiatric: She has a normal mood and affect. Her behavior is normal. Judgment and thought content normal.          Assessment & Plan:  Nail trauma- has been doing "gel" nails.  Advised pt discoloration likely due to nail trauma.

## 2016-03-31 NOTE — Progress Notes (Signed)
Subjective:   Brittany Robles is a 75 y.o. female who presents for Medicare Annual (Subsequent) preventive examination.  Review of Systems:  No ROS.  Medicare Wellness Visit.  Cardiac Risk Factors include: advanced age (>10955men, 52>65 women);diabetes mellitus;dyslipidemia;hypertension  Sleep patterns: No sleep issues. Gets up once nightly to void.   Home Safety/Smoke Alarms: Lives w/ niece and family. Feels safe in home. Smoke detectors in home.   Living environment; residence and Firearm Safety: No firearms. Seat Belt Safety/Bike Helmet: Wears seat belt.   Counseling:   Eye Exam- Dr. Elmer PickerHecker yearly or as needed  Dental- Does not see dentist regularly. Brushes teeth twice daily.  Female:   Pap- N/A       Mammo- 10/31/14, BI-RADS CATEGORY  1: Negative.      Dexa scan- 08/18/06        CCS- 10/15/05, self-reported normal     Objective:     Vitals: BP 130/74   Pulse 68   Temp 98.2 F (36.8 C) (Oral)   Resp 16   Ht 4\' 11"  (1.499 m)   Wt 132 lb 6.4 oz (60.1 kg)   LMP 08/19/1983   SpO2 98% Comment: room air  BMI 26.74 kg/m   Body mass index is 26.74 kg/m.   Tobacco History  Smoking Status  . Former Smoker  . Quit date: 04/18/2010  Smokeless Tobacco  . Not on file     Counseling given: Not Answered   Past Medical History:  Diagnosis Date  . Bowel obstruction (HCC)   . Carotid artery stenosis 03/08/2012   Bilateral- 40-50% per duplex 7/13.  Needs follow up duplex in 1 year.   . Carpal tunnel syndrome, bilateral   . Diabetes mellitus    Type 2  . Hypertension   . POLYCYSTIC KIDNEY DISEASE 10/11/2009  . PONV (postoperative nausea and vomiting)   . 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 Spinal stenosis of lumbar region    Past Surgical History:  Procedure Laterality Date  . ANTERIOR CERVICAL DECOMPRESSION/DISCECTOMY FUSION 4 LEVELS N/A 03/15/2013   Procedure: Cervical Three-Four Cervical Four-Five Cervical Five-Six Cervical Six-Seven Anterior cervical  decompression/diskectomy/fusion;  Surgeon: Karn CassisErnesto M Botero, MD;  Location: [facility];  Service: Neurosurgery;  Laterality: N/A;  Cervical Three-Four Cervical Four-Five Cervical Five-Six Cervical Six-Seven Anterior cervical decompression/diskectomy/fusion  . APPENDECTOMY    . BREAST SURGERY     bx,   neg  . CARPAL TUNNEL RELEASE    . CESAREAN SECTION     x 2  . SMALL INTESTINE SURGERY     for bowel obstruction  . SPINE SURGERY     lumbar spinal stenosis   Family History  Problem Relation Age of Onset  . Cancer Mother     lung  . Cancer Sister     throat  . Cancer Brother     lung  . Cancer Other     Lung  . CAD Son [id number]    stent   History  Sexual Activity  . Sexual activity: Not on file    Outpatient Encounter Prescriptions as of 03/31/2016  Medication Sig  . acetaminophen (TYLENOL) 325 MG tablet Take 650 mg by mouth every 6 (six) hours as needed for pain or fever.   Marland Kitchen. amLODipine (NORVASC) 10 MG tablet Take 1 tablet (10 mg total) by mouth daily.  Marland Kitchen. aspirin EC 81 MG tablet Take 81 mg by mouth daily.  . brimonidine (ALPHAGAN) 0.15 % ophthalmic solution Use 2 drops in left eye  daily  . Calcium Carbonate-Vitamin D (CALTRATE 600+D) 600-400 MG-UNIT per tablet Take 1 tablet by mouth daily.  . cholecalciferol (VITAMIN D) 1000 units tablet Take 1,000 Units by mouth daily.  Marland Kitchen. nystatin ointment (MYCOSTATIN) Apply 1 application topically 2 (two) times daily.  . ONE TOUCH ULTRA TEST test strip Use to check blood sugar once a day.  Marland Kitchen. ULTRA-THIN II MINI PEN NEEDLE 31G X 5 MM MISC Use to check blood sugar once a day  . valACYclovir (VALTREX) 1000 MG tablet Take 1 tablet (1,000 mg total) by mouth daily. (Patient taking differently: Take 1,000 mg by mouth daily as needed. )   No facility-administered encounter medications on file as of 03/31/2016.     Activities of Daily Living In your present state of health, do you have any difficulty performing the following activities: 03/31/2016    Hearing? N  Vision? N  Difficulty concentrating or making decisions? N  Walking or climbing stairs? N  Dressing or bathing? N  Doing errands, shopping? N  Preparing Food and eating ? N  Using the Toilet? N  In the past six months, have you accidently leaked urine? N  Do you have problems with loss of bowel control? N  Managing your Medications? N  Managing your Finances? N  Housekeeping or managing your Housekeeping? N  Some recent data might be hidden    Patient Care Team: Sandford CrazeMelissa O'Sullivan, NP as PCP - General (Internal Medicine) Mateo FlowKathryn Hecker, MD as Consulting Physician (Ophthalmology) Barnabas ListerSameea Sadiq, MD as Consulting Physician (Nephrology)    Assessment:    Physical assessment deferred to PCP.  Exercise Activities and Dietary recommendations Current Exercise Habits: Home exercise routine, Type of exercise: walking, Frequency (Times/Week): 7, Exercise limited by: orthopedic condition(s) (chronic back pain)  Diet (meal preparation, eat out, water intake, caffeinated beverages, dairy products, fruits and vegetables): Fish and green vegetable/salad/broccoli and starch. Able to prepare meals. Reports she does not eat a lot. Drinks water a few bottles of water daily, has cut back on Pepsi intake. Eats 3 meals daily.     Goals    . Patient Stated (pt-stated)          Stay as healthy as I possibly can.      Fall Risk Fall Risk  03/31/2016 09/06/2014 05/06/2013  Falls in the past year? No No No   Depression Screen PHQ 2/9 Scores 03/31/2016 09/06/2014 05/06/2013  PHQ - 2 Score 0 0 0     Cognitive Testing MMSE - Mini Mental State Exam 03/31/2016  Orientation to time 5  Orientation to Place 5  Registration 3  Attention/ Calculation 4  Recall 3  Language- name 2 objects 2  Language- repeat 1  Language- follow 3 step command 3  Language- read & follow direction 1  Write a sentence 1  Copy design 1  Total score 29    Immunization History  Administered Date(s)  Administered  . Influenza Split 05/10/2012  . Influenza,inj,Quad PF,36+ Mos 05/06/2013, 05/18/2015  . Influenza-Unspecified 04/18/2014  . Pneumococcal Conjugate-13 08/01/2013  . Pneumococcal Polysaccharide-23 06/13/2008  . Td 01/01/2004, 09/06/2014  . Zoster 02/15/2014   Screening Tests Health Maintenance  Topic Date Due  . FOOT EXAM  09/07/2015  . URINE MICROALBUMIN  03/06/2016  . INFLUENZA VACCINE  03/18/2016  . COLONOSCOPY  06/07/2016 (Originally 10/16/2015)  . HEMOGLOBIN A1C  06/07/2016  . OPHTHALMOLOGY EXAM  10/16/2016  . MAMMOGRAM  10/30/2016  . TETANUS/TDAP  09/06/2024  . DEXA SCAN  Completed  .  ZOSTAVAX  Completed  . PNA vac Low Risk Adult  Completed   Diabetic Foot Exam - Simple   Simple Foot Form Visual Inspection No deformities, no ulcerations, no other skin breakdown bilaterally:  Yes Sensation Testing Intact to touch and monofilament testing bilaterally:  Yes Pulse Check Posterior Tibialis and Dorsalis pulse intact bilaterally:  Yes Comments       Plan:    Continue to eat heart healthy diet (full of fruits, vegetables, whole grains, lean protein, water--limit salt, fat, and sugar intake) and increase physical activity as tolerated.  Continue doing brain stimulating activities (puzzles, reading, adult coloring books, staying active) to keep memory sharp.   During the course of the visit the patient was educated and counseled about the following appropriate screening and preventive services:   Vaccines to include Pneumoccal, Influenza, Hepatitis B, Td, Zostavax, HCV  Cardiovascular Disease  Colorectal cancer screening  Bone density screening  Diabetes screening  Glaucoma screening  Mammography/PAP  Nutrition counseling   Patient Instructions (the written plan) was given to the patient.   Starla Link, RN  03/31/2016

## 2016-03-31 NOTE — Assessment & Plan Note (Signed)
BP stable.  Continue amlodipine. 

## 2016-03-31 NOTE — Assessment & Plan Note (Signed)
Obtain bmet 

## 2016-03-31 NOTE — Assessment & Plan Note (Signed)
Clinically stable.  Obtain A1C, continue diabetic diet.

## 2016-03-31 NOTE — Patient Instructions (Signed)
Continue to eat heart healthy diet (full of fruits, vegetables, whole grains, lean protein, water--limit salt, fat, and sugar intake) and increase physical activity as tolerated.  Continue doing brain stimulating activities (puzzles, reading, adult coloring books, staying active) to keep memory sharp.   Call your insurance company to determine your out of pocket cost of colonoscopy and bone density scan. Let us know when you are ready to schedule.

## 2016-04-01 ENCOUNTER — Telehealth: Payer: Self-pay | Admitting: Family

## 2016-04-01 MED ORDER — HYDROCHLOROTHIAZIDE 25 MG PO TABS: 25.0000 mg | ORAL_TABLET | Freq: Every day | ORAL | 3 refills | Status: DC

## 2016-04-01 MED ORDER — AMLODIPINE BESYLATE 10 MG PO TABS: 5.0000 mg | ORAL_TABLET | Freq: Every day | ORAL | 1 refills | Status: DC

## 2016-04-01 NOTE — Telephone Encounter (Signed)
Potassium is consistently mildly elevated.  I would like to have her cut amlodipine in half and take 1/2 tab once daily. Also, add hctz 25mg  once daily. This will help to control blood pressure but also keep her potassium down a bit. Needs follow up in 1-2 weeks (OK to schedule her when I get back from vacation on the 28th).

## 2016-04-01 NOTE — Telephone Encounter (Signed)
Patient informed, understood & agreed; new Rx for HCTZ to pharmacy; pt is out of town until Sept 3, 2017, scheduled F/U appointment for Tues, 04/22/16 at 10:45a/SLS 08/15

## 2016-04-01 NOTE — Progress Notes (Signed)
Noted and agree. 

## 2016-04-03 DIAGNOSIS — M79605 Pain in left leg: Secondary | ICD-10-CM | POA: Diagnosis not present

## 2016-04-03 DIAGNOSIS — M4806 Spinal stenosis, lumbar region: Secondary | ICD-10-CM | POA: Diagnosis not present

## 2016-04-03 DIAGNOSIS — M79604 Pain in right leg: Secondary | ICD-10-CM | POA: Diagnosis not present

## 2016-04-10 DIAGNOSIS — M4806 Spinal stenosis, lumbar region: Secondary | ICD-10-CM | POA: Diagnosis not present

## 2016-04-11 DIAGNOSIS — M9981 Other biomechanical lesions of cervical region: Secondary | ICD-10-CM | POA: Diagnosis not present

## 2016-04-11 DIAGNOSIS — R200 Anesthesia of skin: Secondary | ICD-10-CM | POA: Diagnosis not present

## 2016-04-11 DIAGNOSIS — R202 Paresthesia of skin: Secondary | ICD-10-CM | POA: Diagnosis not present

## 2016-04-22 ENCOUNTER — Encounter: Payer: Self-pay | Admitting: Internal Medicine

## 2016-04-22 ENCOUNTER — Encounter: Payer: Self-pay | Admitting: Family

## 2016-04-22 ENCOUNTER — Ambulatory Visit (INDEPENDENT_AMBULATORY_CARE_PROVIDER_SITE_OTHER): Payer: Medicare Other | Admitting: Family

## 2016-04-22 VITALS — BP 143/85 | HR 88 | Temp 98.8°F | Resp 16 | Ht <= 58 in | Wt 129.8 lb

## 2016-04-22 DIAGNOSIS — E2839 Other primary ovarian failure: Secondary | ICD-10-CM

## 2016-04-22 DIAGNOSIS — Z0000 Encounter for general adult medical examination without abnormal findings: Secondary | ICD-10-CM

## 2016-04-22 DIAGNOSIS — I10 Essential (primary) hypertension: Secondary | ICD-10-CM | POA: Diagnosis not present

## 2016-04-22 DIAGNOSIS — E1129 Type 2 diabetes mellitus with other diabetic kidney complication: Secondary | ICD-10-CM

## 2016-04-22 DIAGNOSIS — E785 Hyperlipidemia, unspecified: Secondary | ICD-10-CM | POA: Diagnosis not present

## 2016-04-22 LAB — BASIC METABOLIC PANEL
BUN: 29 mg/dL — AB (ref 6–23)
CHLORIDE: 100 meq/L (ref 96–112)
CO2: 30 meq/L (ref 19–32)
Calcium: 9.5 mg/dL (ref 8.4–10.5)
Creatinine, Ser: 1.41 mg/dL — ABNORMAL HIGH (ref 0.40–1.20)
GFR: 46.79 mL/min — AB (ref >60.00)
GLUCOSE: 130 mg/dL — AB (ref 70–99)
POTASSIUM: 4.8 meq/L (ref 3.5–5.1)
Sodium: 138 mEq/L (ref 135–145)

## 2016-04-22 NOTE — Assessment & Plan Note (Addendum)
bp is acceptable for her age. Continue current meds. Obtain follow up bmet.

## 2016-04-22 NOTE — Progress Notes (Signed)
Subjective:    Patient ID: Brittany Robles, female    DOB: [birth year], 75 y.o.   MRN: [id number]  HPI   Brittany Robles is a 75 yr old female who presents today for follow up.  1) HTN- last visit she reported some LE edema.  Amlodipine dose was decreased and hctz was added to her regimen.  BP Readings from Last 3 Encounters:  04/22/16 (!) 143/85  03/31/16 130/74  03/21/16 113/60   2) DM2- maintianed on diet alone.  Lab Results  Component Value Date   HGBA1C 6.5 03/31/2016   HGBA1C 6.5 12/07/2015   HGBA1C 6.4 07/09/2015   Lab Results  Component Value Date   MICROALBUR 1.4 03/31/2016   LDLCALC 134 (H) 03/31/2016   CREATININE 1.28 (H) 03/31/2016   3) Hyperlipidemia- not on statin.  Declines statin.  Lab Results  Component Value Date   CHOL 217 (H) 03/31/2016   HDL 65.90 03/31/2016   LDLCALC 134 (H) 03/31/2016   TRIG 86.0 03/31/2016   CHOLHDL 3 03/31/2016       Review of Systems    see HPI  Past Medical History:  Diagnosis Date  . Bowel obstruction (HCC)   . Carotid artery stenosis 03/08/2012   Bilateral- 40-50% per duplex 7/13.  Needs follow up duplex in 1 year.   . Carpal tunnel syndrome, bilateral   . Diabetes mellitus    Type 2  . Hypertension   . POLYCYSTIC KIDNEY DISEASE 10/11/2009  . PONV (postoperative nausea and vomiting)   . 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 Spinal stenosis of lumbar region      Social History   Social History  . Marital status: Divorced    Spouse name: N/A  . Number of children: 3  . Years of education: N/A   Occupational History  . Retired    Social History Main Topics  . Smoking status: Former Smoker    Quit date: 04/18/2010  . Smokeless tobacco: Not on file  . Alcohol use 0.0 oz/week  . Drug use: No  . Sexual activity: Not on file   Other Topics Concern  . 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 ANTERIOR CERVICAL DECOMPRESSION/DISCECTOMY FUSION 4 LEVELS N/A 03/15/2013   Procedure: Cervical Three-Four Cervical Four-Five Cervical Five-Six Cervical Six-Seven Anterior cervical decompression/diskectomy/fusion;  Surgeon: Karn Cassis, MD;  Location: [facility];  Service: Neurosurgery;  Laterality: N/A;  Cervical Three-Four Cervical Four-Five Cervical Five-Six Cervical Six-Seven Anterior cervical decompression/diskectomy/fusion  . APPENDECTOMY    . BREAST SURGERY     bx,   neg  . CARPAL TUNNEL RELEASE    . CESAREAN SECTION     x 2  . SMALL INTESTINE SURGERY     for bowel obstruction  . SPINE SURGERY     lumbar spinal stenosis    Family History  Problem Relation Age of Onset  . Cancer Mother     lung  . Cancer Sister     throat  . Cancer Brother     lung  . Cancer Other     Lung  . CAD Son 48    stent    Allergies  Allergen Reactions  . Cozaar [Losartan Potassium] Other (See Comments)    hyperkalemia  . Medrol [Methylprednisolone] Other (See Comments)  Near syncope, low BP  . Ibuprofen Other (See Comments)    Strange feelings and dreams  . Lisinopril Other (See Comments)    Current Outpatient Prescriptions on File Prior to Visit  Medication Sig Dispense Refill  . acetaminophen (TYLENOL) 325 MG tablet Take 650 mg by mouth every 6 (six) hours as needed for pain or fever.     Marland Kitchen. amLODipine (NORVASC) 10 MG tablet Take 0.5 tablets (5 mg total) by mouth daily. 90 tablet 1  . aspirin EC 81 MG tablet Take 81 mg by mouth daily.    . brimonidine (ALPHAGAN) 0.15 % ophthalmic solution Use 2 drops in left eye daily    . Calcium Carbonate-Vitamin D (CALTRATE 600+D) 600-400 MG-UNIT per tablet Take 1 tablet by mouth daily.    . cholecalciferol (VITAMIN D) 1000 units tablet Take 1,000 Units by mouth daily.    . hydrochlorothiazide (HYDRODIURIL) 25 MG tablet Take 1 tablet (25 mg total) by mouth daily. 30 tablet 3  . nystatin  ointment (MYCOSTATIN) Apply 1 application topically 2 (two) times daily. 30 g 1  . ONE TOUCH ULTRA TEST test strip Use to check blood sugar once a day.    Marland Kitchen. ULTRA-THIN II MINI PEN NEEDLE 31G X 5 MM MISC Use to check blood sugar once a day    . valACYclovir (VALTREX) 1000 MG tablet Take 1 tablet (1,000 mg total) by mouth daily. (Patient taking differently: Take 1,000 mg by mouth daily as needed. ) 30 tablet 0   No current facility-administered medications on file prior to visit.     BP (!) 143/85 (BP Location: Right Arm, Patient Position: Sitting, Cuff Size: Normal)   Pulse 88   Temp 98.8 F (37.1 C) (Oral)   Resp 16   Ht 4\' 9"  (1.448 m)   Wt 129 lb 12.8 oz (58.9 kg)   LMP 08/19/1983   SpO2 99%   BMI 28.09 kg/m    Objective:   Physical Exam  Constitutional: She is oriented to person, place, and time. She appears well-developed and well-nourished.  HENT:  Head: Normocephalic and atraumatic.  Cardiovascular: Normal rate, regular rhythm and normal heart sounds.   No murmur heard. Pulmonary/Chest: Effort normal and breath sounds normal. No respiratory distress. She has no wheezes.  Musculoskeletal: She exhibits no edema.  Neurological: She is alert and oriented to person, place, and time.  Skin: Skin is warm and dry.  Psychiatric: She has a normal mood and affect. Her behavior is normal. Judgment and thought content normal.          Assessment & Plan:  She is now ready to proceed with bone density and colonoscopy referral- will order.  Declines flu shot today.

## 2016-04-22 NOTE — Progress Notes (Signed)
Pre visit review using our clinic review tool, if applicable. No additional management support is needed unless otherwise documented below in the visit note. 

## 2016-04-22 NOTE — Patient Instructions (Signed)
Complete lab work prior to leaving.  You will be contacted about your referral for Bone density and colonoscopy. Continue current medications. Follow up as scheduled.

## 2016-04-22 NOTE — Assessment & Plan Note (Signed)
Stable on diet alone.  

## 2016-04-22 NOTE — Assessment & Plan Note (Signed)
Lipids above goal, declines statin therapy. Continue to work on diet.

## 2016-04-24 ENCOUNTER — Ambulatory Visit (HOSPITAL_BASED_OUTPATIENT_CLINIC_OR_DEPARTMENT_OTHER)
Admission: RE | Admit: 2016-04-24 | Discharge: 2016-04-24 | Disposition: A | Discharge status: Home / Self Care | Payer: Medicare Other | Source: Ambulatory Visit | Attending: Family | Admitting: Family

## 2016-04-24 ENCOUNTER — Encounter: Payer: Self-pay | Admitting: Family

## 2016-04-24 DIAGNOSIS — Z780 Asymptomatic menopausal state: Secondary | ICD-10-CM | POA: Diagnosis not present

## 2016-04-24 DIAGNOSIS — M85852 Other specified disorders of bone density and structure, left thigh: Secondary | ICD-10-CM | POA: Insufficient documentation

## 2016-04-24 DIAGNOSIS — Z13820 Encounter for screening for osteoporosis: Secondary | ICD-10-CM | POA: Insufficient documentation

## 2016-04-24 DIAGNOSIS — E2839 Other primary ovarian failure: Secondary | ICD-10-CM

## 2016-04-24 DIAGNOSIS — M8588 Other specified disorders of bone density and structure, other site: Secondary | ICD-10-CM | POA: Insufficient documentation

## 2016-04-24 DIAGNOSIS — M85862 Other specified disorders of bone density and structure, left lower leg: Secondary | ICD-10-CM | POA: Diagnosis not present

## 2016-04-27 ENCOUNTER — Encounter: Payer: Self-pay | Admitting: Family

## 2016-04-27 DIAGNOSIS — M8580 Other specified disorders of bone density and structure, unspecified site: Secondary | ICD-10-CM

## 2016-04-27 HISTORY — DX: Other specified disorders of bone density and structure, unspecified site: M85.80

## 2016-04-30 DIAGNOSIS — M79604 Pain in right leg: Secondary | ICD-10-CM | POA: Diagnosis not present

## 2016-04-30 DIAGNOSIS — M4806 Spinal stenosis, lumbar region: Secondary | ICD-10-CM | POA: Diagnosis not present

## 2016-05-02 ENCOUNTER — Encounter: Payer: Self-pay | Admitting: Family

## 2016-05-02 ENCOUNTER — Telehealth: Payer: Self-pay | Admitting: Family

## 2016-05-02 NOTE — Telephone Encounter (Signed)
Caller name: [facility]endy [facility]  Relation to pt: NP Call back number: [phone]       Reason for call:  NP inquiring about patient most recent A1C. Please advise

## 2016-05-05 ENCOUNTER — Telehealth: Payer: Self-pay | Admitting: Family

## 2016-05-05 DIAGNOSIS — Q613 Polycystic kidney, unspecified: Secondary | ICD-10-CM

## 2016-05-05 NOTE — Telephone Encounter (Signed)
Caller name: Relationship to patient: Self Can be reached: [phone] Pharmacy:  Reason for call: Request call back to discuss changing Nephrologist.

## 2016-05-05 NOTE — Telephone Encounter (Signed)
Spoke with pt. States her niece sees Dr Briant CedarMattingly ( & had kidney transplant?). Pt has seen Dr Elenore RotaSadiq and wanted to see how her creatinine levels are currently doing and wondered when she might need to see Dr Briant CedarMattingly?  Please advise.

## 2016-05-06 NOTE — Telephone Encounter (Signed)
Spoke with pt. Reviewed kidney function. She would like referral to Dr. Briant CedarMattingly. Will arrange.

## 2016-05-23 ENCOUNTER — Other Ambulatory Visit: Payer: Self-pay | Admitting: Family

## 2016-05-23 NOTE — Telephone Encounter (Signed)
[facility] to 1/2 tab once daily #45.

## 2016-06-20 ENCOUNTER — Encounter: Payer: Medicare Other | Admitting: Internal Medicine

## 2016-06-24 ENCOUNTER — Encounter: Payer: Self-pay | Admitting: Family

## 2016-06-26 DIAGNOSIS — E119 Type 2 diabetes mellitus without complications: Secondary | ICD-10-CM | POA: Diagnosis not present

## 2016-06-26 DIAGNOSIS — Z23 Encounter for immunization: Secondary | ICD-10-CM | POA: Diagnosis not present

## 2016-06-26 DIAGNOSIS — N183 Chronic kidney disease, stage 3 (moderate): Secondary | ICD-10-CM | POA: Diagnosis not present

## 2016-06-26 DIAGNOSIS — I129 Hypertensive chronic kidney disease with stage 1 through stage 4 chronic kidney disease, or unspecified chronic kidney disease: Secondary | ICD-10-CM | POA: Diagnosis not present

## 2016-08-01 ENCOUNTER — Encounter: Payer: Self-pay | Admitting: Family

## 2016-08-01 ENCOUNTER — Other Ambulatory Visit: Payer: Self-pay | Admitting: *Deleted

## 2016-08-01 ENCOUNTER — Ambulatory Visit (INDEPENDENT_AMBULATORY_CARE_PROVIDER_SITE_OTHER): Payer: Medicare Other | Admitting: Family

## 2016-08-01 VITALS — BP 144/77 | HR 70 | Temp 98.6°F | Resp 16 | Ht <= 58 in | Wt 129.2 lb

## 2016-08-01 DIAGNOSIS — N182 Chronic kidney disease, stage 2 (mild): Secondary | ICD-10-CM | POA: Diagnosis not present

## 2016-08-01 DIAGNOSIS — A6000 Herpesviral infection of urogenital system, unspecified: Secondary | ICD-10-CM | POA: Diagnosis not present

## 2016-08-01 DIAGNOSIS — H409 Unspecified glaucoma: Secondary | ICD-10-CM | POA: Insufficient documentation

## 2016-08-01 DIAGNOSIS — E785 Hyperlipidemia, unspecified: Secondary | ICD-10-CM

## 2016-08-01 DIAGNOSIS — I10 Essential (primary) hypertension: Secondary | ICD-10-CM

## 2016-08-01 DIAGNOSIS — E1122 Type 2 diabetes mellitus with diabetic chronic kidney disease: Secondary | ICD-10-CM

## 2016-08-01 LAB — BASIC METABOLIC PANEL
BUN: 29 mg/dL — AB (ref 6–23)
CHLORIDE: 100 meq/L (ref 96–112)
CO2: 33 mEq/L — ABNORMAL HIGH (ref 19–32)
Calcium: 9.6 mg/dL (ref 8.4–10.5)
Creatinine, Ser: 1.31 mg/dL — ABNORMAL HIGH (ref 0.40–1.20)
GFR: 50.9 mL/min — AB (ref >60.00)
Glucose, Bld: 122 mg/dL — ABNORMAL HIGH (ref 70–99)
POTASSIUM: 4.3 meq/L (ref 3.5–5.1)
SODIUM: 140 meq/L (ref 135–145)

## 2016-08-01 LAB — HEMOGLOBIN A1C: HEMOGLOBIN A1C: 6.9 % — AB (ref 4.6–6.5)

## 2016-08-01 MED ORDER — VALACYCLOVIR HCL 1 G PO TABS: 1000.0000 mg | ORAL_TABLET | Freq: Every day | ORAL | 1 refills | Status: DC | PRN

## 2016-08-01 MED ORDER — ONETOUCH ULTRA SYSTEM W/DEVICE KIT: PACK | 0 refills | Status: DC

## 2016-08-01 NOTE — Assessment & Plan Note (Signed)
LDL above goal, declines statin. Continue dietary efforts.

## 2016-08-01 NOTE — Patient Instructions (Signed)
Please complete lab work prior to leaving.   

## 2016-08-01 NOTE — Progress Notes (Signed)
Subjective:    Patient ID: Brittany Robles, female    DOB: May 18, 1941, 75 y.o.   MRN: [id number]  HPI   Brittany Robles is a 75 yr old female who presents today for follow up.  1) DM2- reports that her sugars are running 80-100.  Diet is good.   Lab Results  Component Value Date   HGBA1C 6.5 03/31/2016   HGBA1C 6.5 12/07/2015   HGBA1C 6.4 07/09/2015   Lab Results  Component Value Date   MICROALBUR 1.4 03/31/2016   LDLCALC 134 (H) 03/31/2016   CREATININE 1.41 (H) 04/22/2016   2) HTN- amlodipine hctz. Denies CP/SOB or swelling.  BP Readings from Last 3 Encounters:  08/01/16 (!) 144/77  04/22/16 (!) 143/85  03/31/16 130/74   3) Glaucoma- has follow up next week and continues eye drops.   4) HSV- no recent breakout, using valtrex prn.   5) Renal insufficiency/PCKD- recently saw nephrology- was told kidneys were stable   Review of Systems See HPI  Past Medical History:  Diagnosis Date  . Bowel obstruction   . Carotid artery stenosis 03/08/2012   Bilateral- 40-50% per duplex 7/13.  Needs follow up duplex in 1 year.   . Carpal tunnel syndrome, bilateral   . Diabetes mellitus    Type 2  . Hypertension   . Osteopenia 04/27/2016   -1.8 femur 9/17  . POLYCYSTIC KIDNEY DISEASE 10/11/2009  . PONV (postoperative nausea and vomiting)   . 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 Spinal stenosis of lumbar region      Social History   Social History  . Marital status: Divorced    Spouse name: N/A  . Number of children: 3  . Years of education: N/A   Occupational History  . Retired    Social History Main Topics  . Smoking status: Former Smoker    Quit date: 04/18/2010  . Smokeless tobacco: Not on file  . Alcohol use 0.0 oz/week  . Drug use: No  . Sexual activity: Not on file   Other Topics Concern  . 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 ANTERIOR CERVICAL DECOMPRESSION/DISCECTOMY FUSION 4 LEVELS N/A 03/15/2013   Procedure: Cervical Three-Four Cervical Four-Five Cervical Five-Six Cervical Six-Seven Anterior cervical decompression/diskectomy/fusion;  Surgeon: Karn CassisErnesto M Botero, MD;  Location: [facility];  Service: Neurosurgery;  Laterality: N/A;  Cervical Three-Four Cervical Four-Five Cervical Five-Six Cervical Six-Seven Anterior cervical decompression/diskectomy/fusion  . APPENDECTOMY    . BREAST SURGERY     bx,   neg  . CARPAL TUNNEL RELEASE    . CESAREAN SECTION     x 2  . SMALL INTESTINE SURGERY     for bowel obstruction  . SPINE SURGERY     lumbar spinal stenosis    Family History  Problem Relation Age of Onset  . Cancer Mother     lung  . Cancer Sister     throat  . Cancer Brother     lung  . Cancer Other     Lung  . CAD Son 48    stent    Allergies  Allergen Reactions  . Cozaar [Losartan Potassium] Other (See Comments)    hyperkalemia  . Medrol [Methylprednisolone] Other (See Comments)    Near syncope, low BP  . Ibuprofen  Other (See Comments)    Strange feelings and dreams  . Lisinopril Other (See Comments)    Current Outpatient Prescriptions on File Prior to Visit  Medication Sig Dispense Refill  . acetaminophen (TYLENOL) 325 MG tablet Take 650 mg by mouth every 6 (six) hours as needed for pain or fever.     Marland Kitchen. amLODipine (NORVASC) 10 MG tablet Take 0.5 tablets (5 mg total) by mouth daily. 90 tablet 1  . aspirin EC 81 MG tablet Take 81 mg by mouth daily.    . brimonidine (ALPHAGAN) 0.15 % ophthalmic solution Use 2 drops in left eye daily    . Calcium Carbonate-Vitamin D (CALTRATE 600+D) 600-400 MG-UNIT per tablet Take 1 tablet by mouth daily.    . cholecalciferol (VITAMIN D) 1000 units tablet Take 1,000 Units by mouth daily.    . hydrochlorothiazide (HYDRODIURIL) 25 MG tablet Take 1 tablet (25 mg total) by mouth daily. 30 tablet 3  . nystatin ointment (MYCOSTATIN)  Apply 1 application topically 2 (two) times daily. 30 g 1  . ONE TOUCH ULTRA TEST test strip Use to check blood sugar once a day.    Marland Kitchen. ULTRA-THIN II MINI PEN NEEDLE 31G X 5 MM MISC Use to check blood sugar once a day    . valACYclovir (VALTREX) 1000 MG tablet Take 1 tablet (1,000 mg total) by mouth daily. (Patient taking differently: Take 1,000 mg by mouth daily as needed. ) 30 tablet 0   No current facility-administered medications on file prior to visit.     BP (!) 144/77   Pulse 70   Temp 98.6 F (37 C) (Oral)   Resp 16   Ht 4\' 9"  (1.448 m)   Wt 129 lb 3.2 oz (58.6 kg)   LMP 08/19/1983   SpO2 100%   BMI 27.96 kg/m       Objective:   Physical Exam  Constitutional: She is oriented to person, place, and time. She appears well-developed and well-nourished.  HENT:  Head: Normocephalic and atraumatic.  Cardiovascular: Normal rate, regular rhythm and normal heart sounds.   No murmur heard. Pulmonary/Chest: Effort normal and breath sounds normal. No respiratory distress. She has no wheezes.  Musculoskeletal: She exhibits no edema.  Neurological: She is alert and oriented to person, place, and time.  Psychiatric: She has a normal mood and affect. Her behavior is normal. Judgment and thought content normal.          Assessment & Plan:

## 2016-08-01 NOTE — Assessment & Plan Note (Signed)
Clinically stable. Continue diabetic diet.

## 2016-08-01 NOTE — Assessment & Plan Note (Signed)
Pt has upcoming appointment with opthalmology- continue drops/management per opthalmology.

## 2016-08-01 NOTE — Assessment & Plan Note (Signed)
Stable with prn use of valtrex.  

## 2016-08-01 NOTE — Progress Notes (Signed)
Pre visit review using our clinic review tool, if applicable. No additional management support is needed unless otherwise documented below in the visit note. 

## 2016-08-01 NOTE — Assessment & Plan Note (Signed)
BP stable, continue current medications.  

## 2016-08-01 NOTE — Assessment & Plan Note (Signed)
Obtain follow up bmet.  

## 2016-08-02 ENCOUNTER — Encounter: Payer: Self-pay | Admitting: Family

## 2016-08-06 ENCOUNTER — Telehealth: Payer: Self-pay | Admitting: *Deleted

## 2016-08-06 DIAGNOSIS — H35033 Hypertensive retinopathy, bilateral: Secondary | ICD-10-CM | POA: Diagnosis not present

## 2016-08-06 DIAGNOSIS — H25011 Cortical age-related cataract, right eye: Secondary | ICD-10-CM | POA: Diagnosis not present

## 2016-08-06 DIAGNOSIS — H2511 Age-related nuclear cataract, right eye: Secondary | ICD-10-CM | POA: Diagnosis not present

## 2016-08-06 DIAGNOSIS — H401131 Primary open-angle glaucoma, bilateral, mild stage: Secondary | ICD-10-CM | POA: Diagnosis not present

## 2016-08-06 MED ORDER — GLUCOSE BLOOD VI STRP: ORAL_STRIP | 1 refills | Status: DC

## 2016-08-06 MED ORDER — ONETOUCH ULTRA MINI W/DEVICE KIT: PACK | 0 refills | Status: DC

## 2016-08-06 MED ORDER — ONETOUCH ULTRASOFT LANCETS MISC: 1 refills | Status: DC

## 2016-08-06 NOTE — Telephone Encounter (Signed)
Received fax from [facility] for One Touch Ultra M. Called [phone] and spoke with Tam. They are requesting one touch ultra mini glucometer and one touch ultra blue test strips as well as ultra soft lancets. Verbal rxs given to Tam.

## 2016-08-16 ENCOUNTER — Other Ambulatory Visit: Payer: Self-pay | Admitting: Family

## 2016-08-19 NOTE — Telephone Encounter (Signed)
Refill sent per [facility] refill protocol/SLS  

## 2016-08-26 DIAGNOSIS — H25811 Combined forms of age-related cataract, right eye: Secondary | ICD-10-CM | POA: Diagnosis not present

## 2016-08-26 DIAGNOSIS — H2511 Age-related nuclear cataract, right eye: Secondary | ICD-10-CM | POA: Diagnosis not present

## 2016-08-26 DIAGNOSIS — H25011 Cortical age-related cataract, right eye: Secondary | ICD-10-CM | POA: Diagnosis not present

## 2016-09-05 ENCOUNTER — Ambulatory Visit (INDEPENDENT_AMBULATORY_CARE_PROVIDER_SITE_OTHER): Payer: Medicare Other | Admitting: Physician Assistant

## 2016-09-05 ENCOUNTER — Encounter: Payer: Self-pay | Admitting: Physician Assistant

## 2016-09-05 VITALS — BP 125/80 | HR 75 | Temp 99.0°F | Resp 16 | Ht <= 58 in | Wt 130.0 lb

## 2016-09-05 DIAGNOSIS — E1122 Type 2 diabetes mellitus with diabetic chronic kidney disease: Secondary | ICD-10-CM

## 2016-09-05 DIAGNOSIS — J069 Acute upper respiratory infection, unspecified: Secondary | ICD-10-CM

## 2016-09-05 LAB — POCT INFLUENZA A: Rapid Influenza A Ag: NEGATIVE

## 2016-09-05 MED ORDER — BENZONATATE 200 MG PO CAPS: 200.0000 mg | ORAL_CAPSULE | Freq: Two times a day (BID) | ORAL | 0 refills | Status: DC | PRN

## 2016-09-05 MED ORDER — DOXYCYCLINE HYCLATE 100 MG PO TABS: 100.0000 mg | ORAL_TABLET | Freq: Two times a day (BID) | ORAL | 0 refills | Status: DC

## 2016-09-05 NOTE — Progress Notes (Signed)
Subjective:    Patient ID: Brittany Robles, female    DOB: 1940/10/17, 76 y.o.   MRN: [id number]  HPI  Brittany Robles is a 76 y/o female who presents with chief complaint of cough. She states that her cough started last Wednesday, today is day 9. Productive of yellow mucus at times. She has been taking Mucinex and cough drops. Denies any known sick contacts.  Received a flu shot this year. No fever, chills, body aches, sore throat, ear pain.  No history of asthma or pneumonia. Endorses good appetite presently.  She has type 2 diabetes that is diet controlled. She tells me that she hasn't checked her blood sugars since her cough started.   Review of Systems  See HPI  Past Medical History:  Diagnosis Date  . Bowel obstruction   . Carotid artery stenosis 03/08/2012   Bilateral- 40-50% per duplex 7/13.  Needs follow up duplex in 1 year.   . Carpal tunnel syndrome, bilateral   . Diabetes mellitus    Type 2  . Hypertension   . Osteopenia 04/27/2016   -1.8 femur 9/17  . POLYCYSTIC KIDNEY DISEASE 10/11/2009  . PONV (postoperative nausea and vomiting)   . 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 Spinal stenosis of lumbar region      Social History   Social History  . Marital status: Divorced    Spouse name: N/A  . Number of children: 3  . Years of education: N/A   Occupational History  . Retired    Social History Main Topics  . Smoking status: Former Smoker    Quit date: 04/18/2010  . Smokeless tobacco: Not on file  . Alcohol use 0.0 oz/week  . Drug use: No  . Sexual activity: Not on file   Other Topics Concern  . 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 ANTERIOR CERVICAL DECOMPRESSION/DISCECTOMY FUSION 4 LEVELS N/A 03/15/2013   Procedure: Cervical Three-Four Cervical Four-Five Cervical Five-Six Cervical Six-Seven  Anterior cervical decompression/diskectomy/fusion;  Surgeon: Floyce Stakes, MD;  Location: [place] [facility];  Service: Neurosurgery;  Laterality: N/A;  Cervical Three-Four Cervical Four-Five Cervical Five-Six Cervical Six-Seven Anterior cervical decompression/diskectomy/fusion  . APPENDECTOMY    . BREAST SURGERY     bx,   neg  . CARPAL TUNNEL RELEASE    . CESAREAN SECTION     x 2  . SMALL INTESTINE SURGERY     for bowel obstruction  . SPINE SURGERY     lumbar spinal stenosis    Family History  Problem Relation Age of Onset  . Cancer Mother     lung  . Cancer Sister     throat  . Cancer Brother     lung  . Cancer Other     Lung  . CAD Son 6    stent    Allergies  Allergen Reactions  . Cozaar [Losartan Potassium] Other (See Comments)    hyperkalemia  . Medrol [Methylprednisolone] Other (See Comments)    Near syncope, low BP  . Ibuprofen Other (See Comments)    Strange feelings and dreams  . Lisinopril Other (See Comments)    Current Outpatient Prescriptions on File Prior to Visit  Medication Sig Dispense Refill  . acetaminophen (TYLENOL) 325 MG tablet Take 650 mg by mouth every  6 (six) hours as needed for pain or fever.     Marland Kitchen amLODipine (NORVASC) 10 MG tablet Take 0.5 tablets (5 mg total) by mouth daily. 90 tablet 1  . aspirin EC 81 MG tablet Take 81 mg by mouth daily.    . Blood Glucose Monitoring Suppl (ONE TOUCH ULTRA MINI) w/Device KIT Use to check blood sugar once daily. 1 each 0  . brimonidine (ALPHAGAN) 0.15 % ophthalmic solution Use 2 drops in left eye daily    . Calcium Carbonate-Vitamin D (CALTRATE 600+D) 600-400 MG-UNIT per tablet Take 1 tablet by mouth daily.    . cholecalciferol (VITAMIN D) 1000 units tablet Take 1,000 Units by mouth daily.    Marland Kitchen glucose blood (ONE TOUCH ULTRA TEST) test strip Use as instructed to check blood sugar once a day. 100 each 1  . hydrochlorothiazide (HYDRODIURIL) 25 MG tablet TAKE 1 TABLET (25 MG TOTAL) BY MOUTH DAILY. 30 tablet 3    . Lancets (ONETOUCH ULTRASOFT) lancets Use as instructed to check blood sugar once a day. 100 each 1  . latanoprost (XALATAN) 0.005 % ophthalmic solution Place 1 drop into both eyes at bedtime.    Marland Kitchen nystatin ointment (MYCOSTATIN) Apply 1 application topically 2 (two) times daily. 30 g 1  . valACYclovir (VALTREX) 1000 MG tablet Take 1 tablet (1,000 mg total) by mouth daily as needed. 30 tablet 1   No current facility-administered medications on file prior to visit.     BP 125/80 (BP Location: Right Arm, Cuff Size: Normal)   Pulse 75   Temp 99 F (37.2 C) (Oral)   Resp 16   Ht '4\' 9"'  (1.448 m)   Wt 130 lb (59 kg)   LMP 08/19/1983   SpO2 98%   BMI 28.13 kg/m       Objective:   Physical Exam  Constitutional: Vital signs are normal. She appears well-developed and well-nourished. She is cooperative.  HENT:  Head: Normocephalic and atraumatic.  Right Ear: Tympanic membrane, external ear and ear canal normal. Tympanic membrane is not erythematous, not retracted and not bulging.  Left Ear: Tympanic membrane, external ear and ear canal normal. Tympanic membrane is not erythematous, not retracted and not bulging.  Nose: Right sinus exhibits no maxillary sinus tenderness and no frontal sinus tenderness. Left sinus exhibits no maxillary sinus tenderness and no frontal sinus tenderness.  Mouth/Throat: Posterior oropharyngeal edema present. No posterior oropharyngeal erythema.  Post-nasal drip present  Eyes: Conjunctivae are normal.  Cardiovascular: Normal rate, regular rhythm and normal heart sounds.   Pulmonary/Chest: Effort normal and breath sounds normal. No accessory muscle usage. No respiratory distress.  Clear to auscultation bilaterally, no wheezes, crackles, ronchi, rales  Lymphadenopathy:    She has no cervical adenopathy.  Neurological: She is alert.  Nursing note and vitals reviewed.   Results for orders placed or performed in visit on 09/05/16  POCT Influenza A  Result  Value Ref Range   Rapid Influenza A Ag neg       Assessment & Plan:  1. Upper respiratory infection with cough and congestion POCT Influenza A --> negative Given duration of symptoms, will treat URI with doxycycline per orders. Tessalon as needed for cough. Continue to rest and stay hydrated. Patient was advised to call if she develops fevers, shortness of breath or any other new/worrisome symptoms.  Inda Coke PA-C 09/05/16

## 2016-09-05 NOTE — Patient Instructions (Addendum)
It was great meeting you today!  Start the antibiotic and take as directed. You may use the cough medicine as needed for your cough. Stay hydrated and get plenty of rest.  Please call us if you start to spike fevers, develop shortness of breath, or any other worrisome symptoms.

## 2016-09-05 NOTE — Progress Notes (Signed)
Pre visit review using our clinic review tool, if applicable. No additional management support is needed unless otherwise documented below in the visit note. 

## 2016-09-22 ENCOUNTER — Telehealth: Payer: Self-pay | Admitting: *Deleted

## 2016-09-22 MED ORDER — HYDROCHLOROTHIAZIDE 25 MG PO TABS: 25.0000 mg | ORAL_TABLET | Freq: Every day | ORAL | 1 refills | Status: DC

## 2016-09-22 NOTE — Telephone Encounter (Signed)
Received fax from [facility] requesting refill of HCTZ. rx sent.

## 2016-09-23 ENCOUNTER — Telehealth: Payer: Self-pay | Admitting: Family

## 2016-09-23 NOTE — Telephone Encounter (Signed)
Called patient to schedule awv. Left msg for patient to call office to schedule appt.  °

## 2016-10-09 DIAGNOSIS — H401131 Primary open-angle glaucoma, bilateral, mild stage: Secondary | ICD-10-CM | POA: Diagnosis not present

## 2016-10-09 DIAGNOSIS — H40043 Steroid responder, bilateral: Secondary | ICD-10-CM | POA: Diagnosis not present

## 2016-11-10 DIAGNOSIS — H401131 Primary open-angle glaucoma, bilateral, mild stage: Secondary | ICD-10-CM | POA: Diagnosis not present

## 2016-11-10 DIAGNOSIS — H40051 Ocular hypertension, right eye: Secondary | ICD-10-CM | POA: Diagnosis not present

## 2016-11-10 DIAGNOSIS — H40043 Steroid responder, bilateral: Secondary | ICD-10-CM | POA: Diagnosis not present

## 2016-11-19 NOTE — Telephone Encounter (Signed)
AWV scheduled 11/25/16 prior to PCP appt.

## 2016-11-24 NOTE — Progress Notes (Signed)
Subjective:   Brittany Robles is a 76 y.o. female who presents for Medicare Annual (Subsequent) preventive examination.  Review of Systems:  No ROS.  Medicare Wellness Visit.  Cardiac Risk Factors include: advanced age (>27mn, >>56women);diabetes mellitus;dyslipidemia;hypertension  Sleep patterns: no sleep issues, gets up 1 times nightly to void and sleeps 8 hours nightly. Sometimes takes a daytime nap.   Home Safety/Smoke Alarms: Feels safe in home. Smoke alarms in place.   Living environment; residence and Firearm Safety: Lives w/ niece and her family, but is moving into her own apartment soon. She is looking for an apartment that is on one level. She is not particularly looking forward to the move, as she does not like being by herself, but states her family 'needs their space.' Current, she lives in a  2-story house, no firearms. Seat Belt Safety/Bike Helmet: Wears seat belt.   Counseling:   Eye Exam- Dr. HHerbert Deaneryearly. Dental- Does not follow w/ dentist regularly. Reports good dental hygiene.   Female:   Pap- N/A, aged out      [facility] last 10/31/14. BI-RADS CATEGORY  1: Negative.         Dexa scan- last 04/24/16. Osteopenia.        CCS- last 10/15/05. No report on file. Negative BioIQ FOBT in Sept. 2017.      Objective:     Vitals: BP 130/74   Pulse 75   Temp 97.9 F (36.6 C) (Oral)   Resp 16   Ht '4\' 10"'$  (1.473 m)   Wt 129 lb 3.2 oz (58.6 kg)   LMP 08/19/1983   SpO2 99%   BMI 27.00 kg/m   Body mass index is 27 kg/m.   Tobacco History  Smoking Status  . Former Smoker  . Quit date: 04/18/2010  Smokeless Tobacco  . Never Used     Counseling given: Not Answered   Past Medical History:  Diagnosis Date  . Bowel obstruction   . Carotid artery stenosis 03/08/2012   Bilateral- 40-50% per duplex 7/13.  Needs follow up duplex in 1 year.   . Carpal tunnel syndrome, bilateral   . Diabetes mellitus    Type 2  . Hypertension   . Osteopenia 04/27/2016   -1.8 femur 9/17    . POLYCYSTIC KIDNEY DISEASE 10/11/2009  . PONV (postoperative nausea and vomiting)   . 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 Spinal stenosis of lumbar region    Past Surgical History:  Procedure Laterality Date  . ANTERIOR CERVICAL DECOMPRESSION/DISCECTOMY FUSION 4 LEVELS N/A 03/15/2013   Procedure: Cervical Three-Four Cervical Four-Five Cervical Five-Six Cervical Six-Seven Anterior cervical decompression/diskectomy/fusion;  Surgeon: EFloyce Stakes MD;  [facility];  Service: Neurosurgery;  Laterality: N/A;  Cervical Three-Four Cervical Four-Five Cervical Five-Six Cervical Six-Seven Anterior cervical decompression/diskectomy/fusion  . APPENDECTOMY    . BREAST SURGERY     bx,   neg  . CARPAL TUNNEL RELEASE    . CESAREAN SECTION     x 2  . SMALL INTESTINE SURGERY     for bowel obstruction  . SPINE SURGERY     lumbar spinal stenosis   Family History  Problem Relation Age of Onset  . Cancer Mother     lung  . Cancer Sister     throat  . Cancer Brother     lung  . Cancer Other     Lung  . CAD Son 461   stent   History  Sexual Activity  .  Sexual activity: No    Outpatient Encounter Prescriptions as of 11/25/2016  Medication Sig  . acetaminophen (TYLENOL) 325 MG tablet Take 650 mg by mouth every 6 (six) hours as needed for pain or fever.   Marland Kitchen amLODipine (NORVASC) 10 MG tablet Take 0.5 tablets (5 mg total) by mouth daily.  Marland Kitchen aspirin EC 81 MG tablet Take 81 mg by mouth daily.  . Blood Glucose Monitoring Suppl (ONE TOUCH ULTRA MINI) w/Device KIT Use to check blood sugar once daily.  Marland Kitchen glucose blood (ONE TOUCH ULTRA TEST) test strip Use as instructed to check blood sugar once a day.  . hydrochlorothiazide (HYDRODIURIL) 25 MG tablet Take 1 tablet (25 mg total) by mouth daily.  . Lancets (ONETOUCH ULTRASOFT) lancets Use as instructed to check blood sugar once a day.  . latanoprost (XALATAN) 0.005 % ophthalmic solution Place 1 drop into both eyes at bedtime.  Marland Kitchen nystatin  ointment (MYCOSTATIN) Apply 1 application topically 2 (two) times daily. (Patient taking differently: Apply 1 application topically 2 (two) times daily as needed. )  . prednisoLONE acetate (PRED FORTE) 1 % ophthalmic suspension   . valACYclovir (VALTREX) 1000 MG tablet Take 1 tablet (1,000 mg total) by mouth daily as needed.  . Calcium Carbonate-Vitamin D (CALTRATE 600+D) 600-400 MG-UNIT per tablet Take 1 tablet by mouth daily.  . cholecalciferol (VITAMIN D) 1000 units tablet Take 1,000 Units by mouth daily.  . [DISCONTINUED] benzonatate (TESSALON) 200 MG capsule Take 1 capsule (200 mg total) by mouth 2 (two) times daily as needed for cough. (Patient not taking: Reported on 11/25/2016)  . [DISCONTINUED] doxycycline (VIBRA-TABS) 100 MG tablet Take 1 tablet (100 mg total) by mouth 2 (two) times daily. (Patient not taking: Reported on 11/25/2016)   No facility-administered encounter medications on file as of 11/25/2016.     Activities of Daily Living In your present state of health, do you have any difficulty performing the following activities: 11/25/2016 03/31/2016  Hearing? N N  Vision? N N  Difficulty concentrating or making decisions? N N  Walking or climbing stairs? N N  Dressing or bathing? N N  Doing errands, shopping? N N  Preparing Food and eating ? N N  Using the Toilet? N N  In the past six months, have you accidently leaked urine? N N  Do you have problems with loss of bowel control? N N  Managing your Medications? N N  Managing your Finances? N N  Housekeeping or managing your Housekeeping? N N  Some recent data might be hidden    Patient Care Team: Debbrah Alar, NP as PCP - General (Internal Medicine) Monna Fam, MD as Consulting Physician (Ophthalmology) Fleet Contras, MD as Consulting Physician (Nephrology) Leeroy Cha, MD as Consulting Physician (Neurosurgery)    Assessment:    Physical assessment deferred to PCP.  Exercise Activities and Dietary  recommendations Current Exercise Habits: Home exercise routine, Type of exercise: walking, Frequency (Times/Week): 7, Intensity: Mild. She stays active doing activities with her church and walking with her niece.   Diet (meal preparation, eat out, water intake, caffeinated beverages, dairy products, fruits and vegetables): Regular diet. Eats 2 meals daily and snacks throughout the day. Reports she does not eat a lot. Drinks water throughout the day.   Goals    . Patient Stated (pt-stated)          Stay as healthy as I possibly can.      Fall Risk Fall Risk  11/25/2016 09/05/2016 08/01/2016 03/31/2016 09/06/2014  Falls  in the past year? _0    Depression Screen PHQ 2/9 Scores 11/25/2016 09/05/2016 08/01/2016 03/31/2016  PHQ - 2 Score 0 0 0 0     Cognitive Function MMSE - Mini Mental State Exam 11/25/2016 03/31/2016  Orientation to time 5 5  Orientation to Place 5 5  Registration 3 3  Attention/ Calculation 4 4  Recall 3 3  Language- name 2 objects 2 2  Language- repeat 1 1  Language- follow 3 step command 3 3  Language- read & follow direction 1 1  Write a sentence 1 1  Copy design 1 1  Total score 29 29        Immunization History  Administered Date(s) Administered  . Influenza Split 05/10/2012  . Influenza,inj,Quad PF,36+ Mos 05/06/2013, 05/18/2015  . Influenza-Unspecified 04/18/2014, 07/02/2016  . Pneumococcal Conjugate-13 08/01/2013  . Pneumococcal Polysaccharide-23 06/13/2008  . Td 01/01/2004, 09/06/2014  . Zoster 02/15/2014   Screening Tests Health Maintenance  Topic Date Due  . COLONOSCOPY  10/16/2015  . OPHTHALMOLOGY EXAM  10/16/2016  . HEMOGLOBIN A1C  01/30/2017  . INFLUENZA VACCINE  03/18/2017  . URINE MICROALBUMIN  03/31/2017  . FOOT EXAM  11/25/2017  . TETANUS/TDAP  09/06/2024  . DEXA SCAN  Completed  . PNA vac Low Risk Adult  Completed      Plan:    Follow-up w/ PCP as directed.   Create and bring a copy of your advance directives to your  next office visit.  Eat heart healthy diet (full of fruits, vegetables, whole grains, lean protein, water--limit salt, fat, and sugar intake) and increase physical activity as tolerated.  Last eye exam will be requested from Dr. Herbert Deaner.  During the course of the visit the patient was educated and counseled about the following appropriate screening and preventive services:   Vaccines to include Pneumococcal, Influenza, Td, HCV  Cardiovascular Disease  Colorectal cancer screening  Bone density screening  Diabetes screening  Glaucoma screening  Nutrition counseling   Patient Instructions (the written plan) was given to the patient.   Dorrene German, RN  11/25/2016

## 2016-11-24 NOTE — Progress Notes (Signed)
Pre visit review using our clinic review tool, if applicable. No additional management support is needed unless otherwise documented below in the visit note. 

## 2016-11-25 ENCOUNTER — Ambulatory Visit (INDEPENDENT_AMBULATORY_CARE_PROVIDER_SITE_OTHER): Payer: Medicare Other | Admitting: Family

## 2016-11-25 ENCOUNTER — Encounter: Payer: Self-pay | Admitting: Family

## 2016-11-25 VITALS — BP 130/74 | HR 75 | Temp 97.9°F | Resp 16 | Ht <= 58 in | Wt 129.2 lb

## 2016-11-25 DIAGNOSIS — E1122 Type 2 diabetes mellitus with diabetic chronic kidney disease: Secondary | ICD-10-CM

## 2016-11-25 DIAGNOSIS — M5090 Cervical disc disorder, unspecified, unspecified cervical region: Secondary | ICD-10-CM | POA: Diagnosis not present

## 2016-11-25 DIAGNOSIS — I10 Essential (primary) hypertension: Secondary | ICD-10-CM | POA: Diagnosis not present

## 2016-11-25 DIAGNOSIS — E785 Hyperlipidemia, unspecified: Secondary | ICD-10-CM

## 2016-11-25 DIAGNOSIS — Q613 Polycystic kidney, unspecified: Secondary | ICD-10-CM | POA: Diagnosis not present

## 2016-11-25 DIAGNOSIS — Z0000 Encounter for general adult medical examination without abnormal findings: Secondary | ICD-10-CM | POA: Diagnosis not present

## 2016-11-25 LAB — BASIC METABOLIC PANEL
BUN: 25 mg/dL — AB (ref 6–23)
CHLORIDE: 100 meq/L (ref 96–112)
CO2: 31 mEq/L (ref 19–32)
Calcium: 9.5 mg/dL (ref 8.4–10.5)
Creatinine, Ser: 1.37 mg/dL — ABNORMAL HIGH (ref 0.40–1.20)
GFR: 48.3 mL/min — ABNORMAL LOW (ref >60.00)
Glucose, Bld: 136 mg/dL — ABNORMAL HIGH (ref 70–99)
POTASSIUM: 3.5 meq/L (ref 3.5–5.1)
Sodium: 137 mEq/L (ref 135–145)

## 2016-11-25 LAB — LIPID PANEL
Cholesterol: 188 mg/dL (ref 0–200)
HDL: 58.9 mg/dL (ref >39.00)
LDL Cholesterol: 118 mg/dL — ABNORMAL HIGH (ref 0–99)
NonHDL: 129.35
Total CHOL/HDL Ratio: 3
Triglycerides: 55 mg/dL (ref 0.0–149.0)
VLDL: 11 mg/dL (ref 0.0–40.0)

## 2016-11-25 LAB — MICROALBUMIN / CREATININE URINE RATIO
Creatinine,U: 133.6 mg/dL
Microalb Creat Ratio: 0.7 mg/g (ref 0.0–30.0)
Microalb, Ur: 1 mg/dL (ref 0.0–1.9)

## 2016-11-25 LAB — HEMOGLOBIN A1C: HEMOGLOBIN A1C: 7 % — AB (ref 4.6–6.5)

## 2016-11-25 MED ORDER — ATORVASTATIN CALCIUM 10 MG PO TABS: 10.0000 mg | ORAL_TABLET | Freq: Every day | ORAL | 3 refills | Status: DC

## 2016-11-25 NOTE — Patient Instructions (Addendum)
Create and bring a copy of your advance directives to your next office visit. Preventive Care 76 Years and Older, Female Preventive care refers to lifestyle choices and visits with your health care provider that can promote health and wellness. What does preventive care include?  A yearly physical exam. This is also called an annual well check.  Dental exams once or twice a year.  Routine eye exams. Ask your health care provider how often you should have your eyes checked.  Personal lifestyle choices, including:  Daily care of your teeth and gums.  Regular physical activity.  Eating a healthy diet.  Avoiding tobacco and drug use.  Limiting alcohol use.  Practicing safe sex.  Taking low-dose aspirin every day.  Taking vitamin and mineral supplements as recommended by your health care provider. What happens during an annual well check? The services and screenings done by your health care provider during your annual well check will depend on your age, overall health, lifestyle risk factors, and family history of disease. Counseling  Your health care provider may ask you questions about your:  Alcohol use.  Tobacco use.  Drug use.  Emotional well-being.  Home and relationship well-being.  Sexual activity.  Eating habits.  History of falls.  Memory and ability to understand (cognition).  Work and work environment.  Reproductive health. Screening  You may have the following tests or measurements:  Height, weight, and BMI.  Blood pressure.  Lipid and cholesterol levels. These may be checked every 5 years, or more frequently if you are over 50 years old.  Skin check.  Lung cancer screening. You may have this screening every year starting at age 55 if you have a 30-pack-year history of smoking and currently smoke or have quit within the past 15 years.  Fecal occult blood test (FOBT) of the stool. You may have this test every year starting at age  50.  Flexible sigmoidoscopy or colonoscopy. You may have a sigmoidoscopy every 5 years or a colonoscopy every 10 years starting at age 50.  Hepatitis C blood test.  Hepatitis B blood test.  Sexually transmitted disease (STD) testing.  Diabetes screening. This is done by checking your blood sugar (glucose) after you have not eaten for a while (fasting). You may have this done every 1-3 years.  Bone density scan. This is done to screen for osteoporosis. You may have this done starting at age 65.  Mammogram. This may be done every 1-2 years. Talk to your health care provider about how often you should have regular mammograms. Talk with your health care provider about your test results, treatment options, and if necessary, the need for more tests. Vaccines  Your health care provider may recommend certain vaccines, such as:  Influenza vaccine. This is recommended every year.  Tetanus, diphtheria, and acellular pertussis (Tdap, Td) vaccine. You may need a Td booster every 10 years.  Varicella vaccine. You may need this if you have not been vaccinated.  Zoster vaccine. You may need this after age 60.  Measles, mumps, and rubella (MMR) vaccine. You may need at least one dose of MMR if you were born in 1957 or later. You may also need a second dose.  Pneumococcal 13-valent conjugate (PCV13) vaccine. One dose is recommended after age 65.  Pneumococcal polysaccharide (PPSV23) vaccine. One dose is recommended after age 65.  Meningococcal vaccine. You may need this if you have certain conditions.  Hepatitis A vaccine. You may need this if you have certain conditions   or if you travel or work in places where you may be exposed to hepatitis A.  Hepatitis B vaccine. You may need this if you have certain conditions or if you travel or work in places where you may be exposed to hepatitis B.  Haemophilus influenzae type b (Hib) vaccine. You may need this if you have certain conditions. Talk to  your health care provider about which screenings and vaccines you need and how often you need them. This information is not intended to replace advice given to you by your health care provider. Make sure you discuss any questions you have with your health care provider. Document Released: 08/31/2015 Document Revised: 04/23/2016 Document Reviewed: 06/05/2015 Elsevier Interactive Patient Education  2017 Elsevier Inc.  

## 2016-11-25 NOTE — Assessment & Plan Note (Addendum)
New diagnosis for her. Check urine microalbumin. Continue ASA  once daily for cardiac prevention .

## 2016-11-25 NOTE — Assessment & Plan Note (Signed)
Stable on current medications. Obtain follow up bmet.

## 2016-11-25 NOTE — Assessment & Plan Note (Signed)
Obtain follow up Cr.

## 2016-11-25 NOTE — Addendum Note (Signed)
Addended by: Crissie Sickles A on: 11/25/2016 06:10 PM   Modules accepted: Orders

## 2016-11-25 NOTE — Assessment & Plan Note (Signed)
Obtain follow up lipid panel.  Goal will be more strict given new DM diagnosis.

## 2016-11-25 NOTE — Progress Notes (Signed)
Subjective:    Patient ID: Brittany Robles, female    DOB: 1940-11-27, 76 y.o.   MRN: [id number]  HPI   Brittany Robles is a 76 yr old female who presents today for follow up.   HTN- maintained on amlodipine and hctz.  BP Readings from Last 3 Encounters:  11/25/16 130/74  09/05/16 125/80  08/01/16 (!) 144/77   DM2-  Trying to adhere to a diabetic diet.  Lab Results  Component Value Date   HGBA1C 6.9 (H) 08/01/2016   Hyperlipidemia- not currently on statin.  Lab Results  Component Value Date   CHOL 217 (H) 03/31/2016   HDL 65.90 03/31/2016   LDLCALC 134 (H) 03/31/2016   TRIG 86.0 03/31/2016   CHOLHDL 3 03/31/2016   Hx of cervical disc surgery- reports that Dr. Joya Salm is no longer in network and she is requesting a referral to a different neurosurgeon who accepts her insurance.   Review of Systems See HPI  Past Medical History:  Diagnosis Date  . Bowel obstruction   . Carotid artery stenosis 03/08/2012   Bilateral- 40-50% per duplex 7/13.  Needs follow up duplex in 1 year.   . Carpal tunnel syndrome, bilateral   . Diabetes mellitus    Type 2  . Hypertension   . Osteopenia 04/27/2016   -1.8 femur 9/17  . POLYCYSTIC KIDNEY DISEASE 10/11/2009  . PONV (postoperative nausea and vomiting)   . 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 Spinal stenosis of lumbar region      Social History   Social History  . Marital status: Divorced    Spouse name: N/A  . Number of children: 3  . Years of education: N/A   Occupational History  . Retired    Social History Main Topics  . Smoking status: Former Smoker    Quit date: 04/18/2010  . Smokeless tobacco: Never Used  . Alcohol use 1.2 oz/week    2 Shots of liquor per week  . Drug use: No  . Sexual activity: No   Other Topics Concern  . 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 ANTERIOR CERVICAL DECOMPRESSION/DISCECTOMY FUSION 4 LEVELS N/A 03/15/2013   Procedure: Cervical Three-Four Cervical Four-Five Cervical Five-Six Cervical Six-Seven Anterior cervical decompression/diskectomy/fusion;  Surgeon: Floyce Stakes, MD;  Location: [facility];  Service: Neurosurgery;  Laterality: N/A;  Cervical Three-Four Cervical Four-Five Cervical Five-Six Cervical Six-Seven Anterior cervical decompression/diskectomy/fusion  . APPENDECTOMY    . BREAST SURGERY     bx,   neg  . CARPAL TUNNEL RELEASE    . CESAREAN SECTION     x 2  . SMALL INTESTINE SURGERY     for bowel obstruction  . SPINE SURGERY     lumbar spinal stenosis    Family History  Problem Relation Age of Onset  . Cancer Mother     lung  . Cancer Sister     throat  . Cancer Brother     lung  . Cancer Other     Lung  . CAD Son 76    stent    Allergies  Allergen Reactions  . Cozaar [Losartan Potassium] Other (See Comments)    hyperkalemia  . Medrol [Methylprednisolone] Other (See Comments)    Near syncope, low BP  . Ibuprofen Other (  See Comments)    Strange feelings and dreams  . Lisinopril Other (See Comments)    Unknown reaction    Current Outpatient Prescriptions on File Prior to Visit  Medication Sig Dispense Refill  . acetaminophen (TYLENOL) 325 MG tablet Take 650 mg by mouth every 6 (six) hours as needed for pain or fever.     Marland Kitchen amLODipine (NORVASC) 10 MG tablet Take 0.5 tablets (5 mg total) by mouth daily. 90 tablet 1  . aspirin EC 81 MG tablet Take 81 mg by mouth daily.    . Blood Glucose Monitoring Suppl (ONE TOUCH ULTRA MINI) w/Device KIT Use to check blood sugar once daily. 1 each 0  . glucose blood (ONE TOUCH ULTRA TEST) test strip Use as instructed to check blood sugar once a day. 100 each 1  . hydrochlorothiazide (HYDRODIURIL) 25 MG tablet Take 1 tablet (25 mg total) by mouth daily. 90 tablet 1  . Lancets (ONETOUCH ULTRASOFT) lancets Use as instructed to check blood sugar once a  day. 100 each 1  . latanoprost (XALATAN) 0.005 % ophthalmic solution Place 1 drop into both eyes at bedtime.    Marland Kitchen nystatin ointment (MYCOSTATIN) Apply 1 application topically 2 (two) times daily. (Patient taking differently: Apply 1 application topically 2 (two) times daily as needed. ) 30 g 1  . valACYclovir (VALTREX) 1000 MG tablet Take 1 tablet (1,000 mg total) by mouth daily as needed. 30 tablet 1  . Calcium Carbonate-Vitamin D (CALTRATE 600+D) 600-400 MG-UNIT per tablet Take 1 tablet by mouth daily.    . cholecalciferol (VITAMIN D) 1000 units tablet Take 1,000 Units by mouth daily.     No current facility-administered medications on file prior to visit.     BP 130/74   Pulse 75   Temp 97.9 F (36.6 C) (Oral)   Resp 16   Ht '4\' 10"'  (1.473 m)   Wt 129 lb 3.2 oz (58.6 kg)   LMP 08/19/1983   SpO2 99%   BMI 27.00 kg/m       Objective:   Physical Exam  Constitutional: She is oriented to person, place, and time. She appears well-developed and well-nourished.  HENT:  Head: Normocephalic and atraumatic.  Cardiovascular: Normal rate, regular rhythm and normal heart sounds.   No murmur heard. Pulmonary/Chest: Effort normal and breath sounds normal. No respiratory distress. She has no wheezes.  Musculoskeletal: She exhibits no edema.  Neurological: She is alert and oriented to person, place, and time.  Skin: Skin is warm and dry.  Psychiatric: She has a normal mood and affect. Her behavior is normal. Judgment and thought content normal.          Assessment & Plan:  Hx of cervical disc surgery- will try to find a neurosurgeon who is in her network.

## 2016-11-26 ENCOUNTER — Encounter: Payer: Self-pay | Admitting: Family

## 2016-11-27 NOTE — Progress Notes (Signed)
Noted and agree. 

## 2016-12-29 DIAGNOSIS — G959 Disease of spinal cord, unspecified: Secondary | ICD-10-CM | POA: Diagnosis not present

## 2016-12-29 DIAGNOSIS — M48061 Spinal stenosis, lumbar region without neurogenic claudication: Secondary | ICD-10-CM | POA: Diagnosis not present

## 2016-12-29 DIAGNOSIS — S14101S Unspecified injury at C1 level of cervical spinal cord, sequela: Secondary | ICD-10-CM | POA: Diagnosis not present

## 2016-12-29 DIAGNOSIS — M5136 Other intervertebral disc degeneration, lumbar region: Secondary | ICD-10-CM | POA: Diagnosis not present

## 2016-12-29 DIAGNOSIS — M5090 Cervical disc disorder, unspecified, unspecified cervical region: Secondary | ICD-10-CM | POA: Diagnosis not present

## 2016-12-31 DIAGNOSIS — Z961 Presence of intraocular lens: Secondary | ICD-10-CM | POA: Diagnosis not present

## 2016-12-31 DIAGNOSIS — H26493 Other secondary cataract, bilateral: Secondary | ICD-10-CM | POA: Diagnosis not present

## 2017-01-16 ENCOUNTER — Ambulatory Visit (INDEPENDENT_AMBULATORY_CARE_PROVIDER_SITE_OTHER): Payer: Medicare Other | Admitting: Family

## 2017-01-16 ENCOUNTER — Encounter: Payer: Self-pay | Admitting: Family

## 2017-01-16 VITALS — BP 140/90 | HR 76 | Temp 98.7°F | Resp 16 | Ht <= 58 in | Wt 126.0 lb

## 2017-01-16 DIAGNOSIS — M25512 Pain in left shoulder: Secondary | ICD-10-CM | POA: Diagnosis not present

## 2017-01-16 DIAGNOSIS — M25511 Pain in right shoulder: Secondary | ICD-10-CM | POA: Diagnosis not present

## 2017-01-16 NOTE — Patient Instructions (Signed)
Add tylenol as needed for your shoulder pain. We will work on getting you set up with the sports medicine doctor.

## 2017-01-16 NOTE — Progress Notes (Signed)
Subjective:    Patient ID: Brittany Robles, female    DOB: 04-Nov-1940, 76 y.o.   MRN: [id number]  HPI   Brittany Robles is a 76 yr old female who present today with chief complaint of right shoulder pain.   Pain has been present x 1 week.  Notes bilateral shoulder pain/tingling in both hands since her cervical surgery 2 years ago.   Review of Systems See HPI  Past Medical History:  Diagnosis Date  . Bowel obstruction ([place])   . Carotid artery stenosis 03/08/2012   Bilateral- 40-50% per duplex 7/13.  Needs follow up duplex in 1 year.   . Carpal tunnel syndrome, bilateral   . Diabetes mellitus    Type 2  . Hypertension   . Osteopenia 04/27/2016   -1.8 femur 9/17  . POLYCYSTIC KIDNEY DISEASE 10/11/2009  . PONV (postoperative nausea and vomiting)   . 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 Spinal stenosis of lumbar region      Social History   Social History  . Marital status: Divorced    Spouse name: N/A  . Number of children: 3  . Years of education: N/A   Occupational History  . Retired    Social History Main Topics  . Smoking status: Former Smoker    Quit date: 04/18/2010  . Smokeless tobacco: Never Used  . Alcohol use 1.2 oz/week    2 Shots of liquor per week  . Drug use: No  . Sexual activity: No   Other Topics Concern  . 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 ANTERIOR CERVICAL DECOMPRESSION/DISCECTOMY FUSION 4 LEVELS N/A 03/15/2013   Procedure: Cervical Three-Four Cervical Four-Five Cervical Five-Six Cervical Six-Seven Anterior cervical decompression/diskectomy/fusion;  Surgeon: Floyce Stakes, MD;  Location: [facility];  Service: Neurosurgery;  Laterality: N/A;  Cervical Three-Four Cervical Four-Five Cervical Five-Six Cervical Six-Seven Anterior cervical decompression/diskectomy/fusion  . APPENDECTOMY      . BREAST SURGERY     bx,   neg  . CARPAL TUNNEL RELEASE    . CESAREAN SECTION     x 2  . SMALL INTESTINE SURGERY     for bowel obstruction  . SPINE SURGERY     lumbar spinal stenosis    Family History  Problem Relation Age of Onset  . Cancer Mother        lung  . Cancer Sister        throat  . Cancer Brother        lung  . Cancer Other        Lung  . CAD Son 76       stent    Allergies  Allergen Reactions  . Shellfish-Derived Products Swelling  . Cozaar [Losartan Potassium] Other (See Comments)    hyperkalemia  . Medrol [Methylprednisolone] Other (See Comments)    Near syncope, low BP  . Ibuprofen Other (See Comments)    Strange feelings and dreams  . Lisinopril Other (See Comments)    Unknown reaction    Current Outpatient Prescriptions on File Prior to Visit  Medication Sig Dispense Refill  . acetaminophen (TYLENOL) 325 MG tablet Take 650 mg by mouth every 6 (six) hours as needed for pain or fever.     Marland Kitchen amLODipine (NORVASC) 10 MG tablet Take 0.5  tablets (5 mg total) by mouth daily. 90 tablet 1  . aspirin EC 81 MG tablet Take 81 mg by mouth daily.    Marland Kitchen atorvastatin (LIPITOR) 10 MG tablet Take 1 tablet (10 mg total) by mouth daily. 30 tablet 3  . Blood Glucose Monitoring Suppl (ONE TOUCH ULTRA MINI) w/Device KIT Use to check blood sugar once daily. 1 each 0  . Calcium Carbonate-Vitamin D (CALTRATE 600+D) 600-400 MG-UNIT per tablet Take 1 tablet by mouth daily.    . cholecalciferol (VITAMIN D) 1000 units tablet Take 1,000 Units by mouth daily.    Marland Kitchen glucose blood (ONE TOUCH ULTRA TEST) test strip Use as instructed to check blood sugar once a day. 100 each 1  . hydrochlorothiazide (HYDRODIURIL) 25 MG tablet Take 1 tablet (25 mg total) by mouth daily. 90 tablet 1  . Lancets (ONETOUCH ULTRASOFT) lancets Use as instructed to check blood sugar once a day. 100 each 1  . latanoprost (XALATAN) 0.005 % ophthalmic solution Place 1 drop into both eyes at bedtime.    Marland Kitchen  nystatin ointment (MYCOSTATIN) Apply 1 application topically 2 (two) times daily. (Patient taking differently: Apply 1 application topically 2 (two) times daily as needed. ) 30 g 1  . prednisoLONE acetate (PRED FORTE) 1 % ophthalmic suspension     . valACYclovir (VALTREX) 1000 MG tablet Take 1 tablet (1,000 mg total) by mouth daily as needed. 30 tablet 1   No current facility-administered medications on file prior to visit.     BP (!) 155/78 (BP Location: Left Arm, Cuff Size: Normal)   Pulse 76   Temp 98.7 F (37.1 C) (Oral)   Resp 16   Ht _0  (1.473 m)   Wt 126 lb (57.2 kg)   LMP 08/19/1983   SpO2 99%   BMI 26.33 kg/m       Objective:   Physical Exam  Constitutional: She appears well-developed and well-nourished.  Cardiovascular: Normal rate, regular rhythm and normal heart sounds.   No murmur heard. Pulmonary/Chest: Effort normal and breath sounds normal. No respiratory distress. She has no wheezes.  Musculoskeletal:  + pain with passive ROM of both shoulders R>L  Psychiatric: She has a normal mood and affect. Her behavior is normal. Judgment and thought content normal.          Assessment & Plan:  Bilateral shoulder pain- will refer to sports medicine.  She cannot take nsaids due to renal insufficiency.   Lab Results  Component Value Date   CREATININE 1.37 (H) 11/25/2016   I do think that some of her upper back pain is related to her cervical disease (which is being followed by Dr. Katherine Roan). She saw Dr. Romana Juniper on 12/29/16.   But her shoulder pain appears to be separate from this.

## 2017-01-23 ENCOUNTER — Ambulatory Visit: Payer: Medicare Other | Admitting: Family Medicine

## 2017-01-26 ENCOUNTER — Encounter: Payer: Self-pay | Admitting: Family Medicine

## 2017-01-26 ENCOUNTER — Ambulatory Visit (INDEPENDENT_AMBULATORY_CARE_PROVIDER_SITE_OTHER): Payer: Medicare Other | Admitting: Family Medicine

## 2017-01-26 VITALS — BP 146/82 | HR 78 | Ht 59.0 in | Wt 126.0 lb

## 2017-01-26 DIAGNOSIS — M25512 Pain in left shoulder: Secondary | ICD-10-CM | POA: Diagnosis not present

## 2017-01-26 DIAGNOSIS — G8929 Other chronic pain: Secondary | ICD-10-CM | POA: Diagnosis not present

## 2017-01-26 DIAGNOSIS — M25511 Pain in right shoulder: Secondary | ICD-10-CM

## 2017-01-26 MED ORDER — METHYLPREDNISOLONE ACETATE 40 MG/ML IJ SUSP
40.0000 mg | Freq: Once | INTRAMUSCULAR | Status: AC
  Administered 2017-01-26: 40 mg via INTRA_ARTICULAR

## 2017-01-26 NOTE — Patient Instructions (Addendum)
You have rotator cuff impingement Try to avoid painful activities (overhead activities, lifting with extended arm) as much as possible. Tylenol 500mg  1-2 tabs three times a day as needed for pain. Subacromial injection may be beneficial to help with pain and to decrease inflammation - you were given this on the right side today. If this works well for you over the next week we could do the same on the left shoulder. Consider physical therapy with transition to home exercise program. Do home exercise program with theraband and scapular stabilization exercises daily - these are very important for long term relief even if an injection was given.  Work your way up to 3 sets of 10 once a day with yellow theraband. Consider capsaicin topically up to 4 times a day instead of the tiger balm. If you haven't tried gabapentin, lyrica, or nortriptyline these are medications that may help with your nerve pain. Follow up with me in 1 month but call me sooner if you want to do the shot on the left side.

## 2017-01-26 NOTE — Progress Notes (Signed)
PCP and consultation requested by: Debbrah Alar, NP  Subjective:   HPI: Patient is a 76 y.o. female here for bilateral shoulder pain.  Patient has known issues with cervical spine s/p anterior discectomy and fusion. Unfortunately most recent neurosurgery visit was noted not much further can be done for this - offered hand desensitization therapy but she declined due to financial constraints. However past month she has had different pain in both shoulders, right worse than left. Pain felt laterally up to 10/10 and sharp. Taking tylenol, using tiger balm and TENS unit. Feels like motion is limited of arms. Pain worse trying to reach overhead. No skin changes.  Past Medical History:  Diagnosis Date  . Bowel obstruction ([place])   . Carotid artery stenosis 03/08/2012   Bilateral- 40-50% per duplex 7/13.  Needs follow up duplex in 1 year.   . Carpal tunnel syndrome, bilateral   . Diabetes mellitus    Type 2  . Hypertension   . Osteopenia 04/27/2016   -1.8 femur 9/17  . POLYCYSTIC KIDNEY DISEASE 10/11/2009  . PONV (postoperative nausea and vomiting)   . 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 Spinal stenosis of lumbar region     Current Outpatient Prescriptions on File Prior to Visit  Medication Sig Dispense Refill  . acetaminophen (TYLENOL) 325 MG tablet Take 650 mg by mouth every 6 (six) hours as needed for pain or fever.     Marland Kitchen amLODipine (NORVASC) 10 MG tablet Take 0.5 tablets (5 mg total) by mouth daily. 90 tablet 1  . aspirin EC 81 MG tablet Take 81 mg by mouth daily.    Marland Kitchen atorvastatin (LIPITOR) 10 MG tablet Take 1 tablet (10 mg total) by mouth daily. 30 tablet 3  . Blood Glucose Monitoring Suppl (ONE TOUCH ULTRA MINI) w/Device KIT Use to check blood sugar once daily. 1 each 0  . Calcium Carbonate-Vitamin D (CALTRATE 600+D) 600-400 MG-UNIT per tablet Take 1 tablet by mouth daily.    . cholecalciferol (VITAMIN D) 1000 units tablet Take 1,000 Units by mouth daily.    Marland Kitchen glucose  blood (ONE TOUCH ULTRA TEST) test strip Use as instructed to check blood sugar once a day. 100 each 1  . hydrochlorothiazide (HYDRODIURIL) 25 MG tablet Take 1 tablet (25 mg total) by mouth daily. 90 tablet 1  . Lancets (ONETOUCH ULTRASOFT) lancets Use as instructed to check blood sugar once a day. 100 each 1  . latanoprost (XALATAN) 0.005 % ophthalmic solution Place 1 drop into both eyes at bedtime.    Marland Kitchen nystatin ointment (MYCOSTATIN) Apply 1 application topically 2 (two) times daily. (Patient taking differently: Apply 1 application topically 2 (two) times daily as needed. ) 30 g 1  . prednisoLONE acetate (PRED FORTE) 1 % ophthalmic suspension     . valACYclovir (VALTREX) 1000 MG tablet Take 1 tablet (1,000 mg total) by mouth daily as needed. 30 tablet 1   No current facility-administered medications on file prior to visit.     Past Surgical History:  Procedure Laterality Date  . ANTERIOR CERVICAL DECOMPRESSION/DISCECTOMY FUSION 4 LEVELS N/A 03/15/2013   Procedure: Cervical Three-Four Cervical Four-Five Cervical Five-Six Cervical Six-Seven Anterior cervical decompression/diskectomy/fusion;  Surgeon: Floyce Stakes, MD;  Location: [facility];  Service: Neurosurgery;  Laterality: N/A;  Cervical Three-Four Cervical Four-Five Cervical Five-Six Cervical Six-Seven Anterior cervical decompression/diskectomy/fusion  . APPENDECTOMY    . BREAST SURGERY     bx,   neg  . CARPAL TUNNEL RELEASE    . CESAREAN  SECTION     x 2  . SMALL INTESTINE SURGERY     for bowel obstruction  . SPINE SURGERY     lumbar spinal stenosis    Allergies  Allergen Reactions  . Shellfish-Derived Products Swelling  . Cozaar [Losartan Potassium] Other (See Comments)    hyperkalemia  . Medrol [Methylprednisolone] Other (See Comments)    Near syncope, low BP  . Ibuprofen Other (See Comments)    Strange feelings and dreams  . Lisinopril Other (See Comments)    Unknown reaction    Social History   Social History   . Marital status: Divorced    Spouse name: N/A  . Number of children: 3  . Years of education: N/A   Occupational History  . Retired    Social History Main Topics  . Smoking status: Former Smoker    Quit date: 04/18/2010  . Smokeless tobacco: Never Used  . Alcohol use 1.2 oz/week    2 Shots of liquor per week  . Drug use: No  . Sexual activity: No   Other Topics Concern  . 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 Cancer Mother        lung  . Cancer Sister        throat  . Cancer Brother        lung  . Cancer Other        Lung  . CAD Son 62       stent    BP (!) 146/82   Pulse 78   Ht '4\' 11"'  (1.499 m)   Wt 126 lb (57.2 kg)   LMP 08/19/1983   BMI 25.45 kg/m   Review of Systems: See HPI above.     Objective:  Physical Exam:  Gen: NAD, comfortable in exam room  Bilateral shoulders: No swelling, ecchymoses.  Mild atrophy R > L supraspinatus fossae. TTP cervical paraspinal regions, trapezius, supraspinatus muscles. Full ER.  Abduction and flexion to 70 degrees on right, 90 on left. Positive Hawkins, Neers. Negative Yergasons. Strength 4/5 with empty can, 5/5 resisted internal/external rotation. NV intact distally.   Assessment & Plan:  1. Bilateral shoulder pain - Has evidence of rotator cuff impingement in addition to her underlying cervical spine issues.  Discussed options.  She will start with home exercises which were reviewed today with light theraband.  Tylenol, subacromial injection on right (can do on left if improving).  Consider physical therapy.  Capsaicin.  F/u in 1 month.  After informed written consent, patient was seated on exam table. Right shoulder was prepped with alcohol swab and utilizing posterior approach, patient's right subacromial space was injected with 3:1 bupivicaine: depomedrol.  Patient tolerated the procedure well without immediate complications.

## 2017-01-27 DIAGNOSIS — M25512 Pain in left shoulder: Secondary | ICD-10-CM

## 2017-01-27 DIAGNOSIS — M25511 Pain in right shoulder: Secondary | ICD-10-CM | POA: Insufficient documentation

## 2017-01-27 NOTE — Assessment & Plan Note (Signed)
Has evidence of rotator cuff impingement in addition to her underlying cervical spine issues.  Discussed options.  She will start with home exercises which were reviewed today with light theraband.  Tylenol, subacromial injection on right (can do on left if improving).  Consider physical therapy.  Capsaicin.  F/u in 1 month.  After informed written consent, patient was seated on exam table. Right shoulder was prepped with alcohol swab and utilizing posterior approach, patient's right subacromial space was injected with 3:1 bupivicaine: depomedrol. Patient tolerated the procedure well without immediate complications.

## 2017-01-28 DIAGNOSIS — H26493 Other secondary cataract, bilateral: Secondary | ICD-10-CM | POA: Diagnosis not present

## 2017-01-28 DIAGNOSIS — H179 Unspecified corneal scar and opacity: Secondary | ICD-10-CM | POA: Diagnosis not present

## 2017-01-28 DIAGNOSIS — Z961 Presence of intraocular lens: Secondary | ICD-10-CM | POA: Diagnosis not present

## 2017-02-05 ENCOUNTER — Other Ambulatory Visit: Payer: Self-pay | Admitting: Family

## 2017-02-05 ENCOUNTER — Ambulatory Visit (INDEPENDENT_AMBULATORY_CARE_PROVIDER_SITE_OTHER): Payer: Medicare Other | Admitting: Family Medicine

## 2017-02-05 ENCOUNTER — Encounter: Payer: Self-pay | Admitting: Family Medicine

## 2017-02-05 VITALS — BP 134/79 | HR 91 | Ht 59.0 in | Wt 128.0 lb

## 2017-02-05 DIAGNOSIS — M25512 Pain in left shoulder: Secondary | ICD-10-CM | POA: Diagnosis not present

## 2017-02-05 DIAGNOSIS — G8929 Other chronic pain: Secondary | ICD-10-CM

## 2017-02-05 DIAGNOSIS — M25511 Pain in right shoulder: Secondary | ICD-10-CM

## 2017-02-05 MED ORDER — METHYLPREDNISOLONE ACETATE 40 MG/ML IJ SUSP
40.0000 mg | Freq: Once | INTRAMUSCULAR | Status: AC
  Administered 2017-02-05: 40 mg via INTRA_ARTICULAR

## 2017-02-06 NOTE — Assessment & Plan Note (Signed)
Has evidence of rotator cuff impingement in addition to her underlying cervical spine issues.  Doing well following subacromial injection right shoulder - gave one today in left shoulder.  Continue home exercises for both.  Tylenol, capsaicin if needed.  F/u in 1 month.  After informed written consent, patient was seated on exam table. Left shoulder was prepped with alcohol swab and utilizing posterior approach, patient's left subacromial space was injected with 3:1 bupivicaine: depomedrol. Patient tolerated the procedure well without immediate complications.

## 2017-02-06 NOTE — Progress Notes (Signed)
PCP and consultation requested by: Debbrah Alar, NP  Subjective:   HPI: Patient is a 76 y.o. female here for bilateral shoulder pain.  6/11: Patient has known issues with cervical spine s/p anterior discectomy and fusion. Unfortunately most recent neurosurgery visit was noted not much further can be done for this - offered hand desensitization therapy but she declined due to financial constraints. However past month she has had different pain in both shoulders, right worse than left. Pain felt laterally up to 10/10 and sharp. Taking tylenol, using tiger balm and TENS unit. Feels like motion is limited of arms. Pain worse trying to reach overhead. No skin changes.  6/21: Patient returns reporting she is doing well - would like to go ahead with injection for left shoulder. Pain up to 7/10, sharp here. Right shoulder better after injection.  Past Medical History:  Diagnosis Date  . Bowel obstruction ([place])   . Carotid artery stenosis 03/08/2012   Bilateral- 40-50% per duplex 7/13.  Needs follow up duplex in 1 year.   . Carpal tunnel syndrome, bilateral   . Diabetes mellitus    Type 2  . Hypertension   . Osteopenia 04/27/2016   -1.8 femur 9/17  . POLYCYSTIC KIDNEY DISEASE 10/11/2009  . PONV (postoperative nausea and vomiting)   . 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 Spinal stenosis of lumbar region     Current Outpatient Prescriptions on File Prior to Visit  Medication Sig Dispense Refill  . acetaminophen (TYLENOL) 325 MG tablet Take 650 mg by mouth every 6 (six) hours as needed for pain or fever.     Marland Kitchen amLODipine (NORVASC) 10 MG tablet Take 0.5 tablets (5 mg total) by mouth daily. 90 tablet 1  . aspirin EC 81 MG tablet Take 81 mg by mouth daily.    Marland Kitchen atorvastatin (LIPITOR) 10 MG tablet Take 1 tablet (10 mg total) by mouth daily. 30 tablet 3  . Blood Glucose Monitoring Suppl (ONE TOUCH ULTRA MINI) w/Device KIT Use to check blood sugar once daily. 1 each 0  . Calcium  Carbonate-Vitamin D (CALTRATE 600+D) 600-400 MG-UNIT per tablet Take 1 tablet by mouth daily.    . cholecalciferol (VITAMIN D) 1000 units tablet Take 1,000 Units by mouth daily.    Marland Kitchen glucose blood (ONE TOUCH ULTRA TEST) test strip Use as instructed to check blood sugar once a day. 100 each 1  . Lancets (ONETOUCH ULTRASOFT) lancets Use as instructed to check blood sugar once a day. 100 each 1  . latanoprost (XALATAN) 0.005 % ophthalmic solution Place 1 drop into both eyes at bedtime.    Marland Kitchen nystatin ointment (MYCOSTATIN) Apply 1 application topically 2 (two) times daily. (Patient taking differently: Apply 1 application topically 2 (two) times daily as needed. ) 30 g 1  . prednisoLONE acetate (PRED FORTE) 1 % ophthalmic suspension     . valACYclovir (VALTREX) 1000 MG tablet Take 1 tablet (1,000 mg total) by mouth daily as needed. 30 tablet 1   No current facility-administered medications on file prior to visit.     Past Surgical History:  Procedure Laterality Date  . ANTERIOR CERVICAL DECOMPRESSION/DISCECTOMY FUSION 4 LEVELS N/A 03/15/2013   Procedure: Cervical Three-Four Cervical Four-Five Cervical Five-Six Cervical Six-Seven Anterior cervical decompression/diskectomy/fusion;  Surgeon: Floyce Stakes, MD;  Location: [place] [facility];  Service: Neurosurgery;  Laterality: N/A;  Cervical Three-Four Cervical Four-Five Cervical Five-Six Cervical Six-Seven Anterior cervical decompression/diskectomy/fusion  . APPENDECTOMY    . BREAST SURGERY     bx,  neg  . CARPAL TUNNEL RELEASE    . CESAREAN SECTION     x 2  . SMALL INTESTINE SURGERY     for bowel obstruction  . SPINE SURGERY     lumbar spinal stenosis    Allergies  Allergen Reactions  . Shellfish-Derived Products Swelling  . Cozaar [Losartan Potassium] Other (See Comments)    hyperkalemia  . Medrol [Methylprednisolone] Other (See Comments)    Near syncope, low BP  . Ibuprofen Other (See Comments)    Strange feelings and dreams  .  Lisinopril Other (See Comments)    Unknown reaction    Social History   Social History  . Marital status: Divorced    Spouse name: N/A  . Number of children: 3  . Years of education: N/A   Occupational History  . Retired    Social History Main Topics  . Smoking status: Former Smoker    Quit date: 04/18/2010  . Smokeless tobacco: Never Used  . Alcohol use 1.2 oz/week    2 Shots of liquor per week  . Drug use: No  . Sexual activity: No   Other Topics Concern  . 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 Cancer Mother        lung  . Cancer Sister        throat  . Cancer Brother        lung  . Cancer Other        Lung  . CAD Son 48       stent    BP 134/79   Pulse 91   Ht _0  (1.499 m)   Wt 128 lb (58.1 kg)   LMP 08/19/1983   BMI 25.85 kg/m   Review of Systems: See HPI above.     Objective:  Physical Exam:  Gen: NAD, comfortable in exam room  Exam not repeated today. Bilateral shoulders: No swelling, ecchymoses.  Mild atrophy R > L supraspinatus fossae. TTP cervical paraspinal regions, trapezius, supraspinatus muscles. Full ER.  Abduction and flexion to 70 degrees on right, 90 on left. Positive Hawkins, Neers. Negative Yergasons. Strength 4/5 with empty can, 5/5 resisted internal/external rotation. NV intact distally.   Assessment & Plan:  1. Bilateral shoulder pain - Has evidence of rotator cuff impingement in addition to her underlying cervical spine issues.  Doing well following subacromial injection right shoulder - gave one today in left shoulder.  Continue home exercises for both.  Tylenol, capsaicin if needed.  F/u in 1 month.  After informed written consent, patient was seated on exam table. Left shoulder was prepped with alcohol swab and utilizing posterior approach, patient's left  subacromial space was injected with 3:1 bupivicaine: depomedrol. Patient tolerated the procedure well without immediate complications.

## 2017-03-12 ENCOUNTER — Ambulatory Visit (HOSPITAL_BASED_OUTPATIENT_CLINIC_OR_DEPARTMENT_OTHER)
Admission: RE | Admit: 2017-03-12 | Discharge: 2017-03-12 | Disposition: A | Discharge status: Home / Self Care | Payer: Medicare Other | Source: Ambulatory Visit | Attending: Family Medicine | Admitting: Family Medicine

## 2017-03-12 ENCOUNTER — Encounter: Payer: Self-pay | Admitting: Family Medicine

## 2017-03-12 ENCOUNTER — Ambulatory Visit (INDEPENDENT_AMBULATORY_CARE_PROVIDER_SITE_OTHER): Payer: Medicare Other | Admitting: Family Medicine

## 2017-03-12 VITALS — BP 158/82 | HR 75 | Ht 59.0 in | Wt 130.0 lb

## 2017-03-12 DIAGNOSIS — I700 Atherosclerosis of aorta: Secondary | ICD-10-CM | POA: Insufficient documentation

## 2017-03-12 DIAGNOSIS — S4992XA Unspecified injury of left shoulder and upper arm, initial encounter: Secondary | ICD-10-CM

## 2017-03-12 DIAGNOSIS — M19012 Primary osteoarthritis, left shoulder: Secondary | ICD-10-CM | POA: Insufficient documentation

## 2017-03-12 MED ORDER — DIAZEPAM 5 MG PO TABS: ORAL_TABLET | ORAL | 0 refills | Status: DC

## 2017-03-12 MED ORDER — HYDROCODONE-ACETAMINOPHEN 5-325 MG PO TABS: 1.0000 | ORAL_TABLET | Freq: Four times a day (QID) | ORAL | 0 refills | Status: DC | PRN

## 2017-03-12 NOTE — Patient Instructions (Signed)
We will go ahead with an MRI of your shoulder to assess for rotator cuff tear and labrum tears. I will call you with results and next steps. Wear sling to support the shoulder. Take hydrocodone as needed for severe pain. Ice the shoulder 15 minutes at a time 3-4 times a day. You can't take ibuprofen unfortunately given your allergy - I would encourage biofreeze or capsaicin topically up to 4 times a day.

## 2017-03-13 DIAGNOSIS — S4992XD Unspecified injury of left shoulder and upper arm, subsequent encounter: Secondary | ICD-10-CM | POA: Insufficient documentation

## 2017-03-13 NOTE — Progress Notes (Signed)
PCP and consultation requested by: Debbrah Alar, NP  Subjective:   HPI: Patient is a 76 y.o. female here for bilateral shoulder pain.  6/11: Patient has known issues with cervical spine s/p anterior discectomy and fusion. Unfortunately most recent neurosurgery visit was noted not much further can be done for this - offered hand desensitization therapy but she declined due to financial constraints. However past month she has had different pain in both shoulders, right worse than left. Pain felt laterally up to 10/10 and sharp. Taking tylenol, using tiger balm and TENS unit. Feels like motion is limited of arms. Pain worse trying to reach overhead. No skin changes.  6/21: Patient returns reporting she is doing well - would like to go ahead with injection for left shoulder. Pain up to 7/10, sharp here. Right shoulder better after injection.  7/26: Patient reports yesterday she reached down with left arm to pick up bicycle from the ground and felt a sharp pain in left shoulder. Unsure if there were any pops when this occurred. Has not been able to use this arm since. Pain is 10/10 and sharp throughout shoulder. Using a pain patch and tylenol without much help. No skin changes, numbness.  Past Medical History:  Diagnosis Date  . Bowel obstruction ([place])   . Carotid artery stenosis 03/08/2012   Bilateral- 40-50% per duplex 7/13.  Needs follow up duplex in 1 year.   . Carpal tunnel syndrome, bilateral   . Diabetes mellitus    Type 2  . Hypertension   . Osteopenia 04/27/2016   -1.8 femur 9/17  . POLYCYSTIC KIDNEY DISEASE 10/11/2009  . PONV (postoperative nausea and vomiting)   . 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 Spinal stenosis of lumbar region     Current Outpatient Prescriptions on File Prior to Visit  Medication Sig Dispense Refill  . acetaminophen (TYLENOL) 325 MG tablet Take 650 mg by mouth every 6 (six) hours as needed for pain or fever.     Marland Kitchen amLODipine  (NORVASC) 10 MG tablet Take 0.5 tablets (5 mg total) by mouth daily. 90 tablet 1  . aspirin EC 81 MG tablet Take 81 mg by mouth daily.    Marland Kitchen atorvastatin (LIPITOR) 10 MG tablet Take 1 tablet (10 mg total) by mouth daily. 30 tablet 3  . Blood Glucose Monitoring Suppl (ONE TOUCH ULTRA MINI) w/Device KIT Use to check blood sugar once daily. 1 each 0  . Calcium Carbonate-Vitamin D (CALTRATE 600+D) 600-400 MG-UNIT per tablet Take 1 tablet by mouth daily.    . cholecalciferol (VITAMIN D) 1000 units tablet Take 1,000 Units by mouth daily.    Marland Kitchen glucose blood (ONE TOUCH ULTRA TEST) test strip Use as instructed to check blood sugar once a day. 100 each 1  . hydrochlorothiazide (HYDRODIURIL) 25 MG tablet TAKE 1 TABLET BY MOUTH  DAILY 90 tablet 0  . Lancets (ONETOUCH ULTRASOFT) lancets Use as instructed to check blood sugar once a day. 100 each 1  . latanoprost (XALATAN) 0.005 % ophthalmic solution Place 1 drop into both eyes at bedtime.    Marland Kitchen nystatin ointment (MYCOSTATIN) Apply 1 application topically 2 (two) times daily. (Patient taking differently: Apply 1 application topically 2 (two) times daily as needed. ) 30 g 1  . prednisoLONE acetate (PRED FORTE) 1 % ophthalmic suspension     . valACYclovir (VALTREX) 1000 MG tablet Take 1 tablet (1,000 mg total) by mouth daily as needed. 30 tablet 1   No current facility-administered medications on file  prior to visit.     Past Surgical History:  Procedure Laterality Date  . ANTERIOR CERVICAL DECOMPRESSION/DISCECTOMY FUSION 4 LEVELS N/A 03/15/2013   Procedure: Cervical Three-Four Cervical Four-Five Cervical Five-Six Cervical Six-Seven Anterior cervical decompression/diskectomy/fusion;  Surgeon: Floyce Stakes, MD;  Location: [facility];  Service: Neurosurgery;  Laterality: N/A;  Cervical Three-Four Cervical Four-Five Cervical Five-Six Cervical Six-Seven Anterior cervical decompression/diskectomy/fusion  . APPENDECTOMY    . BREAST SURGERY     bx,   neg  .  CARPAL TUNNEL RELEASE    . CESAREAN SECTION     x 2  . SMALL INTESTINE SURGERY     for bowel obstruction  . SPINE SURGERY     lumbar spinal stenosis    Allergies  Allergen Reactions  . Shellfish-Derived Products Swelling  . Cozaar [Losartan Potassium] Other (See Comments)    hyperkalemia  . Medrol [Methylprednisolone] Other (See Comments)    Near syncope, low BP  . Ibuprofen Other (See Comments)    Strange feelings and dreams  . Lisinopril Other (See Comments)    Unknown reaction    Social History   Social History  . Marital status: Divorced    Spouse name: N/A  . Number of children: 3  . Years of education: N/A   Occupational History  . Retired    Social History Main Topics  . Smoking status: Former Smoker    Quit date: 04/18/2010  . Smokeless tobacco: Never Used  . Alcohol use 1.2 oz/week    2 Shots of liquor per week  . Drug use: No  . Sexual activity: No   Other Topics Concern  . 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 Cancer Mother        lung  . Cancer Sister        throat  . Cancer Brother        lung  . Cancer Other        Lung  . CAD Son 6       stent    BP (!) 158/82   Pulse 75   Ht '4\' 11"'  (1.499 m)   Wt 130 lb (59 kg)   LMP 08/19/1983   BMI 26.26 kg/m   Review of Systems: See HPI above.     Objective:  Physical Exam:  Gen: NAD, uncomfortable.  Left shoulder: No swelling, ecchymoses.  Mild atrophy supraspinatus fossa. TTP cervical paraspinal regions, trapezius. Very limited motion - only 20 degrees ER.  Unable to flex or abduct. Cannot position for [employer]. Negative Yergasons. 5/5 resisted IR, 4/5 ER painful. NV intact distally.   Right shoulder: FROM without pain.  Assessment & Plan:  1. Left shoulder injury - mechanism of injury suggests inferior  subluxation of shoulder with subsequent either labrum and/or rotator cuff tears.  Extremely limited motion.  Sling for support.  Hydrocodone as needed, icing.  Independently reviewed radiographs and no bony abnormalities - will go ahead with MRI to assess.

## 2017-03-13 NOTE — Assessment & Plan Note (Signed)
mechanism of injury suggests inferior subluxation of shoulder with subsequent either labrum and/or rotator cuff tears.  Extremely limited motion.  Sling for support.  Hydrocodone as needed, icing.  Independently reviewed radiographs and no bony abnormalities - will go ahead with MRI to assess.

## 2017-03-22 ENCOUNTER — Other Ambulatory Visit: Payer: Self-pay | Admitting: Family

## 2017-03-23 NOTE — Telephone Encounter (Signed)
Rx sent to pharmacy. LB 

## 2017-04-02 DIAGNOSIS — E119 Type 2 diabetes mellitus without complications: Secondary | ICD-10-CM | POA: Diagnosis not present

## 2017-04-02 DIAGNOSIS — H40053 Ocular hypertension, bilateral: Secondary | ICD-10-CM | POA: Diagnosis not present

## 2017-04-02 DIAGNOSIS — H35033 Hypertensive retinopathy, bilateral: Secondary | ICD-10-CM | POA: Diagnosis not present

## 2017-04-02 DIAGNOSIS — H401131 Primary open-angle glaucoma, bilateral, mild stage: Secondary | ICD-10-CM | POA: Diagnosis not present

## 2017-04-02 LAB — HM DIABETES EYE EXAM

## 2017-04-07 DIAGNOSIS — M48061 Spinal stenosis, lumbar region without neurogenic claudication: Secondary | ICD-10-CM | POA: Diagnosis not present

## 2017-04-07 DIAGNOSIS — M5136 Other intervertebral disc degeneration, lumbar region: Secondary | ICD-10-CM | POA: Diagnosis not present

## 2017-04-07 DIAGNOSIS — M47816 Spondylosis without myelopathy or radiculopathy, lumbar region: Secondary | ICD-10-CM | POA: Diagnosis not present

## 2017-04-07 DIAGNOSIS — M5441 Lumbago with sciatica, right side: Secondary | ICD-10-CM | POA: Diagnosis not present

## 2017-04-07 DIAGNOSIS — M5442 Lumbago with sciatica, left side: Secondary | ICD-10-CM | POA: Diagnosis not present

## 2017-04-10 ENCOUNTER — Encounter: Payer: Self-pay | Admitting: Family

## 2017-05-06 DIAGNOSIS — M5136 Other intervertebral disc degeneration, lumbar region: Secondary | ICD-10-CM | POA: Diagnosis not present

## 2017-05-06 DIAGNOSIS — M461 Sacroiliitis, not elsewhere classified: Secondary | ICD-10-CM | POA: Diagnosis not present

## 2017-05-06 DIAGNOSIS — M5416 Radiculopathy, lumbar region: Secondary | ICD-10-CM | POA: Diagnosis not present

## 2017-05-06 DIAGNOSIS — I10 Essential (primary) hypertension: Secondary | ICD-10-CM | POA: Diagnosis not present

## 2017-05-06 DIAGNOSIS — M47816 Spondylosis without myelopathy or radiculopathy, lumbar region: Secondary | ICD-10-CM | POA: Diagnosis not present

## 2017-05-13 ENCOUNTER — Telehealth: Payer: Self-pay | Admitting: Family

## 2017-05-13 NOTE — Telephone Encounter (Signed)
Pt called says she would like Melissa to order xray for shoulder instead of making her come in. Pt ph [phone]. Pt referred to ortho but pt does not have $50 at this time for appt there. Pt states injury occurred when picking up a bike a couple months ago. Please advise.

## 2017-05-14 NOTE — Telephone Encounter (Signed)
I reviewed Dr. Lazaro Arms note.It appears that she may have a labrum or rotator cuff tear. He recommended MRI. X ray will not be helpful.  I would recommend that she contact Dr. Pearletha Forge to see if he can order the MRI and/or follow up with him when she is able.

## 2017-05-15 ENCOUNTER — Encounter: Payer: Self-pay | Admitting: Family Medicine

## 2017-05-15 ENCOUNTER — Ambulatory Visit (INDEPENDENT_AMBULATORY_CARE_PROVIDER_SITE_OTHER): Payer: Medicare Other | Admitting: Family Medicine

## 2017-05-15 DIAGNOSIS — S4992XD Unspecified injury of left shoulder and upper arm, subsequent encounter: Secondary | ICD-10-CM

## 2017-05-15 MED ORDER — METHYLPREDNISOLONE ACETATE 40 MG/ML IJ SUSP
40.0000 mg | Freq: Once | INTRAMUSCULAR | Status: AC
  Administered 2017-05-15: 40 mg via INTRA_ARTICULAR

## 2017-05-15 NOTE — Telephone Encounter (Signed)
Called and they hung up/tried to call 2 more times and it was busy/thx dmf

## 2017-05-15 NOTE — Patient Instructions (Signed)
I'm concerned you have a rotator cuff tear. We discussed the risks of not having surgery for this - decreased motion, strength, possibility of persistent pain - call me if you change your mind and want to do the imaging. You were given an injection today. Do arm circles, swings, table slide exercises 3 sets of 10 once or twice a day. Do theraband strengthening exercises with theraband - work way up to 3 sets of 10 once a day. Icing as needed. Follow up with me in 1 month for reevaluation.

## 2017-05-17 NOTE — Assessment & Plan Note (Signed)
consistent with full thickness supraspinatus tear.  We had a long discussion about this and loss of motion, strength that would be permanent if she doesn't have this repaired.  She is adamant about not wanting any type of surgical intervention and instead wants injection.  She verbalized understanding of the above.  Subacromial injection given.  Shown motion and strengthening exercises.  Consider PT.  F/u in 1 month.  Tylenol, icing if needed.  After informed written consent timeout was performed, patient was seated on exam table. Left shoulder was prepped with alcohol swab and utilizing posterior approach, patient's left subacromial space was injected with 3:1 bupivicaine: depomedrol. Patient tolerated the procedure well without immediate complications.

## 2017-05-17 NOTE — Progress Notes (Addendum)
PCP and consultation requested by: Debbrah Alar, NP  Subjective:   HPI: Patient is a 76 y.o. female here for bilateral shoulder pain.  6/11: Patient has known issues with cervical spine s/p anterior discectomy and fusion. Unfortunately most recent neurosurgery visit was noted not much further can be done for this - offered hand desensitization therapy but she declined due to financial constraints. However past month she has had different pain in both shoulders, right worse than left. Pain felt laterally up to 10/10 and sharp. Taking tylenol, using tiger balm and TENS unit. Feels like motion is limited of arms. Pain worse trying to reach overhead. No skin changes.  6/21: Patient returns reporting she is doing well - would like to go ahead with injection for left shoulder. Pain up to 7/10, sharp here. Right shoulder better after injection.  7/26: Patient reports yesterday she reached down with left arm to pick up bicycle from the ground and felt a sharp pain in left shoulder. Unsure if there were any pops when this occurred. Has not been able to use this arm since. Pain is 10/10 and sharp throughout shoulder. Using a pain patch and tylenol without much help. No skin changes, numbness.  9/28: Patient returns with persistent 8/10 level left shoulder pain laterally. Pain sharp and motion is limited. Using icy hot which helps. Worse at night, not sleeping well. No skin changes, numbness.  Past Medical History:  Diagnosis Date  . Bowel obstruction ([place])   . Carotid artery stenosis 03/08/2012   Bilateral- 40-50% per duplex 7/13.  Needs follow up duplex in 1 year.   . Carpal tunnel syndrome, bilateral   . Diabetes mellitus    Type 2  . Hypertension   . Osteopenia 04/27/2016   -1.8 femur 9/17  . POLYCYSTIC KIDNEY DISEASE 10/11/2009  . PONV (postoperative nausea and vomiting)   . 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 Spinal stenosis of lumbar region     Current  Outpatient Prescriptions on File Prior to Visit  Medication Sig Dispense Refill  . acetaminophen (TYLENOL) 325 MG tablet Take 650 mg by mouth every 6 (six) hours as needed for pain or fever.     Marland Kitchen amLODipine (NORVASC) 10 MG tablet Take 0.5 tablets (5 mg total) by mouth daily. 90 tablet 1  . aspirin EC 81 MG tablet Take 81 mg by mouth daily.    Marland Kitchen atorvastatin (LIPITOR) 10 MG tablet TAKE 1 TABLET BY MOUTH EVERY DAY 30 tablet 3  . Blood Glucose Monitoring Suppl (ONE TOUCH ULTRA MINI) w/Device KIT Use to check blood sugar once daily. 1 each 0  . Calcium Carbonate-Vitamin D (CALTRATE 600+D) 600-400 MG-UNIT per tablet Take 1 tablet by mouth daily.    . cholecalciferol (VITAMIN D) 1000 units tablet Take 1,000 Units by mouth daily.    . diazepam (VALIUM) 5 MG tablet 1 tab PO 30 minutes prior to procedure - may repeat x1 2 tablet 0  . glucose blood (ONE TOUCH ULTRA TEST) test strip Use as instructed to check blood sugar once a day. 100 each 1  . hydrochlorothiazide (HYDRODIURIL) 25 MG tablet TAKE 1 TABLET BY MOUTH  DAILY 90 tablet 0  . HYDROcodone-acetaminophen (NORCO) 5-325 MG tablet Take 1 tablet by mouth every 6 (six) hours as needed for moderate pain. 20 tablet 0  . Lancets (ONETOUCH ULTRASOFT) lancets Use as instructed to check blood sugar once a day. 100 each 1  . latanoprost (XALATAN) 0.005 % ophthalmic solution Place 1 drop into both  eyes at bedtime.    Marland Kitchen nystatin ointment (MYCOSTATIN) Apply 1 application topically 2 (two) times daily. (Patient taking differently: Apply 1 application topically 2 (two) times daily as needed. ) 30 g 1  . prednisoLONE acetate (PRED FORTE) 1 % ophthalmic suspension     . valACYclovir (VALTREX) 1000 MG tablet Take 1 tablet (1,000 mg total) by mouth daily as needed. 30 tablet 1   No current facility-administered medications on file prior to visit.     Past Surgical History:  Procedure Laterality Date  . ANTERIOR CERVICAL DECOMPRESSION/DISCECTOMY FUSION 4 LEVELS N/A  03/15/2013   Procedure: Cervical Three-Four Cervical Four-Five Cervical Five-Six Cervical Six-Seven Anterior cervical decompression/diskectomy/fusion;  Surgeon: Floyce Stakes, MD;  Location: [facility];  Service: Neurosurgery;  Laterality: N/A;  Cervical Three-Four Cervical Four-Five Cervical Five-Six Cervical Six-Seven Anterior cervical decompression/diskectomy/fusion  . APPENDECTOMY    . BREAST SURGERY     bx,   neg  . CARPAL TUNNEL RELEASE    . CESAREAN SECTION     x 2  . SMALL INTESTINE SURGERY     for bowel obstruction  . SPINE SURGERY     lumbar spinal stenosis    Allergies  Allergen Reactions  . Shellfish-Derived Products Swelling  . Cozaar [Losartan Potassium] Other (See Comments)    hyperkalemia  . Medrol [Methylprednisolone] Other (See Comments)    Near syncope, low BP  . Ibuprofen Other (See Comments)    Strange feelings and dreams  . Lisinopril Other (See Comments)    Unknown reaction    Social History   Social History  . Marital status: Divorced    Spouse name: N/A  . Number of children: 3  . Years of education: N/A   Occupational History  . Retired    Social History Main Topics  . Smoking status: Former Smoker    Quit date: 04/18/2010  . Smokeless tobacco: Never Used  . Alcohol use 1.2 oz/week    2 Shots of liquor per week  . Drug use: No  . Sexual activity: No   Other Topics Concern  . 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 Cancer Mother        lung  . Cancer Sister        throat  . Cancer Brother        lung  . Cancer Other        Lung  . CAD Son 60       stent    BP (!) 153/99   Ht '4\' 11"'  (1.499 m)   Wt 130 lb (59 kg)   LMP 08/19/1983   BMI 26.26 kg/m   Review of Systems: See HPI above.     Objective:  Physical Exam:  Gen: NAD, comfortable in exam room.  Left  shoulder: No swelling, ecchymoses.  Mild atrophy supraspinatus fossa. No TTP currently. Motion limited to 45 degrees abduction and flexion.  Full IR and ER. Pain with neers.  Cannot position for [employer]. Negative yergasons. + drop arm. Strength 5/5 with ER and IR.   NV intact distally.  Right shoulder: FROM without pain.  Assessment & Plan:  1. Left shoulder injury - consistent with full thickness supraspinatus tear.  We had a long discussion about this and loss of motion,  strength that would be permanent if she doesn't have this repaired.  She is adamant about not wanting any type of surgical intervention and instead wants injection.  She verbalized understanding of the above.  Subacromial injection given.  Shown motion and strengthening exercises.  Consider PT.  F/u in 1 month.  Tylenol, icing if needed.  After informed written consent timeout was performed, patient was seated on exam table. Left shoulder was prepped with alcohol swab and utilizing posterior approach, patient's left subacromial space was injected with 3:1 bupivicaine: depomedrol. Patient tolerated the procedure well without immediate complications.  Addendum: MRI reviewed and discussed with patient.  She has full thickness supraspinatus and infraspinatus tears - unfortunately with retraction and fatty atrophy.  These make her a poor candidate for surgical repair.  Not had much benefit with injection last visit.  She would like to try nitro patches.  Declined PT citing cost.  Will continue with home exercises, plans to follow up in 6 weeks.

## 2017-05-18 NOTE — Telephone Encounter (Signed)
Notified pt and she voices understanding. 

## 2017-05-23 ENCOUNTER — Ambulatory Visit (HOSPITAL_BASED_OUTPATIENT_CLINIC_OR_DEPARTMENT_OTHER)
Admission: RE | Admit: 2017-05-23 | Discharge: 2017-05-23 | Disposition: A | Discharge status: Home / Self Care | Payer: Medicare Other | Source: Ambulatory Visit | Attending: Family Medicine | Admitting: Family Medicine

## 2017-05-23 DIAGNOSIS — S46812A Strain of other muscles, fascia and tendons at shoulder and upper arm level, left arm, initial encounter: Secondary | ICD-10-CM | POA: Insufficient documentation

## 2017-05-23 DIAGNOSIS — X58XXXA Exposure to other specified factors, initial encounter: Secondary | ICD-10-CM | POA: Diagnosis not present

## 2017-05-23 DIAGNOSIS — M6258 Muscle wasting and atrophy, not elsewhere classified, other site: Secondary | ICD-10-CM | POA: Diagnosis not present

## 2017-05-23 DIAGNOSIS — S46212A Strain of muscle, fascia and tendon of other parts of biceps, left arm, initial encounter: Secondary | ICD-10-CM | POA: Diagnosis not present

## 2017-05-23 DIAGNOSIS — M75122 Complete rotator cuff tear or rupture of left shoulder, not specified as traumatic: Secondary | ICD-10-CM | POA: Diagnosis not present

## 2017-05-23 DIAGNOSIS — S4992XA Unspecified injury of left shoulder and upper arm, initial encounter: Secondary | ICD-10-CM | POA: Diagnosis present

## 2017-05-23 DIAGNOSIS — M75102 Unspecified rotator cuff tear or rupture of left shoulder, not specified as traumatic: Secondary | ICD-10-CM | POA: Diagnosis not present

## 2017-05-27 MED ORDER — NITROGLYCERIN 0.2 MG/HR TD PT24: MEDICATED_PATCH | TRANSDERMAL | 1 refills | Status: DC

## 2017-05-27 NOTE — Addendum Note (Signed)
Addended by: Lenda Kelp on: 05/27/2017 03:47 PM   Modules accepted: Orders

## 2017-06-16 ENCOUNTER — Telehealth: Payer: Self-pay | Admitting: Family

## 2017-06-16 NOTE — Telephone Encounter (Signed)
Pt last seen 01/16/17 and was advised follow up in October and is past due. Please call pt to schedule appt with Melissa soon. Thanks!

## 2017-06-22 NOTE — Telephone Encounter (Signed)
Pt has been scheduled.  °

## 2017-06-25 DIAGNOSIS — H401131 Primary open-angle glaucoma, bilateral, mild stage: Secondary | ICD-10-CM | POA: Diagnosis not present

## 2017-06-25 DIAGNOSIS — H40051 Ocular hypertension, right eye: Secondary | ICD-10-CM | POA: Diagnosis not present

## 2017-06-25 DIAGNOSIS — H40052 Ocular hypertension, left eye: Secondary | ICD-10-CM | POA: Diagnosis not present

## 2017-06-25 DIAGNOSIS — H40043 Steroid responder, bilateral: Secondary | ICD-10-CM | POA: Diagnosis not present

## 2017-06-30 ENCOUNTER — Encounter: Payer: Self-pay | Admitting: Family

## 2017-06-30 ENCOUNTER — Other Ambulatory Visit: Payer: Self-pay | Admitting: Family

## 2017-06-30 ENCOUNTER — Ambulatory Visit (INDEPENDENT_AMBULATORY_CARE_PROVIDER_SITE_OTHER): Payer: Medicare Other | Admitting: Family

## 2017-06-30 VITALS — BP 144/71 | HR 87 | Temp 99.1°F | Ht 59.0 in | Wt 120.0 lb

## 2017-06-30 DIAGNOSIS — E785 Hyperlipidemia, unspecified: Secondary | ICD-10-CM | POA: Diagnosis not present

## 2017-06-30 DIAGNOSIS — E1122 Type 2 diabetes mellitus with diabetic chronic kidney disease: Secondary | ICD-10-CM | POA: Diagnosis not present

## 2017-06-30 DIAGNOSIS — I10 Essential (primary) hypertension: Secondary | ICD-10-CM

## 2017-06-30 DIAGNOSIS — E119 Type 2 diabetes mellitus without complications: Secondary | ICD-10-CM

## 2017-06-30 DIAGNOSIS — I6529 Occlusion and stenosis of unspecified carotid artery: Secondary | ICD-10-CM

## 2017-06-30 MED ORDER — METOPROLOL SUCCINATE ER 25 MG PO TB24: 25.0000 mg | ORAL_TABLET | Freq: Every day | ORAL | 2 refills | Status: DC

## 2017-06-30 MED ORDER — ATORVASTATIN CALCIUM 10 MG PO TABS: 10.0000 mg | ORAL_TABLET | Freq: Every day | ORAL | 1 refills | Status: DC

## 2017-06-30 NOTE — Patient Instructions (Signed)
Please complete lab work prior to leaving. You will be contact about scheduling the ultrasound of your carotids.  Add Toprol xl 25mg  once daily for your blood pressure.

## 2017-06-30 NOTE — Progress Notes (Signed)
Subjective:    Patient ID: Brittany Robles, female    DOB: 1940/09/09, 76 y.o.   MRN: [id number]  HPI  Brittany Robles is a 76 yr old female who presents today for follow up.  1) DM2- currently maintained on diet only.  Lab Results  Component Value Date   HGBA1C 7.0 (H) 11/25/2016   HGBA1C 6.9 (H) 08/01/2016   HGBA1C 6.5 03/31/2016   Lab Results  Component Value Date   MICROALBUR 1.0 11/25/2016   LDLCALC 118 (H) 11/25/2016   CREATININE 1.37 (H) 11/25/2016   2) HTN- bp meds include hctz, amlodipine,  BP Readings from Last 3 Encounters:  06/30/17 (!) 144/71  05/15/17 (!) 153/99  03/12/17 (!) 158/82   3) Hyperlipidemia- maintained on lipitor.  Denies any adverse effects to Lipitor such as myalgia.  She does have history of spinal stenosis with some chronic low back pain.  Notes that she has some pain in the left leg radiating down at night.   Review of Systems See HPI  Past Medical History:  Diagnosis Date  . Bowel obstruction ([place])   . Carotid artery stenosis 03/08/2012   Bilateral- 40-50% per duplex 7/13.  Needs follow up duplex in 1 year.   . Carpal tunnel syndrome, bilateral   . Diabetes mellitus    Type 2  . Hypertension   . Osteopenia 04/27/2016   -1.8 femur 9/17  . POLYCYSTIC KIDNEY DISEASE 10/11/2009  . PONV (postoperative nausea and vomiting)   . 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 Spinal stenosis of lumbar region      Social History   Socioeconomic History  . Marital status: Divorced    Spouse name: Not on file  . Number of children: 3  . Years of education: Not on file  . Highest education level: Not on file  Social Needs  . Financial resource strain: Not on file  . Food insecurity - worry: Not on file  . Food insecurity - inability: Not on file  . Transportation needs - medical: Not on file  . Transportation needs - non-medical: Not on file  Occupational History  . Occupation: Retired  Tobacco Use  . Smoking status: Former Smoker    Last  attempt to quit: 04/18/2010    Years since quitting: 7.2  . Smokeless tobacco: Never Used  Substance and Sexual Activity  . Alcohol use: Yes    Alcohol/week: 1.2 oz    Types: 2 Shots of liquor per week  . Drug use: No  . Sexual activity: No  Other Topics Concern  . 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 APPENDECTOMY    . BREAST SURGERY     bx,   neg  . CARPAL TUNNEL RELEASE    . CESAREAN SECTION     x 2  . SMALL INTESTINE SURGERY     for bowel obstruction  . SPINE SURGERY     lumbar spinal stenosis    Family History  Problem Relation Age of Onset  . Cancer Mother        lung  . Cancer Sister        throat  . Cancer Brother        lung  . Cancer Other        Lung  . CAD  Son 48       stent    Allergies  Allergen Reactions  . Shellfish-Derived Products Swelling  . Cozaar [Losartan Potassium] Other (See Comments)    hyperkalemia  . Medrol [Methylprednisolone] Other (See Comments)    Near syncope, low BP  . Ibuprofen Other (See Comments)    Strange feelings and dreams  . Lisinopril Other (See Comments)    Unknown reaction    Current Outpatient Medications on File Prior to Visit  Medication Sig Dispense Refill  . acetaminophen (TYLENOL) 325 MG tablet Take 650 mg by mouth every 6 (six) hours as needed for pain or fever.     Marland Kitchen amLODipine (NORVASC) 10 MG tablet Take 0.5 tablets (5 mg total) by mouth daily. 90 tablet 1  . aspirin EC 81 MG tablet Take 81 mg by mouth daily.    Marland Kitchen atorvastatin (LIPITOR) 10 MG tablet TAKE 1 TABLET BY MOUTH EVERY DAY 30 tablet 3  . Blood Glucose Monitoring Suppl (ONE TOUCH ULTRA MINI) w/Device KIT Use to check blood sugar once daily. 1 each 0  . Calcium Carbonate-Vitamin D (CALTRATE 600+D) 600-400 MG-UNIT per tablet Take 1 tablet by mouth daily.    . cholecalciferol (VITAMIN D) 1000  units tablet Take 1,000 Units by mouth daily.    . diazepam (VALIUM) 5 MG tablet 1 tab PO 30 minutes prior to procedure - may repeat x1 2 tablet 0  . glucose blood (ONE TOUCH ULTRA TEST) test strip Use as instructed to check blood sugar once a day. 100 each 1  . hydrochlorothiazide (HYDRODIURIL) 25 MG tablet TAKE 1 TABLET BY MOUTH  DAILY 90 tablet 1  . HYDROcodone-acetaminophen (NORCO) 5-325 MG tablet Take 1 tablet by mouth every 6 (six) hours as needed for moderate pain. 20 tablet 0  . Lancets (ONETOUCH ULTRASOFT) lancets Use as instructed to check blood sugar once a day. 100 each 1  . latanoprost (XALATAN) 0.005 % ophthalmic solution Place 1 drop into both eyes at bedtime.    . prednisoLONE acetate (PRED FORTE) 1 % ophthalmic suspension     . valACYclovir (VALTREX) 1000 MG tablet Take 1 tablet (1,000 mg total) by mouth daily as needed. 30 tablet 1  . nystatin ointment (MYCOSTATIN) Apply 1 application topically 2 (two) times daily. (Patient taking differently: Apply 1 application topically 2 (two) times daily as needed. ) 30 g 1   No current facility-administered medications on file prior to visit.     BP (!) 144/71 (BP Location: Right Arm, Cuff Size: Normal)   Pulse 87   Temp 99.1 F (37.3 C) (Oral)   Ht _0  (1.499 m)   Wt 120 lb (54.4 kg)   LMP 08/19/1983   SpO2 100%   BMI 24.24 kg/m       Objective:   Physical Exam  Constitutional: She is oriented to person, place, and time. She appears well-developed and well-nourished.  HENT:  Head: Normocephalic and atraumatic.  Cardiovascular: Normal rate, regular rhythm and normal heart sounds.  No murmur heard. Pulses:      Dorsalis pedis pulses are 2+ on the right side, and 2+ on the left side.       Posterior tibial pulses are 2+ on the right side, and 2+ on the left side.  Pulmonary/Chest: Effort normal and breath sounds normal. No respiratory distress. She has no wheezes.  Musculoskeletal: She exhibits no edema.  Neurological:  She is alert and oriented to person, place, and time.  Psychiatric: She has a  normal mood and affect. Her behavior is normal. Judgment and thought content normal.          Assessment & Plan:    Hypertension-given her history of chronic renal insufficiency and polycystic kidney disease I would like to try to keep her systolic pressure less than 140.  Unfortunately she is unable to take ACE inhibitors or arms.  We will add Toprol-XL 25 mg once daily to her regimen.  Hyperlipidemia tolerating statin, obtain follow-up lipid panel.  Diabetes type 2-clinically stable, obtain follow-up basic metabolic panel and G3F.  Continue diabetic diet.   Carotid stenosis-this was noted back in 2013 and was mild.  Will repeat carotid study for surveillance purposes.

## 2017-07-05 ENCOUNTER — Telehealth: Payer: Self-pay | Admitting: Family

## 2017-07-05 DIAGNOSIS — E785 Hyperlipidemia, unspecified: Secondary | ICD-10-CM

## 2017-07-05 DIAGNOSIS — E1129 Type 2 diabetes mellitus with other diabetic kidney complication: Secondary | ICD-10-CM

## 2017-07-05 NOTE — Telephone Encounter (Signed)
Looks like she forgot to do her lab work. Please ask her to return for lab work.

## 2017-07-06 ENCOUNTER — Ambulatory Visit (HOSPITAL_BASED_OUTPATIENT_CLINIC_OR_DEPARTMENT_OTHER)
Admission: RE | Admit: 2017-07-06 | Discharge: 2017-07-06 | Disposition: A | Discharge status: Home / Self Care | Payer: Medicare Other | Source: Ambulatory Visit | Attending: Family | Admitting: Family

## 2017-07-06 DIAGNOSIS — I6523 Occlusion and stenosis of bilateral carotid arteries: Secondary | ICD-10-CM | POA: Insufficient documentation

## 2017-07-06 DIAGNOSIS — I6529 Occlusion and stenosis of unspecified carotid artery: Secondary | ICD-10-CM

## 2017-07-15 NOTE — Telephone Encounter (Signed)
Notified pt. She was unaware that labs were needed. I have scheduled lab visit for tomorrow and orders have been re-entered.

## 2017-07-16 ENCOUNTER — Other Ambulatory Visit (INDEPENDENT_AMBULATORY_CARE_PROVIDER_SITE_OTHER): Payer: Medicare Other

## 2017-07-16 DIAGNOSIS — E785 Hyperlipidemia, unspecified: Secondary | ICD-10-CM

## 2017-07-16 DIAGNOSIS — E1129 Type 2 diabetes mellitus with other diabetic kidney complication: Secondary | ICD-10-CM | POA: Diagnosis not present

## 2017-07-16 LAB — HEPATIC FUNCTION PANEL
ALT: 17 U/L (ref 0–35)
AST: 16 U/L (ref 0–37)
Albumin: 3.9 g/dL (ref 3.5–5.2)
Alkaline Phosphatase: 45 U/L (ref 39–117)
BILIRUBIN DIRECT: 0.1 mg/dL (ref 0.0–0.3)
TOTAL PROTEIN: 7.3 g/dL (ref 6.0–8.3)
Total Bilirubin: 0.8 mg/dL (ref 0.2–1.2)

## 2017-07-16 LAB — BASIC METABOLIC PANEL
BUN: 28 mg/dL — AB (ref 6–23)
CHLORIDE: 95 meq/L — AB (ref 96–112)
CO2: 31 mEq/L (ref 19–32)
CREATININE: 1.23 mg/dL — AB (ref 0.40–1.20)
Calcium: 9.3 mg/dL (ref 8.4–10.5)
GFR: 54.6 mL/min — ABNORMAL LOW (ref >60.00)
GLUCOSE: 136 mg/dL — AB (ref 70–99)
POTASSIUM: 4.1 meq/L (ref 3.5–5.1)
Sodium: 132 mEq/L — ABNORMAL LOW (ref 135–145)

## 2017-07-16 LAB — HEMOGLOBIN A1C: HEMOGLOBIN A1C: 7 % — AB (ref 4.6–6.5)

## 2017-07-16 LAB — LIPID PANEL
CHOL/HDL RATIO: 3
CHOLESTEROL: 143 mg/dL (ref 0–200)
HDL: 56.4 mg/dL (ref >39.00)
LDL Cholesterol: 73 mg/dL (ref 0–99)
NonHDL: 86.51
TRIGLYCERIDES: 67 mg/dL (ref 0.0–149.0)
VLDL: 13.4 mg/dL (ref 0.0–40.0)

## 2017-07-17 ENCOUNTER — Other Ambulatory Visit: Payer: Self-pay | Admitting: Family

## 2017-07-17 MED ORDER — METOPROLOL SUCCINATE ER 50 MG PO TB24: 50.0000 mg | ORAL_TABLET | Freq: Every day | ORAL | 0 refills | Status: DC

## 2017-07-17 NOTE — Telephone Encounter (Signed)
Notified pt and she voices understanding. Rx sent to [facility] for 30 day supply and if dose is sufficient at pt's next follow up, we can send new Rx to [facility]. Pt has f/u on 08/03/17 and will keep that.

## 2017-07-17 NOTE — Telephone Encounter (Signed)
Some of her electrolytes are low. Likely due to hctz. I would like her to d/c hctz, and increase toprol xl from 25mg  to 50mg . Follow up in 2 weeks with me. Call if swelling occurs after she stops hctz.

## 2017-07-21 ENCOUNTER — Other Ambulatory Visit: Payer: Self-pay | Admitting: Family

## 2017-08-03 ENCOUNTER — Encounter: Payer: Self-pay | Admitting: Family

## 2017-08-03 ENCOUNTER — Ambulatory Visit (INDEPENDENT_AMBULATORY_CARE_PROVIDER_SITE_OTHER): Payer: Medicare Other | Admitting: Family

## 2017-08-03 VITALS — BP 134/82 | HR 59 | Temp 98.4°F | Resp 16 | Ht 59.0 in | Wt 121.0 lb

## 2017-08-03 DIAGNOSIS — E871 Hypo-osmolality and hyponatremia: Secondary | ICD-10-CM

## 2017-08-03 DIAGNOSIS — I6523 Occlusion and stenosis of bilateral carotid arteries: Secondary | ICD-10-CM | POA: Diagnosis not present

## 2017-08-03 DIAGNOSIS — I10 Essential (primary) hypertension: Secondary | ICD-10-CM

## 2017-08-03 LAB — BASIC METABOLIC PANEL
BUN: 26 mg/dL — ABNORMAL HIGH (ref 6–23)
CHLORIDE: 104 meq/L (ref 96–112)
CO2: 29 mEq/L (ref 19–32)
Calcium: 8.9 mg/dL (ref 8.4–10.5)
Creatinine, Ser: 1.14 mg/dL (ref 0.40–1.20)
GFR: 59.6 mL/min — ABNORMAL LOW (ref >60.00)
Glucose, Bld: 143 mg/dL — ABNORMAL HIGH (ref 70–99)
POTASSIUM: 4.3 meq/L (ref 3.5–5.1)
Sodium: 139 mEq/L (ref 135–145)

## 2017-08-03 NOTE — Patient Instructions (Signed)
Please complete lab work prior to leaving.   

## 2017-08-03 NOTE — Assessment & Plan Note (Signed)
50-69% stenosis in the left internal carotid artery. Plan follow up carotid 11/19, continue statin/aspirin.

## 2017-08-03 NOTE — Progress Notes (Signed)
Subjective:    Patient ID: Brittany Robles, female    DOB: 07-24-1941, 76 y.o.   MRN: [id number]  HPI  Patient is a 76 yr old female who presents today for follow up.  HTN- last visit we add Toprol xl 74m once daily. Denies side effects.  hctz was discontinued due to hyponatremia.   BP Readings from Last 3 Encounters:  08/03/17 134/82  06/30/17 (!) 144/71  05/15/17 (!) 153/99   Carotid stenosis- she recently completed a follow up carotid ultrasound. She was note dto have 50-69% stenosis in the left internal carotid artery. She is on a statin and asprin 830m   Review of Systems    see HPI  Past Medical History:  Diagnosis Date  . Bowel obstruction ([facility]  . Carotid artery stenosis 03/08/2012   Bilateral- 40-50% per duplex 7/13.  Needs follow up duplex in 1 year.   . Carpal tunnel syndrome, bilateral   . Diabetes mellitus    Type 2  . Hypertension   . Osteopenia 04/27/2016   -1.8 femur 9/17  . POLYCYSTIC KIDNEY DISEASE 10/11/2009  . PONV (postoperative nausea and vomiting)   . 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 Spinal stenosis of lumbar region      Social History   Socioeconomic History  . Marital status: Divorced    Spouse name: Not on file  . Number of children: 3  . Years of education: Not on file  . Highest education level: Not on file  Social Needs  . Financial resource strain: Not on file  . Food insecurity - worry: Not on file  . Food insecurity - inability: Not on file  . Transportation needs - medical: Not on file  . Transportation needs - non-medical: Not on file  Occupational History  . Occupation: Retired  Tobacco Use  . Smoking status: Former Smoker    Last attempt to quit: 04/18/2010    Years since quitting: 7.2  . Smokeless tobacco: Never Used  Substance and Sexual Activity  . Alcohol use: Yes    Alcohol/week: 1.2 oz    Types: 2 Shots of liquor per week  . Drug use: No  . Sexual activity: No  Other Topics Concern  . 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 ANTERIOR CERVICAL DECOMPRESSION/DISCECTOMY FUSION 4 LEVELS N/A 03/15/2013   Procedure: Cervical Three-Four Cervical Four-Five Cervical Five-Six Cervical Six-Seven Anterior cervical decompression/diskectomy/fusion;  Surgeon: ErFloyce StakesMD;  [facility];  Service: Neurosurgery;  Laterality: N/A;  Cervical Three-Four Cervical Four-Five Cervical Five-Six Cervical Six-Seven Anterior cervical decompression/diskectomy/fusion  . APPENDECTOMY    . BREAST SURGERY     bx,   neg  . CARPAL TUNNEL RELEASE    . CESAREAN SECTION     x 2  . SMALL INTESTINE SURGERY     for bowel obstruction  . SPINE SURGERY     lumbar spinal stenosis    Family History  Problem Relation Age of Onset  . Cancer Mother        lung  . Cancer Sister        throat  . Cancer Brother        lung  . Cancer Other        Lung  . CAD Son 4889  stent    Allergies  Allergen Reactions  . Shellfish-Derived Products Swelling  . Cozaar [Losartan Potassium] Other (See Comments)    hyperkalemia  . Medrol [Methylprednisolone] Other (See Comments)    Near syncope, low BP  . Ibuprofen Other (See Comments)    Strange feelings and dreams  . Lisinopril Other (See Comments)    Unknown reaction    Current Outpatient Medications on File Prior to Visit  Medication Sig Dispense Refill  . acetaminophen (TYLENOL) 325 MG tablet Take 650 mg by mouth every 6 (six) hours as needed for pain or fever.     Marland Kitchen amLODipine (NORVASC) 10 MG tablet TAKE ONE-HALF TABLET BY  MOUTH DAILY 45 tablet 1  . aspirin EC 81 MG tablet Take 81 mg by mouth daily.    Marland Kitchen atorvastatin (LIPITOR) 10 MG tablet Take 1 tablet (10 mg total) daily by mouth. 90 tablet 1  . Blood Glucose Monitoring Suppl (ONE TOUCH ULTRA MINI) w/Device KIT Use to check blood sugar once  daily. 1 each 0  . Calcium Carbonate-Vitamin D (CALTRATE 600+D) 600-400 MG-UNIT per tablet Take 1 tablet by mouth daily.    . cholecalciferol (VITAMIN D) 1000 units tablet Take 1,000 Units by mouth daily.    . diazepam (VALIUM) 5 MG tablet 1 tab PO 30 minutes prior to procedure - may repeat x1 2 tablet 0  . glucose blood (ONE TOUCH ULTRA TEST) test strip Use as instructed to check blood sugar once a day. 100 each 1  . HYDROcodone-acetaminophen (NORCO) 5-325 MG tablet Take 1 tablet by mouth every 6 (six) hours as needed for moderate pain. 20 tablet 0  . Lancets (ONETOUCH ULTRASOFT) lancets Use as instructed to check blood sugar once a day. 100 each 1  . latanoprost (XALATAN) 0.005 % ophthalmic solution Place 1 drop into both eyes at bedtime.    . metoprolol succinate (TOPROL XL) 50 MG 24 hr tablet Take 1 tablet (50 mg total) by mouth daily. Take with or immediately following a meal. 30 tablet 0  . nystatin ointment (MYCOSTATIN) Apply 1 application topically 2 (two) times daily. (Patient taking differently: Apply 1 application topically 2 (two) times daily as needed. ) 30 g 1  . prednisoLONE acetate (PRED FORTE) 1 % ophthalmic suspension     . valACYclovir (VALTREX) 1000 MG tablet Take 1 tablet (1,000 mg total) by mouth daily as needed. 30 tablet 1   No current facility-administered medications on file prior to visit.     BP 134/82 (BP Location: Right Arm, Patient Position: Sitting, Cuff Size: Small)   Pulse (!) 59   Temp 98.4 F (36.9 C) (Oral)   Resp 16   Ht '4\' 11"'  (1.499 m)   Wt 121 lb (54.9 kg)   LMP 08/19/1983   SpO2 100%   BMI 24.44 kg/m    Objective:   Physical Exam  Constitutional: She is oriented to person, place, and time. She appears well-developed and well-nourished.  HENT:  Head: Normocephalic and atraumatic.  Cardiovascular: Normal rate, regular rhythm and normal heart sounds.  No murmur heard. Pulmonary/Chest: Effort normal and breath sounds normal. No respiratory  distress. She has no wheezes.  Neurological: She is alert and oriented to person, place, and time.  Psychiatric: She has a normal mood and affect. Her behavior is normal. Judgment and thought content normal.          Assessment & Plan:  Hyponatremia- obtain follow up bmet.

## 2017-08-03 NOTE — Assessment & Plan Note (Signed)
BP is stable on current meds.  Continue same.  

## 2017-08-14 ENCOUNTER — Other Ambulatory Visit: Payer: Self-pay | Admitting: Family

## 2017-08-18 ENCOUNTER — Other Ambulatory Visit: Payer: Self-pay | Admitting: Family

## 2017-09-03 ENCOUNTER — Telehealth: Payer: Self-pay | Admitting: Family

## 2017-09-03 NOTE — Telephone Encounter (Signed)
Copied from CRM [id number]. Topic: General - Other >> Sep 03, 2017 10:51 AM Cecelia ByarsGreen, Dannis Deroche L, RMA wrote: Reason for CRM: patient called to notify Sandford CrazeMelissa O'Sullivan that she is having swelling in her left ankle, pt states Efraim Kaufmannmelissa wanted her to call and let her know, pt is requesting a call back to know what she wants her to do next

## 2017-09-04 NOTE — Telephone Encounter (Signed)
Noted  

## 2017-09-04 NOTE — Telephone Encounter (Signed)
Recommend OV please.

## 2017-09-04 NOTE — Telephone Encounter (Signed)
Notified pt and she voices understanding. Reports that the swelling has been intermittent x 1 week and usually around her ankles. Reports swelling increased as the day progresses and is not present when she first wakes up in the mornings. Denies chest pain, sob, long car trips or plane rides. Notes that swelling is worse in her left ankle and hardly present in the right.  Pt only wants to see PCP. Scheduled pt for first available; 09/09/17 at 10:40am and advised pt to seek care earlier of swelling becomes severe, has skin discoloration or feels warm to touch. Pt voices understanding.

## 2017-09-09 ENCOUNTER — Ambulatory Visit (INDEPENDENT_AMBULATORY_CARE_PROVIDER_SITE_OTHER): Payer: Medicare Other | Admitting: Family

## 2017-09-09 ENCOUNTER — Encounter: Payer: Self-pay | Admitting: Family

## 2017-09-09 VITALS — BP 139/69 | HR 56 | Temp 98.5°F | Resp 16 | Ht 59.0 in | Wt 126.4 lb

## 2017-09-09 DIAGNOSIS — R609 Edema, unspecified: Secondary | ICD-10-CM

## 2017-09-09 DIAGNOSIS — I10 Essential (primary) hypertension: Secondary | ICD-10-CM | POA: Diagnosis not present

## 2017-09-09 NOTE — Progress Notes (Signed)
Subjective:    Patient ID: Brittany Robles, female    DOB: [birth year], 77 y.o.   MRN: [id number]  HPI  Patient is a 77 year old female who presents today with chief complaint of lower extremity edema.  She reports that edema began after starting amlodipine.  She reports that swelling is greater in the left ankle than the right.  It improves overnight and worsens as the day progresses.  Review of Systems    see HPI  Past Medical History:  Diagnosis Date  . Bowel obstruction ([place])   . Carotid artery stenosis 03/08/2012   Bilateral- 40-50% per duplex 7/13.  Needs follow up duplex in 1 year.   . Carpal tunnel syndrome, bilateral   . Diabetes mellitus    Type 2  . Hypertension   . Osteopenia 04/27/2016   -1.8 femur 9/17  . POLYCYSTIC KIDNEY DISEASE 10/11/2009  . PONV (postoperative nausea and vomiting)   . 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 Spinal stenosis of lumbar region      Social History   Socioeconomic History  . Marital status: Divorced    Spouse name: Not on file  . Number of children: 3  . Years of education: Not on file  . Highest education level: Not on file  Social Needs  . Financial resource strain: Not on file  . Food insecurity - worry: Not on file  . Food insecurity - inability: Not on file  . Transportation needs - medical: Not on file  . Transportation needs - non-medical: Not on file  Occupational History  . Occupation: Retired  Tobacco Use  . Smoking status: Former Smoker    Last attempt to quit: 04/18/2010    Years since quitting: 7.4  . Smokeless tobacco: Never Used  Substance and Sexual Activity  . Alcohol use: Yes    Alcohol/week: 1.2 oz    Types: 2 Shots of liquor per week  . Drug use: No  . Sexual activity: No  Other Topics Concern  . 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  ANTERIOR CERVICAL DECOMPRESSION/DISCECTOMY FUSION 4 LEVELS N/A 03/15/2013   Procedure: Cervical Three-Four Cervical Four-Five Cervical Five-Six Cervical Six-Seven Anterior cervical decompression/diskectomy/fusion;  Surgeon: Floyce Stakes, MD;  Location: [facility];  Service: Neurosurgery;  Laterality: N/A;  Cervical Three-Four Cervical Four-Five Cervical Five-Six Cervical Six-Seven Anterior cervical decompression/diskectomy/fusion  . APPENDECTOMY    . BREAST SURGERY     bx,   neg  . CARPAL TUNNEL RELEASE    . CESAREAN SECTION     x 2  . SMALL INTESTINE SURGERY     for bowel obstruction  . SPINE SURGERY     lumbar spinal stenosis    Family History  Problem Relation Age of Onset  . Cancer Mother        lung  . Cancer Sister        throat  . Cancer Brother        lung  . Cancer Other        Lung  . CAD Son 36       stent    Allergies  Allergen Reactions  . Shellfish-Derived Products Swelling  . Cozaar [Losartan Potassium] Other (See Comments)    hyperkalemia  . Medrol [Methylprednisolone] Other (See Comments)  Near syncope, low BP  . Ibuprofen Other (See Comments)    Strange feelings and dreams  . Lisinopril Other (See Comments)    Unknown reaction    Current Outpatient Medications on File Prior to Visit  Medication Sig Dispense Refill  . acetaminophen (TYLENOL) 325 MG tablet Take 650 mg by mouth every 6 (six) hours as needed for pain or fever.     Marland Kitchen amLODipine (NORVASC) 10 MG tablet TAKE ONE-HALF TABLET BY  MOUTH DAILY 45 tablet 1  . aspirin EC 81 MG tablet Take 81 mg by mouth daily.    Marland Kitchen atorvastatin (LIPITOR) 10 MG tablet Take 1 tablet (10 mg total) daily by mouth. 90 tablet 1  . Blood Glucose Monitoring Suppl (ONE TOUCH ULTRA MINI) w/Device KIT Use to check blood sugar once daily. 1 each 0  . Calcium Carbonate-Vitamin D (CALTRATE 600+D) 600-400 MG-UNIT per tablet Take 1 tablet by mouth daily.    . cholecalciferol (VITAMIN D) 1000 units tablet Take 1,000 Units  by mouth daily.    . diazepam (VALIUM) 5 MG tablet 1 tab PO 30 minutes prior to procedure - may repeat x1 2 tablet 0  . glucose blood (ONE TOUCH ULTRA TEST) test strip TEST EVERY DAY 1 each 1  . HYDROcodone-acetaminophen (NORCO) 5-325 MG tablet Take 1 tablet by mouth every 6 (six) hours as needed for moderate pain. 20 tablet 0  . Lancets (ONETOUCH ULTRASOFT) lancets TEST EVERY DAY 100 each 1  . latanoprost (XALATAN) 0.005 % ophthalmic solution Place 1 drop into both eyes at bedtime.    . metoprolol succinate (TOPROL-XL) 50 MG 24 hr tablet Take 1 tablet (50 mg total) by mouth daily. Take with or immediately following a meal. 30 tablet 3  . nystatin ointment (MYCOSTATIN) Apply 1 application topically 2 (two) times daily. (Patient taking differently: Apply 1 application topically 2 (two) times daily as needed. ) 30 g 1  . prednisoLONE acetate (PRED FORTE) 1 % ophthalmic suspension     . valACYclovir (VALTREX) 1000 MG tablet Take 1 tablet (1,000 mg total) by mouth daily as needed. 30 tablet 1   No current facility-administered medications on file prior to visit.     BP 139/69 (BP Location: Right Arm, Cuff Size: Normal)   Pulse (!) 56   Temp 98.5 F (36.9 C) (Oral)   Resp 16   Ht '4\' 11"'  (1.499 m)   Wt 126 lb 6.4 oz (57.3 kg)   LMP 08/19/1983   SpO2 99%   BMI 25.53 kg/m    Objective:   Physical Exam  Constitutional: She appears well-developed and well-nourished.  Cardiovascular: Normal rate, regular rhythm and normal heart sounds.  No murmur heard. Pulmonary/Chest: Effort normal and breath sounds normal. No respiratory distress. She has no wheezes.  Musculoskeletal:  Trace bilateral LE edema  Psychiatric: She has a normal mood and affect. Her behavior is normal. Judgment and thought content normal.          Assessment & Plan:  Edema- mild. Likely side effect of amlodipine. She is not bothered by the edema. No other sign of volume overload.  HTN- stable on amlodipine/beta  blocker. Continue same.

## 2017-09-09 NOTE — Patient Instructions (Signed)
Continue current meds 

## 2017-09-11 ENCOUNTER — Ambulatory Visit: Payer: Medicare Other | Admitting: Family Medicine

## 2017-09-11 ENCOUNTER — Encounter: Payer: Self-pay | Admitting: Family Medicine

## 2017-09-11 VITALS — BP 173/84 | HR 71 | Ht 59.0 in | Wt 122.0 lb

## 2017-09-11 DIAGNOSIS — M25512 Pain in left shoulder: Secondary | ICD-10-CM | POA: Diagnosis not present

## 2017-09-11 DIAGNOSIS — G8929 Other chronic pain: Secondary | ICD-10-CM

## 2017-09-11 DIAGNOSIS — M25511 Pain in right shoulder: Secondary | ICD-10-CM

## 2017-09-11 MED ORDER — METHYLPREDNISOLONE ACETATE 40 MG/ML IJ SUSP
40.0000 mg | Freq: Once | INTRAMUSCULAR | Status: AC
  Administered 2017-09-11: 40 mg via INTRA_ARTICULAR

## 2017-09-11 NOTE — Patient Instructions (Signed)
You have rotator cuff impingement of your right shoulder, known tears of your left shoulder. Try to avoid painful activities (overhead activities, lifting with extended arm) as much as possible. Tylenol, tramadol as needed for pain. You were given subacromial injections today. Consider physical therapy with transition to home exercise program. Motion exercises (arm circles, swings, table slides) 3 sets of 10 once a day. When tolerated do home exercise program with theraband and scapular stabilization exercises daily 3 sets of 10 once a day (yellow theraband). If not improving at follow-up we will consider imaging, physical therapy. Follow up with me in 6 weeks.

## 2017-09-13 ENCOUNTER — Encounter: Payer: Self-pay | Admitting: Family Medicine

## 2017-09-13 NOTE — Assessment & Plan Note (Signed)
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  Tylenol, tramadol as needed.  Subacromial injections given bilaterally.  Reviewed motion exercises for both, strengthening with yellow theraband for right.  Consider PT, imaging of right shoulder.  F/u in 6 weeks.  After informed written consent timeout was performed, patient was seated on exam table. Right shoulder was prepped with alcohol swab and utilizing posterior approach, patient's right subacromial space was injected with 3:1 bupivicaine: depomedrol. Patient tolerated the procedure well without immediate complications.  After informed written consent timeout was performed, patient was seated on exam table. Left shoulder was prepped with alcohol swab and utilizing posterior approach, patient's left subacromial space was injected with 3:1 bupivicaine: depomedrol. Patient tolerated the procedure well without immediate complications.

## 2017-09-13 NOTE — Progress Notes (Signed)
PCP: Debbrah Alar, NP  Subjective:   HPI: Patient is a 77 y.o. female here for right shoulder pain.  Patient reports having several months of worsening right shoulder pain to 10/10 level, sharp, and lateral. Still having problems with left shoulder too where she has irreparable rotator cuff tears. No benefit with patches though reports used them less than 6 weeks. Pain is worse at night, with dressing. No skin changes, numbness. Taking tramadol. Right handed.  Past Medical History:  Diagnosis Date  . Bowel obstruction ([place])   . Carotid artery stenosis 03/08/2012   Bilateral- 40-50% per duplex 7/13.  Needs follow up duplex in 1 year.   . Carpal tunnel syndrome, bilateral   . Diabetes mellitus    Type 2  . Hypertension   . Osteopenia 04/27/2016   -1.8 femur 9/17  . POLYCYSTIC KIDNEY DISEASE 10/11/2009  . PONV (postoperative nausea and vomiting)   . 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 Spinal stenosis of lumbar region     Current Outpatient Medications on File Prior to Visit  Medication Sig Dispense Refill  . acetaminophen (TYLENOL) 325 MG tablet Take 650 mg by mouth every 6 (six) hours as needed for pain or fever.     Marland Kitchen amLODipine (NORVASC) 10 MG tablet TAKE ONE-HALF TABLET BY  MOUTH DAILY 45 tablet 1  . aspirin EC 81 MG tablet Take 81 mg by mouth daily.    Marland Kitchen atorvastatin (LIPITOR) 10 MG tablet Take 1 tablet (10 mg total) daily by mouth. 90 tablet 1  . Blood Glucose Monitoring Suppl (ONE TOUCH ULTRA MINI) w/Device KIT Use to check blood sugar once daily. 1 each 0  . Calcium Carbonate-Vitamin D (CALTRATE 600+D) 600-400 MG-UNIT per tablet Take 1 tablet by mouth daily.    . cholecalciferol (VITAMIN D) 1000 units tablet Take 1,000 Units by mouth daily.    . diazepam (VALIUM) 5 MG tablet 1 tab PO 30 minutes prior to procedure - may repeat x1 2 tablet 0  . glucose blood (ONE TOUCH ULTRA TEST) test strip TEST EVERY DAY 1 each 1  . HYDROcodone-acetaminophen (NORCO) 5-325 MG  tablet Take 1 tablet by mouth every 6 (six) hours as needed for moderate pain. 20 tablet 0  . Lancets (ONETOUCH ULTRASOFT) lancets TEST EVERY DAY 100 each 1  . latanoprost (XALATAN) 0.005 % ophthalmic solution Place 1 drop into both eyes at bedtime.    . metoprolol succinate (TOPROL-XL) 50 MG 24 hr tablet Take 1 tablet (50 mg total) by mouth daily. Take with or immediately following a meal. 30 tablet 3  . nystatin ointment (MYCOSTATIN) Apply 1 application topically 2 (two) times daily. (Patient taking differently: Apply 1 application topically 2 (two) times daily as needed. ) 30 g 1  . prednisoLONE acetate (PRED FORTE) 1 % ophthalmic suspension     . valACYclovir (VALTREX) 1000 MG tablet Take 1 tablet (1,000 mg total) by mouth daily as needed. 30 tablet 1   No current facility-administered medications on file prior to visit.     Past Surgical History:  Procedure Laterality Date  . ANTERIOR CERVICAL DECOMPRESSION/DISCECTOMY FUSION 4 LEVELS N/A 03/15/2013   Procedure: Cervical Three-Four Cervical Four-Five Cervical Five-Six Cervical Six-Seven Anterior cervical decompression/diskectomy/fusion;  Surgeon: Floyce Stakes, MD;  Location: [facility];  Service: Neurosurgery;  Laterality: N/A;  Cervical Three-Four Cervical Four-Five Cervical Five-Six Cervical Six-Seven Anterior cervical decompression/diskectomy/fusion  . APPENDECTOMY    . BREAST SURGERY     bx,   neg  . CARPAL  TUNNEL RELEASE    . CESAREAN SECTION     x 2  . SMALL INTESTINE SURGERY     for bowel obstruction  . SPINE SURGERY     lumbar spinal stenosis    Allergies  Allergen Reactions  . Shellfish-Derived Products Swelling  . Cozaar [Losartan Potassium] Other (See Comments)    hyperkalemia  . Medrol [Methylprednisolone] Other (See Comments)    Near syncope, low BP  . Ibuprofen Other (See Comments)    Strange feelings and dreams  . Lisinopril Other (See Comments)    Unknown reaction    Social History   Socioeconomic  History  . Marital status: Divorced    Spouse name: Not on file  . Number of children: 3  . Years of education: Not on file  . Highest education level: Not on file  Social Needs  . Financial resource strain: Not on file  . Food insecurity - worry: Not on file  . Food insecurity - inability: Not on file  . Transportation needs - medical: Not on file  . Transportation needs - non-medical: Not on file  Occupational History  . Occupation: Retired  Tobacco Use  . Smoking status: Former Smoker    Last attempt to quit: 04/18/2010    Years since quitting: 7.4  . Smokeless tobacco: Never Used  Substance and Sexual Activity  . Alcohol use: Yes    Alcohol/week: 1.2 oz    Types: 2 Shots of liquor per week  . Drug use: No  . Sexual activity: No  Other Topics Concern  . 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 Cancer Mother        lung  . Cancer Sister        throat  . Cancer Brother        lung  . Cancer Other        Lung  . CAD Son 46       stent    BP (!) 173/84   Pulse 71   Ht '4\' 11"'  (1.499 m)   Wt 122 lb (55.3 kg)   LMP 08/19/1983   BMI 24.64 kg/m   Review of Systems: See HPI above.     Objective:  Physical Exam:  Gen: NAD, comfortable in exam room  Right shoulder: No swelling, ecchymoses.  No gross deformity. No TTP. FROM passively.  Actively can get to 90 degrees abduction and flexion with pain.  Full ER and IR without pain. Positive Hawkins, Neers. Negative Yergasons. Strength 5/5 with empty can and resisted internal/external rotation. Negative apprehension. NV intact distally.  Left shoulder: No swelling, ecchymoses.  No gross deformity. No TTP AC joint, biceps tendon. 45 degrees abduction and flexion, Full ER.  All motions painful. Positive Hawkins, Neers. Negative Yergasons. Strength 2/5 with  empty can and resisted ER.  5/5 IR. NV intact distally.   Assessment & Plan:  1. 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  Tylenol, tramadol as needed.  Subacromial injections given bilaterally.  Reviewed motion exercises for both, strengthening with yellow theraband for right.  Consider PT, imaging of right shoulder.  F/u in 6 weeks.  After informed written consent timeout was performed, patient was seated on exam table. Right  shoulder was prepped with alcohol swab and utilizing posterior approach, patient's right subacromial space was injected with 3:1 bupivicaine: depomedrol. Patient tolerated the procedure well without immediate complications.  After informed written consent timeout was performed, patient was seated on exam table. Left shoulder was prepped with alcohol swab and utilizing posterior approach, patient's left subacromial space was injected with 3:1 bupivicaine: depomedrol. Patient tolerated the procedure well without immediate complications.

## 2017-11-02 ENCOUNTER — Ambulatory Visit: Payer: Medicare Other | Admitting: Family

## 2017-11-02 DIAGNOSIS — Z0289 Encounter for other administrative examinations: Secondary | ICD-10-CM

## 2017-11-06 ENCOUNTER — Ambulatory Visit: Payer: Self-pay

## 2017-11-06 ENCOUNTER — Ambulatory Visit (INDEPENDENT_AMBULATORY_CARE_PROVIDER_SITE_OTHER): Payer: Medicare Other | Admitting: Family

## 2017-11-06 ENCOUNTER — Encounter: Payer: Self-pay | Admitting: Family

## 2017-11-06 VITALS — BP 133/63 | HR 64 | Temp 98.3°F | Resp 16 | Ht 59.0 in | Wt 124.6 lb

## 2017-11-06 DIAGNOSIS — R51 Headache: Secondary | ICD-10-CM | POA: Diagnosis not present

## 2017-11-06 DIAGNOSIS — R519 Headache, unspecified: Secondary | ICD-10-CM

## 2017-11-06 MED ORDER — NYSTATIN 100000 UNIT/GM EX POWD: Freq: Two times a day (BID) | CUTANEOUS | 1 refills | Status: DC | PRN

## 2017-11-06 NOTE — Telephone Encounter (Signed)
FYI to PCP. Appt later today.

## 2017-11-06 NOTE — Progress Notes (Signed)
Subjective:    Patient ID: Brittany Robles, female    DOB: 06/06/1941, 77 y.o.   MRN: [id number]  HPI  Patient is a 77 year old female who presents today with chief complaint of facial pain.  She reports that she had facial pain in the left side which began last night.  She had trouble sleeping due to the pain.  She reports that she took some Tylenol.  She reports significant improvement in the pain this morning.  She denies rash. Review of Systems   see HPI  Past Medical History:  Diagnosis Date  . Bowel obstruction ([place])   . Carotid artery stenosis 03/08/2012   Bilateral- 40-50% per duplex 7/13.  Needs follow up duplex in 1 year.   . Carpal tunnel syndrome, bilateral   . Diabetes mellitus    Type 2  . Hypertension   . Osteopenia 04/27/2016   -1.8 femur 9/17  . POLYCYSTIC KIDNEY DISEASE 10/11/2009  . PONV (postoperative nausea and vomiting)   . 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 Spinal stenosis of lumbar region      Social History   Socioeconomic History  . Marital status: Divorced    Spouse name: Not on file  . Number of children: 3  . Years of education: Not on file  . Highest education level: Not on file  Occupational History  . Occupation: Retired  Scientific laboratory technician  . Financial resource strain: Not on file  . Food insecurity:    Worry: Not on file    Inability: Not on file  . Transportation needs:    Medical: Not on file    Non-medical: Not on file  Tobacco Use  . Smoking status: Former Smoker    Last attempt to quit: 04/18/2010    Years since quitting: 7.5  . Smokeless tobacco: Never Used  Substance and Sexual Activity  . Alcohol use: Yes    Alcohol/week: 1.2 oz    Types: 2 Shots of liquor per week  . Drug use: No  . Sexual activity: Never  Lifestyle  . Physical activity:    Days per week: Not on file    Minutes per session: Not on file  . Stress: Not on file  Relationships  . Social connections:    Talks on phone: Not on file    Gets together: Not  on file    Attends religious service: Not on file    Active member of club or organization: Not on file    Attends meetings of clubs or organizations: Not on file    Relationship status: Not on file  . Intimate partner violence:    Fear of current or ex partner: Not on file    Emotionally abused: Not on file    Physically abused: Not on file    Forced sexual activity: Not on file  Other Topics Concern  . 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 ANTERIOR CERVICAL DECOMPRESSION/DISCECTOMY FUSION 4 LEVELS N/A 03/15/2013   Procedure: Cervical Three-Four Cervical Four-Five Cervical Five-Six Cervical Six-Seven Anterior cervical decompression/diskectomy/fusion;  Surgeon: Floyce Stakes, MD;  Location: [facility];  Service: Neurosurgery;  Laterality: N/A;  Cervical Three-Four Cervical Four-Five Cervical Five-Six Cervical Six-Seven Anterior cervical decompression/diskectomy/fusion  . APPENDECTOMY    . BREAST SURGERY     bx,  neg  . CARPAL TUNNEL RELEASE    . CESAREAN SECTION     x 2  . SMALL INTESTINE SURGERY     for bowel obstruction  . SPINE SURGERY     lumbar spinal stenosis    Family History  Problem Relation Age of Onset  . Cancer Mother        lung  . Cancer Sister        throat  . Cancer Brother        lung  . Cancer Other        Lung  . CAD Son 69       stent    Allergies  Allergen Reactions  . Shellfish-Derived Products Swelling  . Cozaar [Losartan Potassium] Other (See Comments)    hyperkalemia  . Medrol [Methylprednisolone] Other (See Comments)    Near syncope, low BP  . Ibuprofen Other (See Comments)    Strange feelings and dreams  . Lisinopril Other (See Comments)    Unknown reaction    Current Outpatient Medications on File Prior to Visit  Medication Sig Dispense Refill  . acetaminophen  (TYLENOL) 325 MG tablet Take 650 mg by mouth every 6 (six) hours as needed for pain or fever.     Marland Kitchen amLODipine (NORVASC) 10 MG tablet TAKE ONE-HALF TABLET BY  MOUTH DAILY 45 tablet 1  . aspirin EC 81 MG tablet Take 81 mg by mouth daily.    Marland Kitchen atorvastatin (LIPITOR) 10 MG tablet Take 1 tablet (10 mg total) daily by mouth. 90 tablet 1  . Blood Glucose Monitoring Suppl (ONE TOUCH ULTRA MINI) w/Device KIT Use to check blood sugar once daily. 1 each 0  . Calcium Carbonate-Vitamin D (CALTRATE 600+D) 600-400 MG-UNIT per tablet Take 1 tablet by mouth daily.    . cholecalciferol (VITAMIN D) 1000 units tablet Take 1,000 Units by mouth daily.    . diazepam (VALIUM) 5 MG tablet 1 tab PO 30 minutes prior to procedure - may repeat x1 2 tablet 0  . glucose blood (ONE TOUCH ULTRA TEST) test strip TEST EVERY DAY 1 each 1  . HYDROcodone-acetaminophen (NORCO) 5-325 MG tablet Take 1 tablet by mouth every 6 (six) hours as needed for moderate pain. 20 tablet 0  . Lancets (ONETOUCH ULTRASOFT) lancets TEST EVERY DAY 100 each 1  . latanoprost (XALATAN) 0.005 % ophthalmic solution Place 1 drop into both eyes at bedtime.    . metoprolol succinate (TOPROL-XL) 50 MG 24 hr tablet Take 1 tablet (50 mg total) by mouth daily. Take with or immediately following a meal. 30 tablet 3  . prednisoLONE acetate (PRED FORTE) 1 % ophthalmic suspension     . valACYclovir (VALTREX) 1000 MG tablet Take 1 tablet (1,000 mg total) by mouth daily as needed. 30 tablet 1   No current facility-administered medications on file prior to visit.     BP 133/63 (BP Location: Right Arm, Patient Position: Sitting, Cuff Size: Small)   Pulse 64   Temp 98.3 F (36.8 C) (Oral)   Resp 16   Ht '4\' 11"'  (1.499 m)   Wt 124 lb 9.6 oz (56.5 kg)   LMP 08/19/1983   SpO2 100%   BMI 25.17 kg/m    Objective:   Physical Exam  Constitutional: She is oriented to person, place, and time. She appears well-developed and well-nourished.  Cardiovascular: Normal rate,  regular rhythm and normal heart sounds.  No murmur heard. Pulmonary/Chest: Effort normal and breath sounds normal. No respiratory distress. She  has no wheezes.  Neurological: She is alert and oriented to person, place, and time.  Skin: No rash noted.  Psychiatric: She has a normal mood and affect. Her behavior is normal. Judgment and thought content normal.          Assessment & Plan:  Facial pain-no sign of rash today.  We discussed that her pain could be trigeminal neuralgia or an early herpes zoster infection which has not yet blistered.  As her pain is improved at this time we will continue to monitor.  I have asked her let me know if recurrent pain or if rash occurs.  Patient verbalizes understanding.

## 2017-11-06 NOTE — Patient Instructions (Signed)
Call if recurrent/worsening facial pain.

## 2017-11-06 NOTE — Telephone Encounter (Signed)
  Reason for Disposition . Face pain present > 24 hours  Answer Assessment - Initial Assessment Questions 1. ONSET: "When did the pain start?" (e.g., minutes, hours, days)     Last night 2. ONSET: "Does the pain come and go, or has it been constant since it started?" (e.g., constant, intermittent, fleeting)     Constant - did not sleep 3. SEVERITY: "How bad is the pain?"   (Scale 1-10; mild, moderate or severe)   - MILD (1-3): doesn't interfere with normal activities    - MODERATE (4-7): interferes with normal activities or awakens from sleep    - SEVERE (8-10): excruciating pain, unable to do any normal activities      5 4. LOCATION: "Where does it hurt?"      Eye and cheek 5. RASH: "Is there any redness, rash, or swelling of the face?"     No 6. FEVER: "Do you have a fever?" If so, ask: "What is it, how was it measured, and when did it start?"      No 7. OTHER SYMPTOMS: "Do you have any other symptoms?" (e.g., fever, toothache, nasal discharge, nasal congestion, clicking sensation in jaw joint)     No 8. PREGNANCY: "Is there any chance you are pregnant?" "When was your last menstrual period?"     No  Protocols used: FACE PAIN-A-AH

## 2017-11-17 ENCOUNTER — Encounter: Payer: Self-pay | Admitting: Family

## 2017-11-21 ENCOUNTER — Encounter (HOSPITAL_BASED_OUTPATIENT_CLINIC_OR_DEPARTMENT_OTHER): Payer: Self-pay | Admitting: Emergency Medicine

## 2017-11-21 ENCOUNTER — Emergency Department (HOSPITAL_BASED_OUTPATIENT_CLINIC_OR_DEPARTMENT_OTHER)
Admission: EM | Admit: 2017-11-21 | Discharge: 2017-11-21 | Disposition: A | Discharge status: Home / Self Care | Payer: Medicare Other | Attending: Emergency Medicine | Admitting: Emergency Medicine

## 2017-11-21 ENCOUNTER — Emergency Department (HOSPITAL_BASED_OUTPATIENT_CLINIC_OR_DEPARTMENT_OTHER): Payer: Medicare Other

## 2017-11-21 ENCOUNTER — Other Ambulatory Visit: Payer: Self-pay

## 2017-11-21 DIAGNOSIS — Z79899 Other long term (current) drug therapy: Secondary | ICD-10-CM | POA: Insufficient documentation

## 2017-11-21 DIAGNOSIS — R1013 Epigastric pain: Secondary | ICD-10-CM | POA: Diagnosis not present

## 2017-11-21 DIAGNOSIS — E785 Hyperlipidemia, unspecified: Secondary | ICD-10-CM | POA: Diagnosis not present

## 2017-11-21 DIAGNOSIS — Y998 Other external cause status: Secondary | ICD-10-CM | POA: Diagnosis not present

## 2017-11-21 DIAGNOSIS — R1012 Left upper quadrant pain: Secondary | ICD-10-CM | POA: Diagnosis not present

## 2017-11-21 DIAGNOSIS — Y92410 Unspecified street and highway as the place of occurrence of the external cause: Secondary | ICD-10-CM | POA: Insufficient documentation

## 2017-11-21 DIAGNOSIS — S299XXA Unspecified injury of thorax, initial encounter: Secondary | ICD-10-CM | POA: Diagnosis not present

## 2017-11-21 DIAGNOSIS — I10 Essential (primary) hypertension: Secondary | ICD-10-CM | POA: Diagnosis not present

## 2017-11-21 DIAGNOSIS — R51 Headache: Secondary | ICD-10-CM | POA: Diagnosis not present

## 2017-11-21 DIAGNOSIS — Z7982 Long term (current) use of aspirin: Secondary | ICD-10-CM | POA: Diagnosis not present

## 2017-11-21 DIAGNOSIS — E119 Type 2 diabetes mellitus without complications: Secondary | ICD-10-CM | POA: Insufficient documentation

## 2017-11-21 DIAGNOSIS — R1011 Right upper quadrant pain: Secondary | ICD-10-CM | POA: Diagnosis not present

## 2017-11-21 DIAGNOSIS — S0990XA Unspecified injury of head, initial encounter: Secondary | ICD-10-CM | POA: Diagnosis not present

## 2017-11-21 DIAGNOSIS — M542 Cervicalgia: Secondary | ICD-10-CM | POA: Diagnosis not present

## 2017-11-21 DIAGNOSIS — R109 Unspecified abdominal pain: Secondary | ICD-10-CM | POA: Diagnosis not present

## 2017-11-21 DIAGNOSIS — Y939 Activity, unspecified: Secondary | ICD-10-CM | POA: Insufficient documentation

## 2017-11-21 DIAGNOSIS — M549 Dorsalgia, unspecified: Secondary | ICD-10-CM | POA: Diagnosis not present

## 2017-11-21 DIAGNOSIS — Z87891 Personal history of nicotine dependence: Secondary | ICD-10-CM | POA: Insufficient documentation

## 2017-11-21 DIAGNOSIS — S3992XA Unspecified injury of lower back, initial encounter: Secondary | ICD-10-CM | POA: Diagnosis not present

## 2017-11-21 DIAGNOSIS — R079 Chest pain, unspecified: Secondary | ICD-10-CM | POA: Diagnosis not present

## 2017-11-21 DIAGNOSIS — S199XXA Unspecified injury of neck, initial encounter: Secondary | ICD-10-CM | POA: Diagnosis not present

## 2017-11-21 LAB — COMPREHENSIVE METABOLIC PANEL
ALBUMIN: 4.2 g/dL (ref 3.5–5.0)
ALT: 17 U/L (ref 14–54)
ANION GAP: 9 (ref 5–15)
AST: 24 U/L (ref 15–41)
Alkaline Phosphatase: 47 U/L (ref 38–126)
BUN: 33 mg/dL — AB (ref 6–20)
CHLORIDE: 107 mmol/L (ref 101–111)
CO2: 24 mmol/L (ref 22–32)
Calcium: 9.1 mg/dL (ref 8.9–10.3)
Creatinine, Ser: 1.17 mg/dL — ABNORMAL HIGH (ref 0.44–1.00)
GFR calc Af Amer: 51 mL/min — ABNORMAL LOW (ref >60)
GFR calc non Af Amer: 44 mL/min — ABNORMAL LOW (ref >60)
GLUCOSE: 124 mg/dL — AB (ref 65–99)
POTASSIUM: 4.2 mmol/L (ref 3.5–5.1)
SODIUM: 140 mmol/L (ref 135–145)
TOTAL PROTEIN: 7.8 g/dL (ref 6.5–8.1)
Total Bilirubin: 0.4 mg/dL (ref 0.3–1.2)

## 2017-11-21 LAB — CBC WITH DIFFERENTIAL/PLATELET
BASOS ABS: 0 10*3/uL (ref 0.0–0.1)
BASOS PCT: 0 %
EOS ABS: 0.1 10*3/uL (ref 0.0–0.7)
EOS PCT: 1 %
HEMATOCRIT: 38.5 % (ref 36.0–46.0)
Hemoglobin: 13 g/dL (ref 12.0–15.0)
Lymphocytes Relative: 24 %
Lymphs Abs: 1.9 10*3/uL (ref 0.7–4.0)
MCH: 31 pg (ref 26.0–34.0)
MCHC: 33.8 g/dL (ref 30.0–36.0)
MCV: 91.9 fL (ref 78.0–100.0)
MONO ABS: 0.6 10*3/uL (ref 0.1–1.0)
MONOS PCT: 7 %
NEUTROS ABS: 5.4 10*3/uL (ref 1.7–7.7)
Neutrophils Relative %: 68 %
PLATELETS: 332 10*3/uL (ref 150–400)
RBC: 4.19 MIL/uL (ref 3.87–5.11)
RDW: 13.5 % (ref 11.5–15.5)
WBC: 8 10*3/uL (ref 4.0–10.5)

## 2017-11-21 LAB — URINALYSIS, ROUTINE W REFLEX MICROSCOPIC
Bilirubin Urine: NEGATIVE
GLUCOSE, UA: NEGATIVE mg/dL
Hgb urine dipstick: NEGATIVE
Ketones, ur: NEGATIVE mg/dL
LEUKOCYTES UA: NEGATIVE
NITRITE: NEGATIVE
PH: 5.5 (ref 5.0–8.0)
Protein, ur: NEGATIVE mg/dL
Specific Gravity, Urine: 1.02 (ref 1.005–1.030)

## 2017-11-21 LAB — LIPASE, BLOOD: Lipase: 43 U/L (ref 11–51)

## 2017-11-21 MED ORDER — IOPAMIDOL (ISOVUE-300) INJECTION 61%
100.0000 mL | Freq: Once | INTRAVENOUS | Status: AC | PRN
  Administered 2017-11-21: 100 mL via INTRAVENOUS

## 2017-11-21 MED ORDER — ACETAMINOPHEN 325 MG PO TABS
650.0000 mg | ORAL_TABLET | Freq: Once | ORAL | Status: AC
  Administered 2017-11-21: 650 mg via ORAL
  Filled 2017-11-21: qty 2

## 2017-11-21 NOTE — ED Notes (Signed)
CT will need to wait for bun/creat/egfr to result prior to imaging with IV contrast per [place] radiology protocol, pt >60 yrs of age, hx DM, hx polycystic kidney disease, with hx elevated creat. In past
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
## 2017-11-21 NOTE — ED Notes (Signed)
Pt in CT.
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
## 2017-11-22 LAB — URINE CULTURE

## 2017-11-26 ENCOUNTER — Ambulatory Visit: Payer: Medicare Other | Admitting: Medical

## 2017-12-07 ENCOUNTER — Other Ambulatory Visit: Payer: Self-pay | Admitting: Family

## 2017-12-22 ENCOUNTER — Other Ambulatory Visit: Payer: Self-pay | Admitting: Family

## 2017-12-28 DIAGNOSIS — H401131 Primary open-angle glaucoma, bilateral, mild stage: Secondary | ICD-10-CM | POA: Diagnosis not present

## 2017-12-28 DIAGNOSIS — H40043 Steroid responder, bilateral: Secondary | ICD-10-CM | POA: Diagnosis not present

## 2018-04-01 ENCOUNTER — Ambulatory Visit (INDEPENDENT_AMBULATORY_CARE_PROVIDER_SITE_OTHER): Payer: Medicare Other | Admitting: Family Medicine

## 2018-04-01 ENCOUNTER — Encounter: Payer: Self-pay | Admitting: Family Medicine

## 2018-04-01 VITALS — BP 145/76 | HR 69 | Ht 59.0 in | Wt 124.0 lb

## 2018-04-01 DIAGNOSIS — M25512 Pain in left shoulder: Secondary | ICD-10-CM

## 2018-04-01 DIAGNOSIS — M25511 Pain in right shoulder: Secondary | ICD-10-CM | POA: Diagnosis not present

## 2018-04-01 DIAGNOSIS — G8929 Other chronic pain: Secondary | ICD-10-CM | POA: Diagnosis not present

## 2018-04-01 MED ORDER — METHYLPREDNISOLONE ACETATE 40 MG/ML IJ SUSP
40.0000 mg | Freq: Once | INTRAMUSCULAR | Status: AC
  Administered 2018-04-01: 40 mg via INTRA_ARTICULAR

## 2018-04-01 NOTE — Progress Notes (Signed)
PCP: Debbrah Alar, NP  Subjective:   HPI: Patient is a 77 y.o. female here for right > left shoulder pain.  1/25: Patient reports having several months of worsening right shoulder pain to 10/10 level, sharp, and lateral. Still having problems with left shoulder too where she has irreparable rotator cuff tears. No benefit with patches though reports used them less than 6 weeks. Pain is worse at night, with dressing. No skin changes, numbness. Taking tramadol. Right handed.  8/15: Patient reports injection for right shoulder helped for a couple months. Pain is 10/10 right lateral shoulder, 8/10 left lateral shoulder and sharp, worse trying overhead motions and reaching. Has tried icy hot. Past couple days have been bad. No numbness. No new injuries.  Past Medical History:  Diagnosis Date  . Bowel obstruction ([place])   . Carotid artery stenosis 03/08/2012   Bilateral- 40-50% per duplex 7/13.  Needs follow up duplex in 1 year.   . Carpal tunnel syndrome, bilateral   . Diabetes mellitus    Type 2  . Hypertension   . Osteopenia 04/27/2016   -1.8 femur 9/17  . POLYCYSTIC KIDNEY DISEASE 10/11/2009  . PONV (postoperative nausea and vomiting)   . 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 Spinal stenosis of lumbar region     Current Outpatient Medications on File Prior to Visit  Medication Sig Dispense Refill  . acetaminophen (TYLENOL) 325 MG tablet Take 650 mg by mouth every 6 (six) hours as needed for pain or fever.     Marland Kitchen amLODipine (NORVASC) 10 MG tablet TAKE ONE-HALF TABLET BY  MOUTH DAILY 45 tablet 1  . aspirin EC 81 MG tablet Take 81 mg by mouth daily.    Marland Kitchen atorvastatin (LIPITOR) 10 MG tablet TAKE 1 TABLET BY MOUTH  DAILY 90 tablet 1  . Blood Glucose Monitoring Suppl (ONE TOUCH ULTRA MINI) w/Device KIT Use to check blood sugar once daily. 1 each 0  . Calcium Carbonate-Vitamin D (CALTRATE 600+D) 600-400 MG-UNIT per tablet Take 1 tablet by mouth daily.    . cholecalciferol  (VITAMIN D) 1000 units tablet Take 1,000 Units by mouth daily.    . diazepam (VALIUM) 5 MG tablet 1 tab PO 30 minutes prior to procedure - may repeat x1 2 tablet 0  . glucose blood (ONE TOUCH ULTRA TEST) test strip TEST EVERY DAY 1 each 1  . HYDROcodone-acetaminophen (NORCO) 5-325 MG tablet Take 1 tablet by mouth every 6 (six) hours as needed for moderate pain. 20 tablet 0  . Lancets (ONETOUCH ULTRASOFT) lancets TEST EVERY DAY 100 each 1  . latanoprost (XALATAN) 0.005 % ophthalmic solution Place 1 drop into both eyes at bedtime.    . metoprolol succinate (TOPROL-XL) 50 MG 24 hr tablet TAKE 1 TABLET (50 MG TOTAL) BY MOUTH DAILY. TAKE WITH OR IMMEDIATELY FOLLOWING A MEAL. 30 tablet 3  . nystatin (MYCOSTATIN/NYSTOP) powder Apply topically 2 (two) times daily as needed. 45 g 1  . prednisoLONE acetate (PRED FORTE) 1 % ophthalmic suspension     . valACYclovir (VALTREX) 1000 MG tablet Take 1 tablet (1,000 mg total) by mouth daily as needed. 30 tablet 1   No current facility-administered medications on file prior to visit.     Past Surgical History:  Procedure Laterality Date  . ANTERIOR CERVICAL DECOMPRESSION/DISCECTOMY FUSION 4 LEVELS N/A 03/15/2013   Procedure: Cervical Three-Four Cervical Four-Five Cervical Five-Six Cervical Six-Seven Anterior cervical decompression/diskectomy/fusion;  Surgeon: Floyce Stakes, MD;  Location: [facility];  Service: Neurosurgery;  Laterality:  N/A;  Cervical Three-Four Cervical Four-Five Cervical Five-Six Cervical Six-Seven Anterior cervical decompression/diskectomy/fusion  . APPENDECTOMY    . BREAST SURGERY     bx,   neg  . CARPAL TUNNEL RELEASE    . CESAREAN SECTION     x 2  . SMALL INTESTINE SURGERY     for bowel obstruction  . SPINE SURGERY     lumbar spinal stenosis    Allergies  Allergen Reactions  . Shellfish-Derived Products Swelling  . Cozaar [Losartan Potassium] Other (See Comments)    hyperkalemia  . Medrol [Methylprednisolone] Other (See  Comments)    Near syncope, low BP  . Ibuprofen Other (See Comments)    Strange feelings and dreams  . Lisinopril Other (See Comments)    Unknown reaction    Social History   Socioeconomic History  . Marital status: Divorced    Spouse name: Not on file  . Number of children: 3  . Years of education: Not on file  . Highest education level: Not on file  Occupational History  . Occupation: Retired  Scientific laboratory technician  . Financial resource strain: Not on file  . Food insecurity:    Worry: Not on file    Inability: Not on file  . Transportation needs:    Medical: Not on file    Non-medical: Not on file  Tobacco Use  . Smoking status: Former Smoker    Last attempt to quit: 04/18/2010    Years since quitting: 7.9  . Smokeless tobacco: Never Used  Substance and Sexual Activity  . Alcohol use: Yes    Alcohol/week: 2.0 standard drinks    Types: 2 Shots of liquor per week  . Drug use: No  . Sexual activity: Never  Lifestyle  . Physical activity:    Days per week: Not on file    Minutes per session: Not on file  . Stress: Not on file  Relationships  . Social connections:    Talks on phone: Not on file    Gets together: Not on file    Attends religious service: Not on file    Active member of club or organization: Not on file    Attends meetings of clubs or organizations: Not on file    Relationship status: Not on file  . Intimate partner violence:    Fear of current or ex partner: Not on file    Emotionally abused: Not on file    Physically abused: Not on file    Forced sexual activity: Not on file  Other Topics Concern  . 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 Cancer Mother        lung  . Cancer Sister        throat  . Cancer Brother        lung  . Cancer Other        Lung  . CAD Son 53       stent    BP (!)  145/76   Pulse 69   Ht '4\' 11"'  (1.499 m)   Wt 124 lb (56.2 kg)   LMP 08/19/1983   BMI 25.04 kg/m   Review of Systems: See HPI above.     Objective:  Physical Exam:  Gen: NAD, comfortable in exam room  Right  shoulder: No swelling, ecchymoses.  No gross deformity. No TTP. FROM passively.  Abduction, flexion to 90 degrees and painful.  Full ER and IR with pain only on ER. Positive Hawkins, Neers. Strength 3+/5 with empty can and 4/5 resisted external rotation, 5/5 IR. NV intact distally.  Left shoulder: No swelling, ecchymoses.  No gross deformity. No TTP AC, biceps tendon. Full passive motion.  60 degrees abduction and flexion.  Full Er and IR. Positive Hawkins, Neers. Strength 2/5 with empty can and 5/5 resisted internal/external rotation. NV intact distally.  Brief MSK u/s:  Infraspinatus on right looks intact without tears.  Unable to visualize supraspinatus consistent with chronic supraspinatus retracted tear.   Assessment & Plan:  1. Bilateral shoulder pain - chronic.  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  We discussed imaging (MRI), injection, physical therapy.  She wanted to do subacromial injection today - given on right.  Given chronic nature of right shoulder pain, retracted tear but no acute injury, likely this is also irreparable.  Tylenol if needed. Motion exercises and advance to strengthening.  F/u in 6 weeks.  After informed written consent timeout was performed, patient was seated on exam table. Right shoulder was prepped with alcohol swab and utilizing posterior approach, patient's right subacromial space was injected with 3:1 bupivicaine: depomedrol. Patient tolerated the procedure well without immediate complications.

## 2018-04-01 NOTE — Patient Instructions (Signed)
You have rotator cuff impingement of your right shoulder with likely chronic tear here, known tears of your left shoulder. Try to avoid painful activities (overhead activities, lifting with extended arm) as much as possible. Tylenol as needed for pain. You were given subacromial injection today in your right shoulder. Consider physical therapy with transition to home exercise program. Motion exercises (arm circles, swings, table slides) 3 sets of 10 once a day. When tolerated do home exercise program with theraband and scapular stabilization exercises daily 3 sets of 10 once a day (yellow theraband). If not improving at follow-up we will consider imaging, physical therapy. Follow up with me in 6 weeks.

## 2018-04-08 DIAGNOSIS — H401131 Primary open-angle glaucoma, bilateral, mild stage: Secondary | ICD-10-CM | POA: Diagnosis not present

## 2018-04-08 DIAGNOSIS — H40043 Steroid responder, bilateral: Secondary | ICD-10-CM | POA: Diagnosis not present

## 2018-04-08 DIAGNOSIS — H35033 Hypertensive retinopathy, bilateral: Secondary | ICD-10-CM | POA: Diagnosis not present

## 2018-04-08 DIAGNOSIS — E119 Type 2 diabetes mellitus without complications: Secondary | ICD-10-CM | POA: Diagnosis not present

## 2018-04-18 ENCOUNTER — Other Ambulatory Visit: Payer: Self-pay | Admitting: Family

## 2018-05-10 ENCOUNTER — Ambulatory Visit: Payer: Medicare Other | Admitting: Family

## 2018-05-10 NOTE — Progress Notes (Signed)
Subjective:   Brittany Robles is a 77 y.o. female who presents for Medicare Annual (Subsequent) preventive examination.  Review of Systems: No ROS.  Medicare Wellness Visit. Additional risk factors are reflected in the social history. Cardiac Risk Factors include: advanced age (>25mn, >>82women);dyslipidemia;hypertension;diabetes mellitus Sleep patterns: No issues Home Safety/Smoke Alarms: Feels safe in home. Smoke alarms in place. Lives on 6th floor condo. Uses elevator. Step over tub with grab rails.  Female:       Mammo-  Pt reports done last yr. Normal. Report requested  Dexa scan- declines [facility]- declines Eye-Dr.Hecker 2 week ago per pt. Goes yearly.    Objective:     Vitals: BP 138/76 (BP Location: Left Arm, Patient Position: Sitting, Cuff Size: Normal)   Pulse 77   Ht _0  (1.499 m)   Wt 124 lb 6.4 oz (56.4 kg)   LMP 08/19/1983   SpO2 97%   BMI 25.13 kg/m   Body mass index is 25.13 kg/m.  Advanced Directives 05/11/2018 11/25/2016 03/31/2016 03/15/2013 03/03/2013  Does Patient Have a Medical Advance Directive? Yes No Yes Patient does not have advance directive;Patient would not like information -  Type of [place] - Living will - -  Does patient want to make changes to medical advance directive? - Yes (MAU/Ambulatory/Procedural Areas - Information given) - - -  Copy of [place] Chart? Yes - No - copy requested - -  Pre-existing out of facility DNR order (yellow form or pink MOST form) - - - No No    Tobacco Social History   Tobacco Use  Smoking Status Former Smoker  . Last attempt to quit: 04/18/2010  . Years since quitting: 8.0  Smokeless Tobacco Never Used     Counseling given: Not Answered   Clinical Intake: Pain : No/denies pain     Past Medical History:  Diagnosis Date  . [place]   . Carotid artery stenosis 03/08/2012   Bilateral- 40-50% per duplex 7/13.  Needs  follow up duplex in 1 year.   . Carpal tunnel syndrome, bilateral   . Diabetes mellitus    Type 2  . Hypertension   . Osteopenia 04/27/2016   -1.8 femur 9/17  . POLYCYSTIC KIDNEY DISEASE 10/11/2009  . PONV (postoperative nausea and vomiting)   . 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 Spinal stenosis of lumbar region    Past Surgical History:  Procedure Laterality Date  . ANTERIOR CERVICAL DECOMPRESSION/DISCECTOMY FUSION 4 LEVELS N/A 03/15/2013   Procedure: Cervical Three-Four Cervical Four-Five Cervical Five-Six Cervical Six-Seven Anterior cervical decompression/diskectomy/fusion;  Surgeon: EFloyce Stakes MD;  [facility];  Service: Neurosurgery;  Laterality: N/A;  Cervical Three-Four Cervical Four-Five Cervical Five-Six Cervical Six-Seven Anterior cervical decompression/diskectomy/fusion  . APPENDECTOMY    . BREAST SURGERY     bx,   neg  . CARPAL TUNNEL RELEASE    . CESAREAN SECTION     x 2  . SMALL INTESTINE SURGERY     for bowel obstruction  . SPINE SURGERY     lumbar spinal stenosis   Family History  Problem Relation Age of Onset  . Cancer Mother        lung  . Cancer Sister        throat  . Cancer Brother        lung  . Cancer Other        Lung  . CAD Son 433  stent   Social History   Socioeconomic History  . Marital status: Divorced    Spouse name: Not on file  . Number of children: 3  . Years of education: Not on file  . Highest education level: Not on file  Occupational History  . Occupation: Retired  Scientific laboratory technician  . Financial resource strain: Not on file  . Food insecurity:    Worry: Not on file    Inability: Not on file  . Transportation needs:    Medical: Not on file    Non-medical: Not on file  Tobacco Use  . Smoking status: Former Smoker    Last attempt to quit: 04/18/2010    Years since quitting: 8.0  . Smokeless tobacco: Never Used  Substance and Sexual Activity  . Alcohol use: Yes    Alcohol/week: 2.0 standard drinks     Types: 2 Shots of liquor per week  . Drug use: No  . Sexual activity: Yes  Lifestyle  . Physical activity:    Days per week: Not on file    Minutes per session: Not on file  . Stress: Not on file  Relationships  . Social connections:    Talks on phone: Not on file    Gets together: Not on file    Attends religious service: Not on file    Active member of club or organization: Not on file    Attends meetings of clubs or organizations: Not on file    Relationship status: Not on file  Other Topics Concern  . 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 acetaminophen (TYLENOL) 325 MG tablet Take 650 mg by mouth every 6 (six) hours as needed for pain or fever.   Marland Kitchen amLODipine (NORVASC) 10 MG tablet TAKE ONE-HALF TABLET BY  MOUTH DAILY  . aspirin EC 81 MG tablet Take 81 mg by mouth daily.  . Blood Glucose Monitoring Suppl (ONE TOUCH ULTRA MINI) w/Device KIT Use to check blood sugar once daily.  . Calcium Carbonate-Vitamin D (CALTRATE 600+D) 600-400 MG-UNIT per tablet Take 1 tablet by mouth daily.  . cholecalciferol (VITAMIN D) 1000 units tablet Take 1,000 Units by mouth daily.  Marland Kitchen glucose blood (ONE TOUCH ULTRA TEST) test strip TEST EVERY DAY  . Lancets (ONETOUCH ULTRASOFT) lancets TEST EVERY DAY  . latanoprost (XALATAN) 0.005 % ophthalmic solution Place 1 drop into both eyes at bedtime.  . metoprolol succinate (TOPROL-XL) 50 MG 24 hr tablet TAKE 1 TABLET (50 MG TOTAL) BY MOUTH DAILY. TAKE WITH OR IMMEDIATELY FOLLOWING A MEAL.  Marland Kitchen nystatin (MYCOSTATIN/NYSTOP) powder Apply topically 2 (two) times daily as needed.  . prednisoLONE acetate (PRED FORTE) 1 % ophthalmic suspension   . valACYclovir (VALTREX) 1000 MG tablet Take 1 tablet (1,000 mg total) by mouth daily as needed.  Marland Kitchen atorvastatin (LIPITOR) 10 MG tablet TAKE 1 TABLET  BY MOUTH  DAILY (Patient not taking: Reported on 05/11/2018)  . diazepam (VALIUM) 5 MG tablet 1 tab PO 30 minutes prior to procedure - may repeat x1 (Patient not taking: Reported on 05/11/2018)  . HYDROcodone-acetaminophen (NORCO) 5-325 MG tablet Take 1 tablet by mouth every 6 (six) hours as needed for moderate pain. (Patient not taking: Reported on 05/11/2018)   No facility-administered encounter medications on file as of 05/11/2018.  Activities of Daily Living In your present state of health, do you have any difficulty performing the following activities: 05/11/2018  Hearing? N  Vision? N  Difficulty concentrating or making decisions? N  Walking or climbing stairs? N  Dressing or bathing? N  Doing errands, shopping? N  Preparing Food and eating ? N  Using the Toilet? N  In the past six months, have you accidently leaked urine? N  Do you have problems with loss of bowel control? N  Managing your Medications? N  Managing your Finances? N  Housekeeping or managing your Housekeeping? N  Some recent data might be hidden    Patient Care Team: Debbrah Alar, NP as PCP - General (Internal Medicine) Monna Fam, MD as Consulting Physician (Ophthalmology) Fleet Contras, MD as Consulting Physician (Nephrology) Leeroy Cha, MD as Consulting Physician (Neurosurgery)    Assessment:   This is a routine wellness examination for [place]. Physical assessment deferred to PCP.  Exercise Activities and Dietary recommendations Exercise limited by: None identified Diet (meal preparation, eat out, water intake, caffeinated beverages, dairy products, fruits and vegetables): 24 hour recall Breakfast: cereal or oatmeal Lunch: skip Dinner: sardines, toast, sliced tomatoes. Pt reports she needs to drink more water.  Goals    . Patient Stated (pt-stated)     Stay as healthy as I possibly can.       Fall Risk Fall Risk  05/11/2018 09/09/2017 05/15/2017 11/25/2016 09/05/2016  Falls in  the past year? _0   Risk for fall due to : - - Other (Comment) - -    Depression Screen PHQ 2/9 Scores 05/11/2018 05/15/2017 11/25/2016 09/05/2016  PHQ - 2 Score 0 - 0 0  Exception Documentation - Other- indicate reason in comment box - -     Cognitive Function MMSE - Mini Mental State Exam 05/11/2018 11/25/2016 03/31/2016  Orientation to time _1 Orientation to Place _2 Registration _3 Attention/ Calculation _4 Recall _5 Language- name 2 objects _6 Language- repeat _7 Language- follow 3 step command _8 Language- read & follow direction _9 Write a sentence _10 Copy design _11 Total score _12 Immunization History  Administered Date(s) Administered  . Influenza Split 05/10/2012  . Influenza, High Dose Seasonal PF 05/26/2017  . Influenza,inj,Quad PF,6+ Mos 05/06/2013, 05/18/2015  . Influenza-Unspecified 04/18/2014, 07/02/2016  . Pneumococcal Conjugate-13 08/01/2013  . Pneumococcal Polysaccharide-23 06/13/2008  . Td 01/01/2004, 09/06/2014  . Zoster 02/15/2014   Screening Tests Health Maintenance  Topic Date Due  . FOOT EXAM  11/25/2017  . URINE MICROALBUMIN  11/25/2017  . HEMOGLOBIN A1C  01/13/2018  . INFLUENZA VACCINE  03/18/2018  . OPHTHALMOLOGY EXAM  04/02/2018  . TETANUS/TDAP  09/06/2024  . DEXA SCAN  Completed  . PNA vac Low Risk Adult  Completed        Plan:    Please schedule your next medicare wellness visit with me in 1 yr.  Continue to eat heart healthy diet (full of fruits, vegetables, whole grains, lean protein, water--limit salt, fat, and sugar intake) and increase physical activity as tolerated.  Continue doing brain stimulating activities (puzzles, reading, adult coloring books, staying active) to keep memory sharp.     I have personally reviewed and noted the following in the patient's chart:   .  Medical and social history . Use of alcohol, tobacco or illicit drugs  . Current  medications and supplements . Functional ability and status . Nutritional status . Physical activity . Advanced directives . List of other physicians . Hospitalizations, surgeries, and ER visits in previous 12 months . Vitals . Screenings to include cognitive, depression, and falls . Referrals and appointments  In addition, I have reviewed and discussed with patient certain preventive protocols, quality metrics, and best practice recommendations. A written personalized care plan for preventive services as well as general preventive health recommendations were provided to patient.     Naaman Plummer [place], [place]  05/11/2018

## 2018-05-11 ENCOUNTER — Encounter: Payer: Self-pay | Admitting: Family

## 2018-05-11 ENCOUNTER — Ambulatory Visit (INDEPENDENT_AMBULATORY_CARE_PROVIDER_SITE_OTHER): Payer: Medicare Other | Admitting: *Deleted

## 2018-05-11 ENCOUNTER — Encounter: Payer: Self-pay | Admitting: *Deleted

## 2018-05-11 ENCOUNTER — Ambulatory Visit (INDEPENDENT_AMBULATORY_CARE_PROVIDER_SITE_OTHER): Payer: Medicare Other | Admitting: Family

## 2018-05-11 VITALS — BP 138/76 | HR 77 | Ht 59.0 in | Wt 124.4 lb

## 2018-05-11 VITALS — BP 138/76 | HR 77 | Temp 98.7°F | Resp 16 | Ht 59.0 in | Wt 124.0 lb

## 2018-05-11 DIAGNOSIS — E785 Hyperlipidemia, unspecified: Secondary | ICD-10-CM

## 2018-05-11 DIAGNOSIS — Z23 Encounter for immunization: Secondary | ICD-10-CM

## 2018-05-11 DIAGNOSIS — E1129 Type 2 diabetes mellitus with other diabetic kidney complication: Secondary | ICD-10-CM | POA: Diagnosis not present

## 2018-05-11 DIAGNOSIS — I10 Essential (primary) hypertension: Secondary | ICD-10-CM

## 2018-05-11 DIAGNOSIS — Z0000 Encounter for general adult medical examination without abnormal findings: Secondary | ICD-10-CM

## 2018-05-11 NOTE — Patient Instructions (Signed)
Please schedule your next medicare wellness visit with me in 1 yr.  Continue to eat heart healthy diet (full of fruits, vegetables, whole grains, lean protein, water--limit salt, fat, and sugar intake) and increase physical activity as tolerated.  Continue doing brain stimulating activities (puzzles, reading, adult coloring books, staying active) to keep memory sharp.    [name] , Thank you for taking time to come for your Medicare Wellness Visit. I appreciate your ongoing commitment to your health goals. Please review the following plan we discussed and let me know if I can assist you in the future.   These are the goals we discussed: Goals    . Patient Stated (pt-stated)     Stay as healthy as I possibly can.       This is a list of the screening recommended for you and due dates:  Health Maintenance  Topic Date Due  . Complete foot exam   11/25/2017  . Urine Protein Check  11/25/2017  . Hemoglobin A1C  01/13/2018  . Flu Shot  03/18/2018  . Eye exam for diabetics  04/02/2018  . Tetanus Vaccine  09/06/2024  . DEXA scan (bone density measurement)  Completed  . Pneumonia vaccines  Completed    Health Maintenance for Postmenopausal Women Menopause is a normal process in which your reproductive ability comes to an end. This process happens gradually over a span of months to years, usually between the ages of 55 and 56. Menopause is complete when you have missed 12 consecutive menstrual periods. It is important to talk with your health care provider about some of the most common conditions that affect postmenopausal women, such as heart disease, cancer, and bone loss (osteoporosis). Adopting a healthy lifestyle and getting preventive care can help to promote your health and wellness. Those actions can also lower your chances of developing some of these common conditions. What should I know about menopause? During menopause, you may experience a number of symptoms, such  as:  Moderate-to-severe hot flashes.  Night sweats.  Decrease in sex drive.  Mood swings.  Headaches.  Tiredness.  Irritability.  Memory problems.  Insomnia.  Choosing to treat or not to treat menopausal changes is an individual decision that you make with your health care provider. What should I know about hormone replacement therapy and supplements? Hormone therapy products are effective for treating symptoms that are associated with menopause, such as hot flashes and night sweats. Hormone replacement carries certain risks, especially as you become older. If you are thinking about using estrogen or estrogen with progestin treatments, discuss the benefits and risks with your health care provider. What should I know about heart disease and stroke? Heart disease, heart attack, and stroke become more likely as you age. This may be due, in part, to the hormonal changes that your body experiences during menopause. These can affect how your body processes dietary fats, triglycerides, and cholesterol. Heart attack and stroke are both medical emergencies. There are many things that you can do to help prevent heart disease and stroke:  Have your blood pressure checked at least every 1-2 years. High blood pressure causes heart disease and increases the risk of stroke.  If you are 82-51 years old, ask your health care provider if you should take aspirin to prevent a heart attack or a stroke.  Do not use any tobacco products, including cigarettes, chewing tobacco, or electronic cigarettes. If you need help quitting, ask your health care provider.  It is important to  eat a healthy diet and maintain a healthy weight. ? Be sure to include plenty of vegetables, fruits, low-fat dairy products, and lean protein. ? Avoid eating foods that are high in solid fats, added sugars, or salt (sodium).  Get regular exercise. This is one of the most important things that you can do for your health. ? Try  to exercise for at least 150 minutes each week. The type of exercise that you do should increase your heart rate and make you sweat. This is known as moderate-intensity exercise. ? Try to do strengthening exercises at least twice each week. Do these in addition to the moderate-intensity exercise.  Know your numbers.Ask your health care provider to check your cholesterol and your blood glucose. Continue to have your blood tested as directed by your health care provider.  What should I know about cancer screening? There are several types of cancer. Take the following steps to reduce your risk and to catch any cancer development as early as possible. Breast Cancer  Practice breast self-awareness. ? This means understanding how your breasts normally appear and feel. ? It also means doing regular breast self-exams. Let your health care provider know about any changes, no matter how small.  If you are 72 or older, have a clinician do a breast exam (clinical breast exam or CBE) every year. Depending on your age, family history, and medical history, it may be recommended that you also have a yearly breast X-ray (mammogram).  If you have a family history of breast cancer, talk with your health care provider about genetic screening.  If you are at high risk for breast cancer, talk with your health care provider about having an MRI and a mammogram every year.  Breast cancer (BRCA) gene test is recommended for women who have family members with BRCA-related cancers. Results of the assessment will determine the need for genetic counseling and BRCA1 and for BRCA2 testing. BRCA-related cancers include these types: ? Breast. This occurs in males or females. ? Ovarian. ? Tubal. This may also be called fallopian tube cancer. ? Cancer of the abdominal or pelvic lining (peritoneal cancer). ? Prostate. ? Pancreatic.  Cervical, Uterine, and Ovarian Cancer Your health care provider may recommend that you be  screened regularly for cancer of the pelvic organs. These include your ovaries, uterus, and vagina. This screening involves a pelvic exam, which includes checking for microscopic changes to the surface of your cervix (Pap test).  For women ages 21-65, health care providers may recommend a pelvic exam and a Pap test every three years. For women ages 35-65, they may recommend the Pap test and pelvic exam, combined with testing for human papilloma virus (HPV), every five years. Some types of HPV increase your risk of cervical cancer. Testing for HPV may also be done on women of any age who have unclear Pap test results.  Other health care providers may not recommend any screening for nonpregnant women who are considered low risk for pelvic cancer and have no symptoms. Ask your health care provider if a screening pelvic exam is right for you.  If you have had past treatment for cervical cancer or a condition that could lead to cancer, you need Pap tests and screening for cancer for at least 20 years after your treatment. If Pap tests have been discontinued for you, your risk factors (such as having a new sexual partner) need to be reassessed to determine if you should start having screenings again. Some women have  medical problems that increase the chance of getting cervical cancer. In these cases, your health care provider may recommend that you have screening and Pap tests more often.  If you have a family history of uterine cancer or ovarian cancer, talk with your health care provider about genetic screening.  If you have vaginal bleeding after reaching menopause, tell your health care provider.  There are currently no reliable tests available to screen for ovarian cancer.  Lung Cancer Lung cancer screening is recommended for adults 27-24 years old who are at high risk for lung cancer because of a history of smoking. A yearly low-dose CT scan of the lungs is recommended if you:  Currently  smoke.  Have a history of at least 30 pack-years of smoking and you currently smoke or have quit within the past 15 years. A pack-year is smoking an average of one pack of cigarettes per day for one year.  Yearly screening should:  Continue until it has been 15 years since you quit.  Stop if you develop a health problem that would prevent you from having lung cancer treatment.  Colorectal Cancer  This type of cancer can be detected and can often be prevented.  Routine colorectal cancer screening usually begins at age 105 and continues through age 110.  If you have risk factors for colon cancer, your health care provider may recommend that you be screened at an earlier age.  If you have a family history of colorectal cancer, talk with your health care provider about genetic screening.  Your health care provider may also recommend using home test kits to check for hidden blood in your stool.  A small camera at the end of a tube can be used to examine your colon directly (sigmoidoscopy or colonoscopy). This is done to check for the earliest forms of colorectal cancer.  Direct examination of the colon should be repeated every 5-10 years until age 96. However, if early forms of precancerous polyps or small growths are found or if you have a family history or genetic risk for colorectal cancer, you may need to be screened more often.  Skin Cancer  Check your skin from head to toe regularly.  Monitor any moles. Be sure to tell your health care provider: ? About any new moles or changes in moles, especially if there is a change in a mole's shape or color. ? If you have a mole that is larger than the size of a pencil eraser.  If any of your family members has a history of skin cancer, especially at a Tumblin age, talk with your health care provider about genetic screening.  Always use sunscreen. Apply sunscreen liberally and repeatedly throughout the day.  Whenever you are outside, protect  yourself by wearing long sleeves, pants, a wide-brimmed hat, and sunglasses.  What should I know about osteoporosis? Osteoporosis is a condition in which bone destruction happens more quickly than new bone creation. After menopause, you may be at an increased risk for osteoporosis. To help prevent osteoporosis or the bone fractures that can happen because of osteoporosis, the following is recommended:  If you are 28-75 years old, get at least 1,000 mg of calcium and at least 600 mg of vitamin D per day.  If you are older than age 68 but younger than age 29, get at least 1,200 mg of calcium and at least 600 mg of vitamin D per day.  If you are older than age 99, get at least 26,200  mg of calcium and at least 800 mg of vitamin D per day.  Smoking and excessive alcohol intake increase the risk of osteoporosis. Eat foods that are rich in calcium and vitamin D, and do weight-bearing exercises several times each week as directed by your health care provider. What should I know about how menopause affects my mental health? Depression may occur at any age, but it is more common as you become older. Common symptoms of depression include:  Low or sad mood.  Changes in sleep patterns.  Changes in appetite or eating patterns.  Feeling an overall lack of motivation or enjoyment of activities that you previously enjoyed.  Frequent crying spells.  Talk with your health care provider if you think that you are experiencing depression. What should I know about immunizations? It is important that you get and maintain your immunizations. These include:  Tetanus, diphtheria, and pertussis (Tdap) booster vaccine.  Influenza every year before the flu season begins.  Pneumonia vaccine.  Shingles vaccine.  Your health care provider may also recommend other immunizations. This information is not intended to replace advice given to you by your health care provider. Make sure you discuss any questions you  have with your health care provider. Document Released: 09/26/2005 Document Revised: 02/22/2016 Document Reviewed: 05/08/2015 Elsevier Interactive Patient Education  2018 [employer].

## 2018-05-11 NOTE — Progress Notes (Signed)
Subjective:    Patient ID: Brittany Robles, female    DOB: [birth year], 77 y.o.   MRN: [id number]  HPI  Brittany Robles is a 77 yr old female who presents today for follow up.  DM2- diet controlled. Reports fasting sugars generally run 80-90.  Tries to watch her diet.  Lab Results  Component Value Date   HGBA1C 7.0 (H) 07/16/2017   HGBA1C 7.0 (H) 11/25/2016   HGBA1C 6.9 (H) 08/01/2016   Lab Results  Component Value Date   MICROALBUR 1.0 11/25/2016   LDLCALC 73 07/16/2017   CREATININE 1.17 (H) 11/21/2017   Hyperlipidemia- maintained on lipitor 43m.  Denies myalgia.  Does have some cramping in her left leg at night.   Lab Results  Component Value Date   CHOL 143 07/16/2017   HDL 56.40 07/16/2017   LDLCALC 73 07/16/2017   TRIG 67.0 07/16/2017   CHOLHDL 3 07/16/2017   HTN- continues toprol xl.  She denies CP/SOB or swelling.  BP Readings from Last 3 Encounters:  05/11/18 138/76  04/01/18 (!) 145/76  11/21/17 (!) 150/82      Review of Systems See HPI  Past Medical History:  Diagnosis Date  . Bowel obstruction ([place]   . Carotid artery stenosis 03/08/2012   Bilateral- 40-50% per duplex 7/13.  Needs follow up duplex in 1 year.   . Carpal tunnel syndrome, bilateral   . Diabetes mellitus    Type 2  . Hypertension   . Osteopenia 04/27/2016   -1.8 femur 9/17  . POLYCYSTIC KIDNEY DISEASE 10/11/2009  . PONV (postoperative nausea and vomiting)   . 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 Spinal stenosis of lumbar region      Social History   Socioeconomic History  . Marital status: Divorced    Spouse name: Not on file  . Number of children: 3  . Years of education: Not on file  . Highest education level: Not on file  Occupational History  . Occupation: Retired  SScientific laboratory technician . Financial resource strain: Not on file  . Food insecurity:    Worry: Not on file    Inability: Not on file  . Transportation needs:    Medical: Not on file    Non-medical: Not on file    Tobacco Use  . Smoking status: Former Smoker    Last attempt to quit: 04/18/2010    Years since quitting: 8.0  . Smokeless tobacco: Never Used  Substance and Sexual Activity  . Alcohol use: Yes    Alcohol/week: 2.0 standard drinks    Types: 2 Shots of liquor per week  . Drug use: No  . Sexual activity: Yes  Lifestyle  . Physical activity:    Days per week: Not on file    Minutes per session: Not on file  . Stress: Not on file  Relationships  . Social connections:    Talks on phone: Not on file    Gets together: Not on file    Attends religious service: Not on file    Active member of club or organization: Not on file    Attends meetings of clubs or organizations: Not on file    Relationship status: Not on file  . Intimate partner violence:    Fear of current or ex partner: Not on file    Emotionally abused: Not on file    Physically abused: Not on file    Forced sexual activity: Not on file  Other Topics Concern  .  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 ANTERIOR CERVICAL DECOMPRESSION/DISCECTOMY FUSION 4 LEVELS N/A 03/15/2013   Procedure: Cervical Three-Four Cervical Four-Five Cervical Five-Six Cervical Six-Seven Anterior cervical decompression/diskectomy/fusion;  Surgeon: Floyce Stakes, MD;  Location: [facility];  Service: Neurosurgery;  Laterality: N/A;  Cervical Three-Four Cervical Four-Five Cervical Five-Six Cervical Six-Seven Anterior cervical decompression/diskectomy/fusion  . APPENDECTOMY    . BREAST SURGERY     bx,   neg  . CARPAL TUNNEL RELEASE    . CESAREAN SECTION     x 2  . SMALL INTESTINE SURGERY     for bowel obstruction  . SPINE SURGERY     lumbar spinal stenosis    Family History  Problem Relation Age of Onset  . Cancer Mother        lung  . Cancer Sister        throat  . Cancer  Brother        lung  . Cancer Other        Lung  . CAD Son 33       stent    Allergies  Allergen Reactions  . Shellfish-Derived Products Swelling  . Cozaar [Losartan Potassium] Other (See Comments)    hyperkalemia  . Medrol [Methylprednisolone] Other (See Comments)    Near syncope, low BP  . Ibuprofen Other (See Comments)    Strange feelings and dreams  . Lisinopril Other (See Comments)    Unknown reaction    Current Outpatient Medications on File Prior to Visit  Medication Sig Dispense Refill  . acetaminophen (TYLENOL) 325 MG tablet Take 650 mg by mouth every 6 (six) hours as needed for pain or fever.     Marland Kitchen amLODipine (NORVASC) 10 MG tablet TAKE ONE-HALF TABLET BY  MOUTH DAILY 45 tablet 1  . aspirin EC 81 MG tablet Take 81 mg by mouth daily.    Marland Kitchen atorvastatin (LIPITOR) 10 MG tablet TAKE 1 TABLET BY MOUTH  DAILY 90 tablet 1  . Blood Glucose Monitoring Suppl (ONE TOUCH ULTRA MINI) w/Device KIT Use to check blood sugar once daily. 1 each 0  . Calcium Carbonate-Vitamin D (CALTRATE 600+D) 600-400 MG-UNIT per tablet Take 1 tablet by mouth daily.    . cholecalciferol (VITAMIN D) 1000 units tablet Take 1,000 Units by mouth daily.    . diazepam (VALIUM) 5 MG tablet 1 tab PO 30 minutes prior to procedure - may repeat x1 2 tablet 0  . glucose blood (ONE TOUCH ULTRA TEST) test strip TEST EVERY DAY 1 each 1  . HYDROcodone-acetaminophen (NORCO) 5-325 MG tablet Take 1 tablet by mouth every 6 (six) hours as needed for moderate pain. 20 tablet 0  . Lancets (ONETOUCH ULTRASOFT) lancets TEST EVERY DAY 100 each 1  . latanoprost (XALATAN) 0.005 % ophthalmic solution Place 1 drop into both eyes at bedtime.    . metoprolol succinate (TOPROL-XL) 50 MG 24 hr tablet TAKE 1 TABLET (50 MG TOTAL) BY MOUTH DAILY. TAKE WITH OR IMMEDIATELY FOLLOWING A MEAL. 90 tablet 1  . nystatin (MYCOSTATIN/NYSTOP) powder Apply topically 2 (two) times daily as needed. 45 g 1  . prednisoLONE acetate (PRED FORTE) 1 %  ophthalmic suspension     . valACYclovir (VALTREX) 1000 MG tablet Take 1 tablet (1,000 mg total) by mouth daily as needed.  30 tablet 1   No current facility-administered medications on file prior to visit.     LMP 08/19/1983       Objective:   Physical Exam  Constitutional: She is oriented to person, place, and time. She appears well-developed and well-nourished.  Cardiovascular: Normal rate, regular rhythm and normal heart sounds.  No murmur heard. Pulmonary/Chest: Effort normal and breath sounds normal. No respiratory distress. She has no wheezes.  Musculoskeletal: She exhibits no edema.  Neurological: She is alert and oriented to person, place, and time.  Skin: Skin is warm and dry.  Psychiatric: She has a normal mood and affect. Her behavior is normal. Judgment and thought content normal.          Assessment & Plan:  DM2- Clinically stable.  Obtain a1c.  HTN- blood pressure is stable. Continue current dose of amlodipine.   BP Readings from Last 3 Encounters:  05/11/18 138/76  04/01/18 (!) 145/76  11/21/17 (!) 150/82   Hyperlipidemia- obtain follow up lipid panel. Continue statin.   Flu shot today.

## 2018-05-11 NOTE — Patient Instructions (Signed)
Please complete lab work prior to leaving. Ice your jaw area twice daily and use tylenol as needed. Wear bite guard nightly and eat soft foods until pain is improved. Let me know if your jaw pain worsens or if it does not improve in the next few weeks.

## 2018-05-11 NOTE — Progress Notes (Signed)
RN note reviewed and agree.  Jasier Calabretta S O'Sullivan NP 

## 2018-05-13 ENCOUNTER — Ambulatory Visit: Payer: Medicare Other | Admitting: Family Medicine

## 2018-07-14 ENCOUNTER — Other Ambulatory Visit: Payer: Self-pay

## 2018-07-14 NOTE — Patient Outreach (Signed)
[facility] Care Management  07/14/2018  Brittany Robles 12-31-1940 [id number]   Medication Adherence call to [name] patient did not answer patient is due on Atorvastatin 10 mg. [name] is showing past due under [facility].   Lillia AbedAna Ollison-Moran CPhT Pharmacy Technician [facility] Direct Dial [phone]  Fax [phone] [name]

## 2018-07-19 ENCOUNTER — Other Ambulatory Visit: Payer: Self-pay | Admitting: Family

## 2018-07-26 ENCOUNTER — Other Ambulatory Visit: Payer: Self-pay

## 2018-07-26 NOTE — Patient Outreach (Signed)
[facility] Care Management  07/26/2018  Elsie Lincolnngela M Bealer 1941-03-15 [id number]   Medication Adherence call to Mrs. Hortencia Conradingela Lamy spoke with patient she is due on Atorvastatin 10 mg she has stop taking this medication, she does not want to continued on this medication, patient has not gone over with doctor. Mrs. Hartsell is showing past due under [facility].   Lillia AbedAna Ollison-Moran CPhT Pharmacy Technician [facility] Direct Dial [phone]  Fax [phone] [email or web address]

## 2018-09-19 ENCOUNTER — Other Ambulatory Visit: Payer: Self-pay | Admitting: Family

## 2018-09-20 NOTE — Telephone Encounter (Signed)
90 day supply of amlodipine sent to pharmacy. Pt was due for follow up with PCP on 09/10/18. Mailed letter to pt to call and schedule follow up.

## 2018-10-27 ENCOUNTER — Other Ambulatory Visit: Payer: Self-pay | Admitting: Family

## 2018-12-16 ENCOUNTER — Other Ambulatory Visit: Payer: Self-pay | Admitting: Family

## 2018-12-16 NOTE — Telephone Encounter (Signed)
Refill sent but pt is past due for follow up. Please contact pt to arrange office visit.

## 2018-12-20 NOTE — Telephone Encounter (Signed)
Per patient she is now seeing a new provider, was not able to provide new MD information "she will call back"

## 2019-02-22 ENCOUNTER — Other Ambulatory Visit: Payer: Self-pay | Admitting: Family

## 2019-05-05 ENCOUNTER — Telehealth: Payer: Self-pay | Admitting: Family

## 2019-05-05 NOTE — Telephone Encounter (Signed)
Spoke with patient regarding AWV. Patient stated that she will give office a call back when she is ready to schedule.

## 2019-07-22 ENCOUNTER — Other Ambulatory Visit: Payer: Self-pay | Admitting: Family

## 2019-07-22 NOTE — Telephone Encounter (Signed)
Please contact pt and let her know that I sent a 30 day prescription to her pharmacy- but she is past due for follow up.  Please schedule a virtual office visit with me.

## 2019-07-26 NOTE — Telephone Encounter (Signed)
Per patient she has a new provider. No need for further refills from [place].

## 2019-09-26 ENCOUNTER — Ambulatory Visit: Payer: Medicare Other | Attending: Internal Medicine

## 2019-09-26 DIAGNOSIS — Z23 Encounter for immunization: Secondary | ICD-10-CM

## 2019-09-26 NOTE — Progress Notes (Signed)
   Covid-19 Vaccination Clinic  Name:  Brittany Robles    MRN: [id number] DOB: [birth year]  09/26/2019  [name] was observed post Covid-19 immunization for 15 minutes without incidence. She was provided with Vaccine Information Sheet and instruction to access the V-Safe system.   [name] was instructed to call 911 with any severe reactions post vaccine: Marland Kitchen Difficulty breathing  . Swelling of your face and throat  . A fast heartbeat  . A bad rash all over your body  . Dizziness and weakness    Immunizations Administered    Name Date Dose VIS Date Route   Moderna COVID-19 Vaccine 09/26/2019  2:33 PM 0.5 mL 07/19/2019 Intramuscular   Manufacturer: Moderna   Lot: [id number]   NDC: 79892-119-41

## 2019-10-26 ENCOUNTER — Ambulatory Visit: Payer: Medicare Other | Attending: Internal Medicine

## 2019-10-26 DIAGNOSIS — Z23 Encounter for immunization: Secondary | ICD-10-CM | POA: Insufficient documentation

## 2019-10-26 NOTE — Progress Notes (Signed)
   Covid-19 Vaccination Clinic  Name:  KORRI ASK    MRN: [id number] DOB: [birth year]  10/26/2019  Ms. Guarisco was observed post Covid-19 immunization for 15 minutes without incident. She was provided with Vaccine Information Sheet and instruction to access the V-Safe system.   Ms. Marcinek was instructed to call 911 with any severe reactions post vaccine: Marland Kitchen Difficulty breathing  . Swelling of face and throat  . A fast heartbeat  . A bad rash all over body  . Dizziness and weakness   Immunizations Administered    Name Date Dose VIS Date Route   Moderna COVID-19 Vaccine 10/26/2019  1:25 PM 0.5 mL 07/19/2019 Intramuscular   Manufacturer: Moderna   Lot: [id number]   NDC: 29244-628-63
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
## 2021-02-11 ENCOUNTER — Encounter (HOSPITAL_COMMUNITY): Payer: Self-pay

## 2021-02-11 ENCOUNTER — Ambulatory Visit (HOSPITAL_COMMUNITY)
Admission: EM | Admit: 2021-02-11 | Discharge: 2021-02-11 | Disposition: A | Discharge status: Home / Self Care | Payer: Medicare (Managed Care) | Attending: Family Medicine | Admitting: Family Medicine

## 2021-02-11 DIAGNOSIS — M25512 Pain in left shoulder: Secondary | ICD-10-CM | POA: Diagnosis not present

## 2021-02-11 DIAGNOSIS — R030 Elevated blood-pressure reading, without diagnosis of hypertension: Secondary | ICD-10-CM | POA: Diagnosis not present

## 2021-02-11 MED ORDER — DICLOFENAC SODIUM 1 % EX GEL: 2.0000 g | Freq: Four times a day (QID) | CUTANEOUS | 1 refills | Status: DC

## 2021-02-11 MED ORDER — KETOROLAC TROMETHAMINE 30 MG/ML IJ SOLN
30.0000 mg | Freq: Once | INTRAMUSCULAR | Status: AC
  Administered 2021-02-11: 16:00:00 30 mg via INTRAMUSCULAR

## 2021-02-11 MED ORDER — KETOROLAC TROMETHAMINE 30 MG/ML IJ SOLN
INTRAMUSCULAR | Status: AC
  Filled 2021-02-11: qty 1

## 2021-02-11 NOTE — Discharge Instructions (Addendum)
Go to the emergency room if your symptoms worsen or do not resolve

## 2021-02-11 NOTE — ED Triage Notes (Signed)
Pt presents with left shoulder pain that radiates to the elbow x2 days  She states she recently had medication changes and is not sure if that is what has caused her shoulder pain.   Pt denies SOB and chest pain.

## 2021-02-12 ENCOUNTER — Emergency Department (HOSPITAL_COMMUNITY): Payer: Medicare (Managed Care)

## 2021-02-12 ENCOUNTER — Encounter (HOSPITAL_COMMUNITY): Payer: Self-pay | Admitting: Emergency Medicine

## 2021-02-12 ENCOUNTER — Other Ambulatory Visit: Payer: Self-pay

## 2021-02-12 ENCOUNTER — Observation Stay (HOSPITAL_COMMUNITY)
Admission: EM | Admit: 2021-02-12 | Discharge: 2021-02-13 | Disposition: A | Discharge status: Home / Self Care | Payer: Medicare (Managed Care) | Attending: Internal Medicine | Admitting: Internal Medicine

## 2021-02-12 ENCOUNTER — Emergency Department (HOSPITAL_BASED_OUTPATIENT_CLINIC_OR_DEPARTMENT_OTHER): Payer: Medicare (Managed Care)

## 2021-02-12 DIAGNOSIS — I10 Essential (primary) hypertension: Secondary | ICD-10-CM

## 2021-02-12 DIAGNOSIS — Z7982 Long term (current) use of aspirin: Secondary | ICD-10-CM | POA: Insufficient documentation

## 2021-02-12 DIAGNOSIS — M79602 Pain in left arm: Secondary | ICD-10-CM | POA: Diagnosis present

## 2021-02-12 DIAGNOSIS — R778 Other specified abnormalities of plasma proteins: Principal | ICD-10-CM | POA: Insufficient documentation

## 2021-02-12 DIAGNOSIS — E119 Type 2 diabetes mellitus without complications: Secondary | ICD-10-CM

## 2021-02-12 DIAGNOSIS — R7989 Other specified abnormal findings of blood chemistry: Secondary | ICD-10-CM | POA: Diagnosis present

## 2021-02-12 DIAGNOSIS — G8929 Other chronic pain: Secondary | ICD-10-CM

## 2021-02-12 DIAGNOSIS — I129 Hypertensive chronic kidney disease with stage 1 through stage 4 chronic kidney disease, or unspecified chronic kidney disease: Secondary | ICD-10-CM | POA: Diagnosis not present

## 2021-02-12 DIAGNOSIS — N1831 Chronic kidney disease, stage 3a: Secondary | ICD-10-CM | POA: Insufficient documentation

## 2021-02-12 DIAGNOSIS — E785 Hyperlipidemia, unspecified: Secondary | ICD-10-CM

## 2021-02-12 DIAGNOSIS — M25512 Pain in left shoulder: Secondary | ICD-10-CM | POA: Diagnosis present

## 2021-02-12 DIAGNOSIS — Z87891 Personal history of nicotine dependence: Secondary | ICD-10-CM | POA: Insufficient documentation

## 2021-02-12 DIAGNOSIS — Z7984 Long term (current) use of oral hypoglycemic drugs: Secondary | ICD-10-CM | POA: Insufficient documentation

## 2021-02-12 DIAGNOSIS — E1122 Type 2 diabetes mellitus with diabetic chronic kidney disease: Secondary | ICD-10-CM | POA: Diagnosis not present

## 2021-02-12 DIAGNOSIS — N182 Chronic kidney disease, stage 2 (mild): Secondary | ICD-10-CM | POA: Diagnosis not present

## 2021-02-12 DIAGNOSIS — Z79899 Other long term (current) drug therapy: Secondary | ICD-10-CM | POA: Diagnosis not present

## 2021-02-12 LAB — CBC WITH DIFFERENTIAL/PLATELET
Abs Immature Granulocytes: 0.02 10*3/uL (ref 0.00–0.07)
Basophils Absolute: 0 10*3/uL (ref 0.0–0.1)
Basophils Relative: 0 %
Eosinophils Absolute: 0.1 10*3/uL (ref 0.0–0.5)
Eosinophils Relative: 3 %
HCT: 41.8 % (ref 36.0–46.0)
Hemoglobin: 13.5 g/dL (ref 12.0–15.0)
Immature Granulocytes: 0 %
Lymphocytes Relative: 29 %
Lymphs Abs: 1.5 10*3/uL (ref 0.7–4.0)
MCH: 30.5 pg (ref 26.0–34.0)
MCHC: 32.3 g/dL (ref 30.0–36.0)
MCV: 94.4 fL (ref 80.0–100.0)
Monocytes Absolute: 0.5 10*3/uL (ref 0.1–1.0)
Monocytes Relative: 10 %
Neutro Abs: 3 10*3/uL (ref 1.7–7.7)
Neutrophils Relative %: 58 %
Platelets: 257 10*3/uL (ref 150–400)
RBC: 4.43 MIL/uL (ref 3.87–5.11)
RDW: 13.3 % (ref 11.5–15.5)
WBC: 5.2 10*3/uL (ref 4.0–10.5)
nRBC: 0 % (ref 0.0–0.2)

## 2021-02-12 LAB — TROPONIN I (HIGH SENSITIVITY)
Troponin I (High Sensitivity): 53 ng/L — ABNORMAL HIGH (ref <18)
Troponin I (High Sensitivity): 42 ng/L — ABNORMAL HIGH (ref <18)

## 2021-02-12 LAB — BASIC METABOLIC PANEL
Anion gap: 10 (ref 5–15)
BUN: 21 mg/dL (ref 8–23)
CO2: 24 mmol/L (ref 22–32)
Calcium: 9.2 mg/dL (ref 8.9–10.3)
Chloride: 104 mmol/L (ref 98–111)
Creatinine, Ser: 1.31 mg/dL — ABNORMAL HIGH (ref 0.44–1.00)
GFR, Estimated: 41 mL/min — ABNORMAL LOW (ref >60)
Glucose, Bld: 106 mg/dL — ABNORMAL HIGH (ref 70–99)
Potassium: 4.4 mmol/L (ref 3.5–5.1)
Sodium: 138 mmol/L (ref 135–145)

## 2021-02-12 LAB — GLUCOSE, CAPILLARY: Glucose-Capillary: 108 mg/dL — ABNORMAL HIGH (ref 70–99)

## 2021-02-12 MED ORDER — NITROGLYCERIN 0.4 MG SL SUBL
0.4000 mg | SUBLINGUAL_TABLET | SUBLINGUAL | Status: DC | PRN
  Administered 2021-02-12: 0.4 mg via SUBLINGUAL
  Filled 2021-02-12: qty 1

## 2021-02-12 MED ORDER — HYDROCODONE-ACETAMINOPHEN 5-325 MG PO TABS
1.0000 | ORAL_TABLET | ORAL | Status: DC | PRN
Start: 2021-02-12 — End: 2021-02-12
  Administered 2021-02-12: 2 via ORAL
  Filled 2021-02-12: qty 2

## 2021-02-12 MED ORDER — SODIUM CHLORIDE 0.9% FLUSH: 3.0000 mL | INTRAVENOUS | Status: DC | PRN

## 2021-02-12 MED ORDER — SODIUM CHLORIDE 0.9% FLUSH
3.0000 mL | Freq: Two times a day (BID) | INTRAVENOUS | Status: DC
  Administered 2021-02-12: 3 mL via INTRAVENOUS

## 2021-02-12 MED ORDER — HYDROCODONE-ACETAMINOPHEN 5-325 MG PO TABS
1.0000 | ORAL_TABLET | ORAL | Status: DC | PRN
Start: 2021-02-12 — End: 2021-02-13
  Administered 2021-02-13 (×2): 1 via ORAL
  Filled 2021-02-12 (×2): qty 1

## 2021-02-12 MED ORDER — METOPROLOL SUCCINATE ER 50 MG PO TB24
50.0000 mg | ORAL_TABLET | Freq: Every day | ORAL | Status: DC
  Administered 2021-02-13: 50 mg via ORAL
  Filled 2021-02-12: qty 1

## 2021-02-12 MED ORDER — ASPIRIN 81 MG PO CHEW
324.0000 mg | CHEWABLE_TABLET | Freq: Once | ORAL | Status: AC
  Administered 2021-02-12: 324 mg via ORAL
  Filled 2021-02-12: qty 4

## 2021-02-12 MED ORDER — SODIUM CHLORIDE 0.9 % IV SOLN: 250.0000 mL | INTRAVENOUS | Status: DC | PRN

## 2021-02-12 MED ORDER — CHLORTHALIDONE 25 MG PO TABS
25.0000 mg | ORAL_TABLET | Freq: Every day | ORAL | Status: DC
  Administered 2021-02-13: 25 mg via ORAL
  Filled 2021-02-12 (×2): qty 1

## 2021-02-12 MED ORDER — LATANOPROST 0.005 % OP SOLN
1.0000 [drp] | Freq: Every day | OPHTHALMIC | Status: DC
  Filled 2021-02-12: qty 2.5

## 2021-02-12 MED ORDER — CYCLOBENZAPRINE HCL 5 MG PO TABS
5.0000 mg | ORAL_TABLET | Freq: Three times a day (TID) | ORAL | Status: DC | PRN
  Administered 2021-02-12: 5 mg via ORAL
  Filled 2021-02-12: qty 1

## 2021-02-12 MED ORDER — HYDROCODONE-ACETAMINOPHEN 5-325 MG PO TABS
1.0000 | ORAL_TABLET | Freq: Once | ORAL | Status: AC
Start: 2021-02-12 — End: 2021-02-12
  Administered 2021-02-12: 1 via ORAL
  Filled 2021-02-12: qty 1

## 2021-02-12 MED ORDER — ENOXAPARIN SODIUM 40 MG/0.4ML IJ SOSY
40.0000 mg | PREFILLED_SYRINGE | INTRAMUSCULAR | Status: DC
  Administered 2021-02-12: 40 mg via SUBCUTANEOUS
  Filled 2021-02-12: qty 0.4

## 2021-02-12 MED ORDER — INSULIN ASPART 100 UNIT/ML IJ SOLN
0.0000 [IU] | Freq: Three times a day (TID) | INTRAMUSCULAR | Status: DC
  Administered 2021-02-13: 1 [IU] via SUBCUTANEOUS

## 2021-02-12 MED ORDER — LIDOCAINE 5 % EX PTCH: 1.0000 | MEDICATED_PATCH | Freq: Every day | CUTANEOUS | Status: DC | PRN

## 2021-02-12 MED ORDER — ACETAMINOPHEN 650 MG RE SUPP: 650.0000 mg | Freq: Four times a day (QID) | RECTAL | Status: DC | PRN

## 2021-02-12 MED ORDER — AMLODIPINE BESYLATE 5 MG PO TABS
5.0000 mg | ORAL_TABLET | Freq: Every day | ORAL | Status: DC
  Administered 2021-02-13: 5 mg via ORAL
  Filled 2021-02-12 (×2): qty 1

## 2021-02-12 MED ORDER — ACETAMINOPHEN 325 MG PO TABS
650.0000 mg | ORAL_TABLET | Freq: Four times a day (QID) | ORAL | Status: DC | PRN
Start: 2021-02-12 — End: 2021-02-13

## 2021-02-12 MED ORDER — PRAVASTATIN SODIUM 10 MG PO TABS: 10.0000 mg | ORAL_TABLET | Freq: Every evening | ORAL | Status: DC

## 2021-02-12 MED ORDER — HYDRALAZINE HCL 10 MG PO TABS: 10.0000 mg | ORAL_TABLET | Freq: Three times a day (TID) | ORAL | Status: DC

## 2021-02-12 NOTE — ED Provider Notes (Signed)
[facility]    CSN: [id number] Arrival date & time: 02/11/21  1346      History   Chief Complaint Chief Complaint  Patient presents with   Shoulder Pain    HPI Brittany Robles is a 80 y.o. female.   Presenting today with localized left shoulder pain that radiates down to her elbow posteriorly that started 2 days ago.  The pain worsens at times with unclear trigger, currently severe and keeping her up at night.  She denies numbness, tingling, decreased range of motion, injury to the area, chest pain, shortness of breath, dizziness, palpitations, headache, swelling.  So far not trying anything over-the-counter for symptoms.  Only new change recently is her blood pressure medications were altered her blood pressure has been up slightly.  Does have a past history of chronic shoulder pain but states it felt very different than this   Past Medical History:  Diagnosis Date   Bowel obstruction ([place])    Carotid artery stenosis 03/08/2012   Bilateral- 40-50% per duplex 7/13.  Needs follow up duplex in 1 year.    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   Spinal stenosis of lumbar region     Patient Active Problem List   Diagnosis Date Noted   Shoulder injury, left, subsequent encounter 03/13/2017   Bilateral shoulder pain 01/27/2017   Glaucoma 08/01/2016   Osteopenia 04/27/2016   Insomnia 05/18/2015   Left Achilles tendinitis 03/09/2015   Irregular heart rate 01/12/2015   Essential hypertension 09/09/2014   Cervical disc disease 10/26/2012   Numbness of fingers of both hands 09/10/2012   Sciatica 06/08/2012   Routine gynecological examination 06/08/2012   Carotid artery stenosis 03/08/2012   Abnormal EKG 03/02/2012   DDD (degenerative disc disease), cervical  02/25/2012   Post-menopausal bleeding 11/25/2011   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   Galactorrhea of right breast 05/26/2011   Polycystic kidney 10/11/2009   Hyperlipidemia 08/16/2009   History of tobacco abuse 08/16/2009   RENAL DISEASE, CHRONIC, MILD 06/26/2009   Diabetes type 2, controlled ([place]) 09/25/2008   CARPAL TUNNEL SYNDROME 08/24/2008    Past Surgical History:  Procedure Laterality Date   ANTERIOR CERVICAL DECOMPRESSION/DISCECTOMY FUSION 4 LEVELS N/A 03/15/2013   Procedure: Cervical Three-Four Cervical Four-Five Cervical Five-Six Cervical Six-Seven Anterior cervical decompression/diskectomy/fusion;  Surgeon: Floyce Stakes, MD;  Location: [facility];  Service: Neurosurgery;  Laterality: N/A;  Cervical Three-Four Cervical Four-Five Cervical Five-Six Cervical Six-Seven Anterior cervical decompression/diskectomy/fusion   APPENDECTOMY     BREAST SURGERY     bx,   neg   CARPAL TUNNEL RELEASE     CESAREAN SECTION     x 2   SMALL INTESTINE SURGERY     for bowel obstruction   SPINE SURGERY     lumbar spinal stenosis    OB History   No obstetric history on file.      Home Medications    Prior to Admission medications   Medication Sig Start Date End Date Taking? Authorizing Provider  diclofenac Sodium (VOLTAREN) 1 % GEL Apply 2 g topically 4 (four) times daily. 02/11/21  Yes Volney American, PA-C  acetaminophen (TYLENOL) 325 MG tablet Take 650 mg by mouth every 6 (  six) hours as needed for pain or fever.     [provider]  amLODipine (NORVASC) 10 MG tablet TAKE ONE-HALF TABLET BY  MOUTH DAILY 07/22/19   Debbrah Alar, NP  aspirin EC 81 MG tablet Take 81 mg by mouth daily.    [provider]  atorvastatin (LIPITOR) 10 MG tablet TAKE 1 TABLET BY MOUTH  DAILY 12/08/17   Debbrah Alar, NP  Blood Glucose Monitoring Suppl (ONE TOUCH ULTRA MINI) w/Device KIT Use to check blood sugar once daily.  08/06/16   Debbrah Alar, NP  Calcium Carbonate-Vitamin D (CALTRATE 600+D) 600-400 MG-UNIT per tablet Take 1 tablet by mouth daily.    [provider]  cholecalciferol (VITAMIN D) 1000 units tablet Take 1,000 Units by mouth daily.    [provider]  diazepam (VALIUM) 5 MG tablet 1 tab PO 30 minutes prior to procedure - may repeat x1 03/12/17   Hudnall, Sharyn Lull, MD  glucose blood (ONE TOUCH ULTRA TEST) test strip TEST EVERY DAY 08/19/17   Debbrah Alar, NP  HYDROcodone-acetaminophen (NORCO) 5-325 MG tablet Take 1 tablet by mouth every 6 (six) hours as needed for moderate pain. 03/12/17   Dene Gentry, MD  Lancets (ONETOUCH ULTRASOFT) lancets TEST EVERY DAY 08/19/17   Debbrah Alar, NP  latanoprost (XALATAN) 0.005 % ophthalmic solution Place 1 drop into both eyes at bedtime. 05/23/16   [provider]  metoprolol succinate (TOPROL-XL) 50 MG 24 hr tablet TAKE 1 TABLET (50 MG TOTAL) BY MOUTH DAILY. TAKE WITH OR IMMEDIATELY FOLLOWING A MEAL. 10/27/18   Debbrah Alar, NP  nystatin (MYCOSTATIN/NYSTOP) powder Apply topically 2 (two) times daily as needed. 11/06/17   Debbrah Alar, NP  prednisoLONE acetate (PRED FORTE) 1 % ophthalmic suspension  09/25/16   [provider]  valACYclovir (VALTREX) 1000 MG tablet TAKE 1 TABLET BY MOUTH  DAILY AS NEEDED 07/20/18   Debbrah Alar, NP    Family History Family History  Problem Relation Age of Onset   Cancer Mother        lung   Cancer Sister        throat   Cancer Brother        lung   Cancer Other        Lung   CAD Son 81       stent    Social History Social History   Tobacco Use   Smoking status: Former    Pack years: 0.00    Types: Cigarettes    Quit date: 04/18/2010    Years since quitting: 10.8   Smokeless tobacco: Never  Substance Use Topics   Alcohol use: Yes    Alcohol/week: 2.0 standard drinks    Types: 2 Shots of liquor per week   Drug use: No     Allergies    Shellfish-derived products, Cozaar [losartan potassium], Medrol [methylprednisolone], Ibuprofen, and Lisinopril   Review of Systems Review of Systems Per HPI  Physical Exam Triage Vital Signs ED Triage Vitals  Enc Vitals Group     BP 02/11/21 1448 (!) 170/105     Pulse Rate 02/11/21 1448 (!) 130     Resp 02/11/21 1448 17     Temp 02/11/21 1448 98.9 F (37.2 C)     Temp Source 02/11/21 1448 Oral     SpO2 02/11/21 1448 100 %     Weight --      Height --      Head Circumference --  Peak Flow --      Pain Score 02/11/21 1446 10     Pain Loc --      Pain Edu? --      Excl. in [place]? --    No data found.  Updated Vital Signs BP (!) 170/105 (BP Location: Right Arm)   Pulse (!) 57   Temp 98.9 F (37.2 C) (Oral)   Resp 17   LMP 08/19/1983   SpO2 100%   Visual Acuity Right Eye Distance:   Left Eye Distance:   Bilateral Distance:    Right Eye Near:   Left Eye Near:    Bilateral Near:     Physical Exam Vitals and nursing note reviewed.  Constitutional:      Appearance: Normal appearance. She is not ill-appearing.  HENT:     Head: Atraumatic.  Eyes:     Extraocular Movements: Extraocular movements intact.     Conjunctiva/sclera: Conjunctivae normal.  Cardiovascular:     Rate and Rhythm: Normal rate.  Pulmonary:     Effort: Pulmonary effort is normal.     Breath sounds: Normal breath sounds.  Musculoskeletal:        General: Tenderness present. No swelling. Normal range of motion.     Cervical back: Normal range of motion and neck supple.     Comments: Mildly tender to palpation left SCM, trapezius pain not reproducible on palpation or range of motion exam Grip strength full and equal bilateral upper extremities  Skin:    General: Skin is warm and dry.     Coloration: Skin is not pale.     Findings: No erythema.  Neurological:     Mental Status: She is alert and oriented to person, place, and time.     Comments: Left upper extremity neurovascularly intact   Psychiatric:        Mood and Affect: Mood normal.        Thought Content: Thought content normal.        Judgment: Judgment normal.     UC Treatments / Results  Labs (all labs ordered are listed, but only abnormal results are displayed) Labs Reviewed - No data to display  EKG   Radiology No results found.  Procedures Procedures (including critical care time)  Medications Ordered in UC Medications  ketorolac (TORADOL) 30 MG/ML injection 30 mg (30 mg Intramuscular Given 02/11/21 1541)    Initial Impression / Assessment and Plan / UC Course  I have reviewed the triage vital signs and the nursing notes.  Pertinent labs & imaging results that were available during my care of the patient were reviewed by me and considered in my medical decision making (see chart for details).     Unclear etiology of patient's severe left shoulder pain.  Not 100% consistent with a muscular injury as she is had no inciting event and the pain is not reproducible with motion or palpation.  Her EKG shows frequent PACs and mild septal T wave changes which is only mildly changed from EKG from 2016 in chart.  Discussed with patient that I was unable to rule out more emergent causes of possible shoulder pain including upper extremity DVT, myocardial infarction that she would need to go to the emergency department for further evaluation of these things but patient declines today and wishes to try conservative management and will go to the emergency department if not improving.  IM Toradol given in clinic today, Voltaren gel sent home and discussed supportive home care.  Strict ED precautions given for acutely worsening symptoms.  Final Clinical Impressions(s) / UC Diagnoses   Final diagnoses:  Acute pain of left shoulder  Elevated blood pressure reading     Discharge Instructions      Go to the emergency room if your symptoms worsen or do not resolve     ED Prescriptions     Medication Sig  Dispense Auth. Provider   diclofenac Sodium (VOLTAREN) 1 % GEL Apply 2 g topically 4 (four) times daily. 150 g Volney American, [place]      PDMP not reviewed this encounter.   Volney American, [place] 02/12/21 1423

## 2021-02-12 NOTE — ED Provider Notes (Signed)
[facility] EMERGENCY DEPARTMENT Provider Note   CSN: [id number] Arrival date & time: 02/12/21  1105     History No chief complaint on file.   Brittany Robles is a 80 y.o. female.  HPI 80 year old female presents with left arm pain.  Started a couple days ago.  Has been waxing and waning ever since but pretty much constant.  Went to urgent care yesterday where she was prescribed a topical medicine that has not helped so she came into the ER.  The pain is primarily along her medial left upper arm.  From elbow to left trapezius.  No specific neck pain though there is the pain in the upper back.  No numbness or weakness in the extremity.  No chest pain or shortness of breath.  Nothing makes it better or worse including no range of motion of her shoulder or ambulation/exertion.  Pain is currently severe.  Past Medical History:  Diagnosis Date   Bowel obstruction ([place])    Carotid artery stenosis 03/08/2012   Bilateral- 40-50% per duplex 7/13.  Needs follow up duplex in 1 year.    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   Spinal stenosis of lumbar region     Patient Active Problem List   Diagnosis Date Noted   Shoulder injury, left, subsequent encounter 03/13/2017   Bilateral shoulder pain 01/27/2017   Glaucoma 08/01/2016   Osteopenia 04/27/2016   Insomnia 05/18/2015   Left Achilles tendinitis 03/09/2015   Irregular heart rate 01/12/2015   Essential hypertension 09/09/2014   Cervical disc disease 10/26/2012   Numbness of fingers of both hands 09/10/2012   Sciatica 06/08/2012   Routine gynecological examination 06/08/2012   Carotid artery stenosis 03/08/2012   Abnormal EKG 03/02/2012   DDD (degenerative disc disease), cervical 02/25/2012   Post-menopausal  bleeding 11/25/2011   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   Galactorrhea of right breast 05/26/2011   Polycystic kidney 10/11/2009   Hyperlipidemia 08/16/2009   History of tobacco abuse 08/16/2009   RENAL DISEASE, CHRONIC, MILD 06/26/2009   Diabetes type 2, controlled ([place]) 09/25/2008   CARPAL TUNNEL SYNDROME 08/24/2008    Past Surgical History:  Procedure Laterality Date   ANTERIOR CERVICAL DECOMPRESSION/DISCECTOMY FUSION 4 LEVELS N/A 03/15/2013   Procedure: Cervical Three-Four Cervical Four-Five Cervical Five-Six Cervical Six-Seven Anterior cervical decompression/diskectomy/fusion;  Surgeon: Floyce Stakes, MD;  Location: [facility];  Service: Neurosurgery;  Laterality: N/A;  Cervical Three-Four Cervical Four-Five Cervical Five-Six Cervical Six-Seven Anterior cervical decompression/diskectomy/fusion   APPENDECTOMY     BREAST SURGERY     bx,   neg   CARPAL TUNNEL RELEASE     CESAREAN SECTION     x 2   SMALL INTESTINE SURGERY     for bowel obstruction   SPINE SURGERY     lumbar spinal stenosis     OB History   No obstetric history on file.     Family History  Problem Relation Age of Onset   Cancer Mother        lung   Cancer Sister        throat   Cancer Brother        lung   Cancer Other  Lung   CAD Son 17       stent    Social History   Tobacco Use   Smoking status: Former    Pack years: 0.00    Types: Cigarettes    Quit date: 04/18/2010    Years since quitting: 10.8   Smokeless tobacco: Never  Substance Use Topics   Alcohol use: Yes    Alcohol/week: 2.0 standard drinks    Types: 2 Shots of liquor per week   Drug use: No    Home Medications Prior to Admission medications   Medication Sig Start Date End Date Taking? Authorizing Provider  acetaminophen (TYLENOL) 325 MG tablet Take 650 mg by mouth every 6 (six) hours as needed for pain or fever.     [provider]  amLODipine (NORVASC) 10 MG  tablet TAKE ONE-HALF TABLET BY  MOUTH DAILY 07/22/19   Debbrah Alar, NP  aspirin EC 81 MG tablet Take 81 mg by mouth daily.    [provider]  atorvastatin (LIPITOR) 10 MG tablet TAKE 1 TABLET BY MOUTH  DAILY 12/08/17   Debbrah Alar, NP  Blood Glucose Monitoring Suppl (ONE TOUCH ULTRA MINI) w/Device KIT Use to check blood sugar once daily. 08/06/16   Debbrah Alar, NP  Calcium Carbonate-Vitamin D (CALTRATE 600+D) 600-400 MG-UNIT per tablet Take 1 tablet by mouth daily.    [provider]  cholecalciferol (VITAMIN D) 1000 units tablet Take 1,000 Units by mouth daily.    [provider]  diazepam (VALIUM) 5 MG tablet 1 tab PO 30 minutes prior to procedure - may repeat x1 03/12/17   Hudnall, Sharyn Lull, MD  diclofenac Sodium (VOLTAREN) 1 % GEL Apply 2 g topically 4 (four) times daily. 02/11/21   Volney American, PA-C  glucose blood (ONE TOUCH ULTRA TEST) test strip TEST EVERY DAY 08/19/17   Debbrah Alar, NP  HYDROcodone-acetaminophen (NORCO) 5-325 MG tablet Take 1 tablet by mouth every 6 (six) hours as needed for moderate pain. 03/12/17   Dene Gentry, MD  Lancets (ONETOUCH ULTRASOFT) lancets TEST EVERY DAY 08/19/17   Debbrah Alar, NP  latanoprost (XALATAN) 0.005 % ophthalmic solution Place 1 drop into both eyes at bedtime. 05/23/16   [provider]  metoprolol succinate (TOPROL-XL) 50 MG 24 hr tablet TAKE 1 TABLET (50 MG TOTAL) BY MOUTH DAILY. TAKE WITH OR IMMEDIATELY FOLLOWING A MEAL. 10/27/18   Debbrah Alar, NP  nystatin (MYCOSTATIN/NYSTOP) powder Apply topically 2 (two) times daily as needed. 11/06/17   Debbrah Alar, NP  prednisoLONE acetate (PRED FORTE) 1 % ophthalmic suspension  09/25/16   [provider]  valACYclovir (VALTREX) 1000 MG tablet TAKE 1 TABLET BY MOUTH  DAILY AS NEEDED 07/20/18   Debbrah Alar, NP    Allergies    Shellfish-derived products, Cozaar [losartan potassium], Medrol  [methylprednisolone], Ibuprofen, and Lisinopril  Review of Systems   Review of Systems  Constitutional:  Negative for fever.  Respiratory:  Negative for shortness of breath.   Cardiovascular:  Negative for chest pain.  Musculoskeletal:  Positive for back pain and myalgias. Negative for neck pain.  Neurological:  Negative for weakness and numbness.  All other systems reviewed and are negative.  Physical Exam Updated Vital Signs BP (!) 185/73   Pulse (!) 46   Temp 98.3 F (36.8 C) (Oral)   Resp 17   LMP 08/19/1983   SpO2 98%   Physical Exam Vitals and nursing note reviewed.  Constitutional:      General: She  is not in acute distress.    Appearance: She is well-developed. She is not ill-appearing.  HENT:     Head: Normocephalic and atraumatic.     Right Ear: External ear normal.     Left Ear: External ear normal.     Nose: Nose normal.  Eyes:     General:        Right eye: No discharge.        Left eye: No discharge.  Cardiovascular:     Rate and Rhythm: Normal rate and regular rhythm.     Pulses:          Radial pulses are 2+ on the left side.     Heart sounds: Normal heart sounds.  Pulmonary:     Effort: Pulmonary effort is normal.     Breath sounds: Normal breath sounds.  Abdominal:     Palpations: Abdomen is soft.     Tenderness: There is no abdominal tenderness.  Musculoskeletal:     Comments: Normal ROM of left shoulder/elbow. Some tenderness along medial left upper arm. Tenderness along posterior shoulder/trapezius  Skin:    General: Skin is warm and dry.  Neurological:     Mental Status: She is alert.  Psychiatric:        Mood and Affect: Mood is not anxious.    ED Results / Procedures / Treatments   Labs (all labs ordered are listed, but only abnormal results are displayed) Labs Reviewed  BASIC METABOLIC PANEL - Abnormal; Notable for the following components:      Result Value   Glucose, Bld 106 (*)    Creatinine, Ser 1.31 (*)    GFR, Estimated 41  (*)    All other components within normal limits  TROPONIN I (HIGH SENSITIVITY) - Abnormal; Notable for the following components:   Troponin I (High Sensitivity) 53 (*)    All other components within normal limits  CBC WITH DIFFERENTIAL/PLATELET  TROPONIN I (HIGH SENSITIVITY)    EKG EKG Interpretation  Date/Time:  Tuesday February 12 2021 13:00:42 EDT Ventricular Rate:  57 PR Interval:  142 QRS Duration: 95 QT Interval:  421 QTC Calculation: 410 R Axis:   -40 Text Interpretation: Sinus rhythm Atrial premature complexes Probable left atrial enlargement Left axis deviation similar to February 11 2021 Confirmed by Sherwood Gambler [phone]) on 02/12/2021 1:02:21 PM  Radiology DG Chest 2 View  Result Date: 02/12/2021 CLINICAL DATA:  Back/left arm pain. EXAM: CHEST - 2 VIEW COMPARISON:  CT report 11/21/2017.  Chest x-ray 03/03/2013. FINDINGS: Mediastinum hilar structures normal. Cardiomegaly. No pulmonary venous congestion. Mild left mid lung subsegmental atelectasis and or scarring. No pleural effusion or pneumothorax. Prior cervical spine fusion. Degenerative change thoracic spine and both shoulders. IMPRESSION: 1.  Cardiomegaly.  No pulmonary venous congestion. 2. Mild left mid lung subsegmental atelectasis and or scarring. No acute pulmonary disease. 3. Degenerative change thoracic spine. Prior cervical spine fusion. Degenerative changes both shoulders. Electronically Signed   By: Marcello Moores  Register   On: 02/12/2021 14:27   DG Humerus Left  Result Date: 02/12/2021 CLINICAL DATA:  Left arm pain no injury EXAM: LEFT HUMERUS - 2+ VIEW COMPARISON:  03/12/2017 FINDINGS: Negative for fracture or bone lesion. Rotator cuff impingement with downward spurring of the acromion. IMPRESSION: Rotator cuff impingement.  No fracture identified. Electronically Signed   By: Franchot Gallo M.D.   On: 02/12/2021 14:28   UE VENOUS DUPLEX ([facility] 7 am - 7 pm)  Result Date: 02/12/2021 UPPER VENOUS  STUDY  Patient Name:   KASHARI CHALMERS  Date of Exam:   02/12/2021 Medical Rec #: [id number]       Accession #:    [id number] Date of Birth: 10-10-1940      Patient Gender: F Patient Age:   22Y Exam Location:  [facility] Procedure:      VAS [place] UPPER EXTREMITY VENOUS DUPLEX Referring Phys: [place] --------------------------------------------------------------------------------  Indications: Pain Comparison Study: no prior Performing Technologist: Archie Patten RVS  Examination Guidelines: A complete evaluation includes B-mode imaging, spectral Doppler, color Doppler, and power Doppler as needed of all accessible portions of each vessel. Bilateral testing is considered an integral part of a complete examination. Limited examinations for reoccurring indications may be performed as noted.  Right Findings: +----------+------------+---------+-----------+----------+-------+ RIGHT     CompressiblePhasicitySpontaneousPropertiesSummary +----------+------------+---------+-----------+----------+-------+ Subclavian    Full       Yes       Yes                      +----------+------------+---------+-----------+----------+-------+  Left Findings: +----------+------------+---------+-----------+----------+-------+ LEFT      CompressiblePhasicitySpontaneousPropertiesSummary +----------+------------+---------+-----------+----------+-------+ IJV           Full       Yes       Yes                      +----------+------------+---------+-----------+----------+-------+ Subclavian    Full       Yes       Yes                      +----------+------------+---------+-----------+----------+-------+ Axillary      Full       Yes       Yes                      +----------+------------+---------+-----------+----------+-------+ Brachial      Full       Yes       Yes                      +----------+------------+---------+-----------+----------+-------+ Radial        Full                                           +----------+------------+---------+-----------+----------+-------+ Ulnar         Full                                          +----------+------------+---------+-----------+----------+-------+ Cephalic      Full                                          +----------+------------+---------+-----------+----------+-------+ Basilic       Full                                          +----------+------------+---------+-----------+----------+-------+  Summary:  Right: No evidence of thrombosis in the subclavian.  Left: No evidence of deep vein thrombosis in the upper extremity. No evidence of superficial  vein thrombosis in the upper extremity. No evidence of thrombosis in the subclavian.  *See table(s) above for measurements and observations.  Diagnosing physician: Deitra Mayo MD Electronically signed by Deitra Mayo MD on 02/12/2021 at 2:52:58 PM.    Final     Procedures Procedures   Medications Ordered in ED Medications  nitroGLYCERIN (NITROSTAT) SL tablet 0.4 mg (0.4 mg Sublingual Given 02/12/21 1507)  HYDROcodone-acetaminophen (NORCO/VICODIN) 5-325 MG per tablet 1 tablet (1 tablet Oral Given 02/12/21 1251)  aspirin chewable tablet 324 mg (324 mg Oral Given 02/12/21 1508)    ED Course  I have reviewed the triage vital signs and the nursing notes.  Pertinent labs & imaging results that were available during my care of the patient were reviewed by me and considered in my medical decision making (see chart for details).    MDM Rules/Calculators/A&P                          No significant findings on work-up for her left arm pain as far as the x-ray and ultrasound.  No DVT.  ECG without obvious acute ischemia but her troponin is elevated at 53.  This is nonspecific at this point and we will need at least the second troponin.  However with her age and hypertension and atypical symptoms I think she should be admitted.  I have consulted cardiology who will  see and asked to hold off on heparin until they consult.  Requesting medical admission.  Discussed with hospitalist, Dr. Rogers Blocker for admission. Final Clinical Impression(s) / ED Diagnoses Final diagnoses:  Elevated troponin    Rx / DC Orders ED Discharge Orders     None        Sherwood Gambler, MD 02/12/21 1545

## 2021-02-12 NOTE — ED Notes (Signed)
Patient transported to X-ray 

## 2021-02-12 NOTE — ED Triage Notes (Addendum)
Pt reports L shoulder/ shoulder blade pain that radiates to L elbow x 3 days.  Seen at [facility] yesterday for same and states she isn't any better.  Denies SOB and chest pain.  EKG completed at [facility] yesterday.

## 2021-02-12 NOTE — ED Notes (Signed)
Pt has 2+ left radial pulse, cap refill less than 3 sec, 4/5 left grip strength. Pt has full ROM of left shoulder.

## 2021-02-12 NOTE — Consult Note (Signed)
Cardiology Consultation:   Patient ID: ODA PLACKE MRN: [id number]; DOB: [birth year]  Admit date: 02/12/2021 Date of Consult: 02/12/2021  PCP:  Debbrah Alar, NP   [facility] Providers Cardiologist:  Remotely seen in 2013; new to Dr. Gardiner Rhyme     Patient Profile:   Brittany Robles is a 80 y.o. female with a PMH of HTN, HLD, DM type 2, carotid artery stenosis,  who is being seen 02/12/2021 for the evaluation of elevated troponins at the request of Dr. Regenia Skeeter.  History of Present Illness:   Brittany Robles was in her usual state of health until 3 days ago when she began experiencing intermittent left shoulder pain. She denied any inciting factors/injury to the area. She reported pain radiating to her left elbow, ultimately interfering with her sleep and prompting her to present to an urgent care facility 02/11/21. EKG was felt to be non-ischemic. She was given IM toradol and voltaren gel, as well as strict ED precautions if symptoms worsened, and sent home. She returned to the ED today for the same complaints.   ED course: hypertensive and bradycardic to he 50s, otherwise VSS. Labs notable for electrolytes wnl, Cr 1.3 (1.1 in 2019), CBC wnl, HsTrop 53>42. EKG sinus rhythm, rate 63 bpm, PACs, no STE/D, no TWI. CXR showed cardiomegaly without pulmonary venous congestion. Left humerus XR showed rotator cuff impingement without fracture. LUE doppler showed no evidence of DVT. Cardiology asked to evaluate for elevated troponins.  She has seen cardiology remotely in 2013 and underwent a NST at that time which showed normal LV function and no evidence of ischemia. She has not had any invasive cardiac testing, though CT Chest in 2019 showed dense left coronary artery calcifications and aortic atherosclerosis.   At the time of this evaluation she is resting comfortably in bed. She reports being fairly active at baseline - walking regularly, going dancing, and spending time with her  grandkids/great-grandkids. She denies any trouble with chest pain at rest or with exertion, SOB, DOE, palpitations, LE edema, orthopnea, PND, dizziness, lightheadedness, or syncope. She reports seeing her PCP 1 week ago and was started on an additional blood pressure medication though cannot recall the name, as well as being restarted on a statin as she states her cholesterol was high. She reports family history of CAD in her son who had an MI at 51, otherwise no other CAD or CHF. She reports occasional social drinking and prior tobacco abuse (quit 13 years ago after smoking <1ppd for 40+ years). She denies recreational drugs.    Past Medical History:  Diagnosis Date   Bowel obstruction ([place])    Carotid artery stenosis 03/08/2012   Bilateral- 40-50% per duplex 7/13.  Needs follow up duplex in 1 year.    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   Spinal stenosis of lumbar region     Past Surgical History:  Procedure Laterality Date   ANTERIOR CERVICAL DECOMPRESSION/DISCECTOMY FUSION 4 LEVELS N/A 03/15/2013   Procedure: Cervical Three-Four Cervical Four-Five Cervical Five-Six Cervical Six-Seven Anterior cervical decompression/diskectomy/fusion;  Surgeon: Floyce Stakes, MD;  Location: [facility];  Service: Neurosurgery;  Laterality: N/A;  Cervical Three-Four Cervical Four-Five Cervical Five-Six Cervical Six-Seven Anterior cervical decompression/diskectomy/fusion   APPENDECTOMY     BREAST  SURGERY     bx,   neg   CARPAL TUNNEL RELEASE     CESAREAN SECTION     x 2   SMALL INTESTINE SURGERY     for bowel obstruction   SPINE SURGERY     lumbar spinal stenosis     Home Medications:  Prior to Admission medications   Medication Sig Start Date End Date Taking? Authorizing Provider   acetaminophen (TYLENOL) 325 MG tablet Take 650 mg by mouth every 6 (six) hours as needed for pain or fever.     [provider]  amLODipine (NORVASC) 10 MG tablet TAKE ONE-HALF TABLET BY  MOUTH DAILY 07/22/19   Debbrah Alar, NP  aspirin EC 81 MG tablet Take 81 mg by mouth daily.    [provider]  atorvastatin (LIPITOR) 10 MG tablet TAKE 1 TABLET BY MOUTH  DAILY 12/08/17   Debbrah Alar, NP  Blood Glucose Monitoring Suppl (ONE TOUCH ULTRA MINI) w/Device KIT Use to check blood sugar once daily. 08/06/16   Debbrah Alar, NP  Calcium Carbonate-Vitamin D (CALTRATE 600+D) 600-400 MG-UNIT per tablet Take 1 tablet by mouth daily.    [provider]  cholecalciferol (VITAMIN D) 1000 units tablet Take 1,000 Units by mouth daily.    [provider]  diazepam (VALIUM) 5 MG tablet 1 tab PO 30 minutes prior to procedure - may repeat x1 03/12/17   Hudnall, Sharyn Lull, MD  diclofenac Sodium (VOLTAREN) 1 % GEL Apply 2 g topically 4 (four) times daily. 02/11/21   Volney American, PA-C  glucose blood (ONE TOUCH ULTRA TEST) test strip TEST EVERY DAY 08/19/17   Debbrah Alar, NP  HYDROcodone-acetaminophen (NORCO) 5-325 MG tablet Take 1 tablet by mouth every 6 (six) hours as needed for moderate pain. 03/12/17   Dene Gentry, MD  Lancets (ONETOUCH ULTRASOFT) lancets TEST EVERY DAY 08/19/17   Debbrah Alar, NP  latanoprost (XALATAN) 0.005 % ophthalmic solution Place 1 drop into both eyes at bedtime. 05/23/16   [provider]  metoprolol succinate (TOPROL-XL) 50 MG 24 hr tablet TAKE 1 TABLET (50 MG TOTAL) BY MOUTH DAILY. TAKE WITH OR IMMEDIATELY FOLLOWING A MEAL. 10/27/18   Debbrah Alar, NP  nystatin (MYCOSTATIN/NYSTOP) powder Apply topically 2 (two) times daily as needed. 11/06/17   Debbrah Alar, NP  prednisoLONE acetate (PRED FORTE) 1 % ophthalmic suspension  09/25/16   [provider]  valACYclovir (VALTREX) 1000 MG tablet  TAKE 1 TABLET BY MOUTH  DAILY AS NEEDED 07/20/18   Debbrah Alar, NP    Inpatient Medications: Scheduled Meds:  Continuous Infusions:  PRN Meds: nitroGLYCERIN  Allergies:    Allergies  Allergen Reactions   Shellfish-Derived Products Swelling   Cozaar [Losartan Potassium] Other (See Comments)    hyperkalemia   Medrol [Methylprednisolone] Other (See Comments)    Near syncope, low BP   Ibuprofen Other (See Comments)    Strange feelings and dreams   Lisinopril Other (See Comments)    Unknown reaction    Social History:   Social History   Socioeconomic History   Marital status: Divorced    Spouse name: Not on file   Number of children: 3   Years of education: Not on file   Highest education level: Not on file  Occupational History   Occupation: Retired  Tobacco Use   Smoking status: Former    Pack years: 0.00    Types: Cigarettes    Quit date: 04/18/2010    Years since quitting: 60.8  Smokeless tobacco: Never  Substance and Sexual Activity   Alcohol use: Yes    Alcohol/week: 2.0 standard drinks    Types: 2 Shots of liquor per week   Drug use: No   Sexual activity: Yes  Other Topics Concern   Not on file  Social History Narrative   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   All other ROS reviewed and negative.     Physical Exam/Data:   Vitals:   02/12/21 1230  02/12/21 1255 02/12/21 1400 02/12/21 1445  BP: (!) 155/65 (!) 173/88 (!) 175/77 (!) 185/73  Pulse: (!) 51 65 (!) 48 (!) 46  Resp: _0 Temp:      TempSrc:      SpO2: 98% 99% 96% 98%   No intake or output data in the 24 hours ending 02/12/21 1513 Last 3 Weights 05/11/2018 05/11/2018 04/01/2018  Weight (lbs) 124 lb 124 lb 6.4 oz 124 lb  Weight (kg) 56.246 kg 56.427 kg 56.246 kg     There is no height or weight on file to calculate BMI.  General:  Well nourished, well developed, in no acute distress HEENT: sclera anicteric Neck: no JVD Vascular: No carotid bruits; distal pulses 2+  Cardiac:  normal S1, S2; RRR; no murmurs, rubs, or gallops Lungs:  clear to auscultation bilaterally, no wheezing, rhonchi or rales  Abd: soft, nontender, no hepatomegaly  Ext: no edema Musculoskeletal:  No deformities, BUE and BLE strength normal and equal, +TTP of left shoulder and scapular region. Mildly limited LUE ROM Skin: warm and dry  Neuro:  CNs 2-12 intact, no focal abnormalities noted Psych:  Normal affect   EKG:  The EKG was personally reviewed and demonstrates:  sinus rhythm, rate 63 bpm, PACs, no STE/D, no TWI Telemetry:  Telemetry was personally reviewed and demonstrates:  sinus rhythm with occasional PACs  Relevant CV Studies: NST 2013: Overall Impression:  Normal stress nuclear study.   No evidence of ischemia.  Normal LV function.   LV Ejection Fraction: Study not gated.  LV Wall Motion:  Study not gated.  Laboratory Data:  High Sensitivity Troponin:   Recent Labs  Lab 02/12/21 1300  TROPONINIHS 53*     Chemistry Recent Labs  Lab 02/12/21 1300  NA 138  K 4.4  CL 104  CO2 24  GLUCOSE 106*  BUN 21  CREATININE 1.31*  CALCIUM 9.2  GFRNONAA 41*  ANIONGAP 10    No results for input(s): PROT, ALBUMIN, AST, ALT, ALKPHOS, BILITOT in the last 168 hours. Hematology Recent Labs  Lab 02/12/21 1300  WBC 5.2  RBC 4.43  HGB 13.5  HCT 41.8  MCV 94.4  MCH 30.5  MCHC  32.3  RDW 13.3  PLT 257   BNPNo results for input(s): BNP, PROBNP in the last 168 hours.  DDimer No results for input(s): DDIMER in the last 168 hours.   Radiology/Studies:  DG Chest 2 View  Result Date: 02/12/2021 CLINICAL DATA:  Back/left arm pain. EXAM: CHEST - 2 VIEW COMPARISON:  CT report 11/21/2017.  Chest x-ray 03/03/2013. FINDINGS: Mediastinum hilar structures normal. Cardiomegaly. No pulmonary venous congestion. Mild left mid lung subsegmental atelectasis and or scarring. No pleural effusion or pneumothorax. Prior cervical spine fusion. Degenerative change thoracic spine and both shoulders. IMPRESSION: 1.  Cardiomegaly.  No pulmonary venous congestion. 2. Mild left mid lung subsegmental atelectasis and or scarring. No acute pulmonary disease. 3. Degenerative change thoracic spine. Prior cervical spine fusion. Degenerative changes both shoulders. Electronically Signed   By: Marcello Moores  Register   On: 02/12/2021 14:27   DG Humerus Left  Result Date: 02/12/2021 CLINICAL DATA:  Left arm pain no injury EXAM: LEFT HUMERUS - 2+ VIEW COMPARISON:  03/12/2017 FINDINGS: Negative for fracture or bone lesion. Rotator cuff impingement with downward spurring of the acromion. IMPRESSION: Rotator cuff impingement.  No fracture identified. Electronically Signed   By: Franchot Gallo M.D.   On: 02/12/2021 14:28   UE VENOUS DUPLEX [facility] 7 am - 7 pm)  Result Date: 02/12/2021 UPPER VENOUS STUDY  Patient Name:  SHERICA PATERNOSTRO [place]  Date of Exam:   02/12/2021 Medical Rec #: [id number]       Accession #:    [id number] Date of Birth: 08-01-1941      Patient Gender: F Patient Age:   64Y Exam Location:  [facility] Procedure:      VAS [place] UPPER EXTREMITY VENOUS DUPLEX Referring Phys: [place] --------------------------------------------------------------------------------  Indications: Pain Comparison Study: no prior Performing Technologist: Archie Patten RVS  Examination Guidelines: A complete  evaluation includes B-mode imaging, spectral Doppler, color Doppler, and power Doppler as needed of all accessible portions of each vessel. Bilateral testing is considered an integral part of a complete examination. Limited examinations for reoccurring indications may be performed as noted.  Right Findings: +----------+------------+---------+-----------+----------+-------+ RIGHT     CompressiblePhasicitySpontaneousPropertiesSummary +----------+------------+---------+-----------+----------+-------+ Subclavian    Full       Yes       Yes                      +----------+------------+---------+-----------+----------+-------+  Left Findings: +----------+------------+---------+-----------+----------+-------+ LEFT      CompressiblePhasicitySpontaneousPropertiesSummary +----------+------------+---------+-----------+----------+-------+ IJV           Full       Yes       Yes                      +----------+------------+---------+-----------+----------+-------+ Subclavian    Full       Yes       Yes                      +----------+------------+---------+-----------+----------+-------+ Axillary      Full       Yes       Yes                      +----------+------------+---------+-----------+----------+-------+ Brachial      Full       Yes       Yes                      +----------+------------+---------+-----------+----------+-------+  Radial        Full                                          +----------+------------+---------+-----------+----------+-------+ Ulnar         Full                                          +----------+------------+---------+-----------+----------+-------+ Cephalic      Full                                          +----------+------------+---------+-----------+----------+-------+ Basilic       Full                                          +----------+------------+---------+-----------+----------+-------+  Summary:   Right: No evidence of thrombosis in the subclavian.  Left: No evidence of deep vein thrombosis in the upper extremity. No evidence of superficial vein thrombosis in the upper extremity. No evidence of thrombosis in the subclavian.  *See table(s) above for measurements and observations.  Diagnosing physician: Deitra Mayo MD Electronically signed by Deitra Mayo MD on 02/12/2021 at 2:52:58 PM.    Final      Assessment and Plan:   1. Elevated troponin in patient with coronary artery calcifications on CT: patient presented with left shoulder pain radiating to her left elbow for the past 3 days. No complaints of chest pain or SOB. EKG non-ischemic. HsTrop elevated to 53>42. CXR showed cardiomegaly, otherwise no acute findings. Left humerus XR showed rotator cuff impingement. LUE doppler negative for DVT. She does have known coronary artery calcifications noted on CT in 2019, as well as history of carotid artery disease (mild right, moderate left), HTN, HLD, and DM type 2. Last ischemic evaluation was a NST in 2013 which was without ischemia. Possible trop bump is demand ischemia in the setting of HTN.  - Will check an echocardiogram to evaluate LV function, wall motion, and valve function - Could consider an outpatient ischemic evaluation if echo is reassuring - Continue aspirin and statin - Continue BBlocker  2. HTN: BP elevated with SBP up to 185.  - Continue home amlodipine and metoprolol succinate - Will need pharmacy to verify additional home antihypertensive started by PCP 1 week ago - Could consider transition from metoprolol to carvedilol for improved BP control - bradycardia limits titration  3. HLD: No recent lipids on file, states she had lipids checked 1 week ago which were elevated. Restarted on statin - Continue atorvastatin  4. DM type 2: No recent A1C on file - Continue dietary modifications to maintain goal A1C <7  5. Carotid artery disease: noted to have mild right  ICA and moderate left ICA stenosis on last dopplers from 2018. No symptoms to suggest significant change - Would update carotid dopplers outpatient for surveillance monitoring. - Continue aspirin and statin  6. Left shoulder pain: symptoms seem consistent with MSK etiology. Suspect rotator cuff etiology.  - Continue pain management per primary team   Risk Assessment/Risk Scores:        For questions  or updates, please contact [place] Please consult [email or web address] for contact info under    Signed, Abigail Butts, PA-C  02/12/2021 3:13 PM

## 2021-02-12 NOTE — Progress Notes (Signed)
Upper extremity venous has been completed.   Preliminary results in CV Proc.   Blanch Media 02/12/2021 2:38 PM
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
## 2021-02-13 ENCOUNTER — Telehealth: Payer: Self-pay | Admitting: Family

## 2021-02-13 ENCOUNTER — Observation Stay (HOSPITAL_BASED_OUTPATIENT_CLINIC_OR_DEPARTMENT_OTHER): Payer: Medicare (Managed Care)

## 2021-02-13 DIAGNOSIS — E1129 Type 2 diabetes mellitus with other diabetic kidney complication: Secondary | ICD-10-CM | POA: Diagnosis not present

## 2021-02-13 DIAGNOSIS — R079 Chest pain, unspecified: Secondary | ICD-10-CM | POA: Diagnosis not present

## 2021-02-13 DIAGNOSIS — I10 Essential (primary) hypertension: Secondary | ICD-10-CM | POA: Diagnosis not present

## 2021-02-13 DIAGNOSIS — I340 Nonrheumatic mitral (valve) insufficiency: Secondary | ICD-10-CM | POA: Diagnosis not present

## 2021-02-13 DIAGNOSIS — I361 Nonrheumatic tricuspid (valve) insufficiency: Secondary | ICD-10-CM

## 2021-02-13 DIAGNOSIS — R778 Other specified abnormalities of plasma proteins: Secondary | ICD-10-CM | POA: Diagnosis not present

## 2021-02-13 DIAGNOSIS — E785 Hyperlipidemia, unspecified: Secondary | ICD-10-CM | POA: Diagnosis not present

## 2021-02-13 DIAGNOSIS — N182 Chronic kidney disease, stage 2 (mild): Secondary | ICD-10-CM | POA: Diagnosis not present

## 2021-02-13 LAB — LIPID PANEL
Cholesterol: 146 mg/dL (ref 0–200)
HDL: 57 mg/dL (ref >40)
LDL Cholesterol: 75 mg/dL (ref 0–99)
Total CHOL/HDL Ratio: 2.6 RATIO
Triglycerides: 70 mg/dL (ref <150)
VLDL: 14 mg/dL (ref 0–40)

## 2021-02-13 LAB — BASIC METABOLIC PANEL
Anion gap: 8 (ref 5–15)
BUN: 21 mg/dL (ref 8–23)
CO2: 22 mmol/L (ref 22–32)
Calcium: 8.7 mg/dL — ABNORMAL LOW (ref 8.9–10.3)
Chloride: 105 mmol/L (ref 98–111)
Creatinine, Ser: 1.32 mg/dL — ABNORMAL HIGH (ref 0.44–1.00)
GFR, Estimated: 41 mL/min — ABNORMAL LOW (ref >60)
Glucose, Bld: 76 mg/dL (ref 70–99)
Potassium: 3.8 mmol/L (ref 3.5–5.1)
Sodium: 135 mmol/L (ref 135–145)

## 2021-02-13 LAB — GLUCOSE, CAPILLARY
Glucose-Capillary: 131 mg/dL — ABNORMAL HIGH (ref 70–99)
Glucose-Capillary: 86 mg/dL (ref 70–99)

## 2021-02-13 LAB — ECHOCARDIOGRAM COMPLETE
Area-P 1/2: 2.72 cm2
Height: 59 in
S' Lateral: 2.3 cm
Weight: 2056 oz

## 2021-02-13 MED ORDER — CYCLOBENZAPRINE HCL 5 MG PO TABS: 5.0000 mg | ORAL_TABLET | Freq: Three times a day (TID) | ORAL | 0 refills | Status: AC | PRN

## 2021-02-13 MED ORDER — DICLOFENAC SODIUM 1 % EX GEL
2.0000 g | Freq: Four times a day (QID) | CUTANEOUS | Status: DC
  Administered 2021-02-13: 2 g via TOPICAL
  Filled 2021-02-13: qty 100

## 2021-02-13 MED ORDER — DICLOFENAC SODIUM 1 % EX GEL: 2.0000 g | Freq: Four times a day (QID) | CUTANEOUS | 0 refills | Status: AC

## 2021-02-13 MED ORDER — CHLORTHALIDONE 25 MG PO TABS: 25.0000 mg | ORAL_TABLET | Freq: Every day | ORAL | 0 refills | Status: AC

## 2021-02-13 MED ORDER — AMLODIPINE BESYLATE 5 MG PO TABS: 5.0000 mg | ORAL_TABLET | Freq: Every day | ORAL | 0 refills | Status: AC

## 2021-02-13 MED ORDER — ENOXAPARIN SODIUM 30 MG/0.3ML IJ SOSY: 30.0000 mg | PREFILLED_SYRINGE | INTRAMUSCULAR | Status: DC

## 2021-02-13 MED ORDER — TRAMADOL HCL 50 MG PO TABS: 50.0000 mg | ORAL_TABLET | Freq: Four times a day (QID) | ORAL | 0 refills | Status: AC | PRN

## 2021-02-13 MED ORDER — TRAMADOL HCL 50 MG PO TABS: 50.0000 mg | ORAL_TABLET | Freq: Four times a day (QID) | ORAL | Status: DC | PRN

## 2021-02-13 NOTE — Progress Notes (Signed)
D/C instructions given and reviewed. Tele and IV removed, tolerated well. Awaiting family to transport home. 

## 2021-02-13 NOTE — Telephone Encounter (Signed)
Noted, will call patient tomorrow.

## 2021-02-13 NOTE — Discharge Summary (Signed)
Physician Discharge Summary  Brittany Robles YPP:[id number] DOB: 1940-09-06 DOA: 02/12/2021  PCP: Sandford Craze, NP  Admit date: 02/12/2021 Discharge date: 02/13/2021  Admitted From: Home  Disposition:  Home   Recommendations for Outpatient Follow-up and new medication changes:  Follow up with Sandford Craze in 7 to 10 days.  Patient ruled out for acute coronary syndrome. Her blood pressure regimen was adjusted with chlorthalidone, amlodipine and continue metoprolol. Added as needed tramadol and cyclobenzaprine for left shoulder pain.    Home Health: no   Equipment/Devices: no    Discharge Condition: stable  CODE STATUS: full  Diet recommendation:  heart healthy and diabetic prudent.   Brief/Interim Summary: Brittany Robles was admitted with the working diagnosis of chest pain to rule out acute coronary syndrome in the setting of uncontrolled HTN and left shoulder pain.   80 year old female past medical history for hypertension, type 2 diabetes mellitus, dyslipidemia, CKD stage 3a, osteoarthritis and left shoulder rotator cuff injury.  She presented with left arm pain, radiating to her back, between her shoulder blades.  Her pain was refractive to outpatient management with analgesics.  Because of persistent pain she came to the hospital.  On her initial physical examination blood pressure 170/105, heart rate 130, respiratory rate 17, afebrile, oxygen saturation 100%.  Her lungs are clear to auscultation bilaterally, heart S1-S2, present, regular, soft abdomen, no lower extremity edema.  Left shoulder tender to palpation, positive pain with passive movement.  Sodium 138, potassium 4.4, chloride 104, bicarb 24, glucose 106, BUN 21, creatinine 1.31, high sensitive troponin 53-42, white count 5.2, hemoglobin 13.5, hematocrit 41.8, platelets 257.  Chest radiograph with no infiltrates, left base atelectasis.  Left shoulder with rotator cuff impingement, no fracture.  EKG 57 bpm, left  axis deviation, left anterior fascicular block, normal intervals, sinus rhythm with poor R wave progression, no significant ST segment or T wave changes.  Patient was admitted for further blood pressure control and cardiac work-up.  Echocardiography with preserved LV function, no wall motion abnormalities. She ruled out for acute coronary syndrome.  Patient will need outpatient follow-up for left shoulder rotator cuff.  Uncontrolled hypertension/hypertensive urgency.  Patient has been placed on amlodipine and chlorthalidone, continue metoprolol.  Plan to follow-up as an outpatient.  2.  Type 2 diabetes mellitus, dyslipidemia/carotid artery disease..  Patient's glucose remained well controlled, continue glipizide at discharge.  3.  Chronic kidney disease stage III a.  Renal function remained stable.  At her discharge serum creatinine 1.32, with sodium 135, potassium 3.7, chloride 105, bicarb 22. Chlorthalidone has been added to her hypertensive regimen.  4.  Left rotator cuff.  Continue pain control with topical diclofenac, as needed tramadol and diclofenac. Will need outpatient follow-up.    Discharge Diagnoses:  Principal Problem:   Elevated troponin Active Problems:   Diabetes type 2, controlled (HCC)   Hyperlipidemia   RENAL DISEASE, CHRONIC, MILD   Essential hypertension   Left shoulder pain    Discharge Instructions   Allergies as of 02/13/2021       Reactions   Shellfish-derived Products Swelling   Cozaar [losartan Potassium] Other (See Comments)   hyperkalemia   Medrol [methylprednisolone] Other (See Comments)   Near syncope, low BP   Ibuprofen Other (See Comments)   Strange feelings and dreams   Lisinopril Other (See Comments)   Unknown reaction        Medication List     TAKE these medications    acetaminophen 325 MG tablet Commonly known  as: TYLENOL Take 650 mg by mouth every 6 (six) hours as needed for pain or fever.   amLODipine 5 MG  tablet Commonly known as: NORVASC Take 1 tablet (5 mg total) by mouth daily.   Calcium Carbonate-Vitamin D 600-400 MG-UNIT tablet Take 1 tablet by mouth daily.   chlorthalidone 25 MG tablet Commonly known as: HYGROTON Take 1 tablet (25 mg total) by mouth daily.   cholecalciferol 1000 units tablet Commonly known as: VITAMIN D Take 1,000 Units by mouth daily.   cyclobenzaprine 5 MG tablet Commonly known as: FLEXERIL Take 1 tablet (5 mg total) by mouth 3 (three) times daily as needed for muscle spasms.   diclofenac Sodium 1 % Gel Commonly known as: VOLTAREN Apply 2 g topically 4 (four) times daily. Apply to left shoulder.   glipiZIDE 2.5 MG 24 hr tablet Commonly known as: GLUCOTROL XL Take 2.5 mg by mouth daily.   latanoprost 0.005 % ophthalmic solution Commonly known as: XALATAN Place 1 drop into both eyes at bedtime.   metoprolol succinate 50 MG 24 hr tablet Commonly known as: TOPROL-XL TAKE 1 TABLET (50 MG TOTAL) BY MOUTH DAILY. TAKE WITH OR IMMEDIATELY FOLLOWING A MEAL. What changed: additional instructions   onetouch ultrasoft lancets TEST EVERY DAY   pravastatin 10 MG tablet Commonly known as: PRAVACHOL Take 10 mg by mouth every evening.   traMADol 50 MG tablet Commonly known as: ULTRAM Take 1 tablet (50 mg total) by mouth every 6 (six) hours as needed for severe pain (left shoulder pain).   valACYclovir 1000 MG tablet Commonly known as: VALTREX TAKE 1 TABLET BY MOUTH  DAILY AS NEEDED What changed: reasons to take this        Allergies  Allergen Reactions   Shellfish-Derived Products Swelling   Cozaar [Losartan Potassium] Other (See Comments)    hyperkalemia   Medrol [Methylprednisolone] Other (See Comments)    Near syncope, low BP   Ibuprofen Other (See Comments)    Strange feelings and dreams   Lisinopril Other (See Comments)    Unknown reaction    Consultations: Cardiology    Procedures/Studies: DG Chest 2 View  Result Date:  02/12/2021 CLINICAL DATA:  Back/left arm pain. EXAM: CHEST - 2 VIEW COMPARISON:  CT report 11/21/2017.  Chest x-ray 03/03/2013. FINDINGS: Mediastinum hilar structures normal. Cardiomegaly. No pulmonary venous congestion. Mild left mid lung subsegmental atelectasis and or scarring. No pleural effusion or pneumothorax. Prior cervical spine fusion. Degenerative change thoracic spine and both shoulders. IMPRESSION: 1.  Cardiomegaly.  No pulmonary venous congestion. 2. Mild left mid lung subsegmental atelectasis and or scarring. No acute pulmonary disease. 3. Degenerative change thoracic spine. Prior cervical spine fusion. Degenerative changes both shoulders. Electronically Signed   By: Maisie Fus  Register   On: 02/12/2021 14:27   DG Humerus Left  Result Date: 02/12/2021 CLINICAL DATA:  Left arm pain no injury EXAM: LEFT HUMERUS - 2+ VIEW COMPARISON:  03/12/2017 FINDINGS: Negative for fracture or bone lesion. Rotator cuff impingement with downward spurring of the acromion. IMPRESSION: Rotator cuff impingement.  No fracture identified. Electronically Signed   By: Marlan Palau M.D.   On: 02/12/2021 14:28   UE VENOUS DUPLEX [facility] 7 am - 7 pm)  Result Date: 02/12/2021 UPPER VENOUS STUDY  Patient Name:  Brittany Robles  Date of Exam:   02/12/2021 Medical Rec #: [id number]       Accession #:    [id number] Date of Birth: 1940-12-05      Patient  Gender: F Patient Age:   26Y Exam Location:  [facility] Procedure:      VAS [place] UPPER EXTREMITY VENOUS DUPLEX Referring Phys: [id number] SCOTT GOLDSTON --------------------------------------------------------------------------------  Indications: Pain Comparison Study: no prior Performing Technologist: [place] Ponder RVS  Examination Guidelines: A complete evaluation includes B-mode imaging, spectral Doppler, color Doppler, and power Doppler as needed of all accessible portions of each vessel. Bilateral testing is considered an integral part of a complete examination. Limited  examinations for reoccurring indications may be performed as noted.  Right Findings: +----------+------------+---------+-----------+----------+-------+ RIGHT     CompressiblePhasicitySpontaneousPropertiesSummary +----------+------------+---------+-----------+----------+-------+ Subclavian    Full       Yes       Yes                      +----------+------------+---------+-----------+----------+-------+  Left Findings: +----------+------------+---------+-----------+----------+-------+ LEFT      CompressiblePhasicitySpontaneousPropertiesSummary +----------+------------+---------+-----------+----------+-------+ IJV           Full       Yes       Yes                      +----------+------------+---------+-----------+----------+-------+ Subclavian    Full       Yes       Yes                      +----------+------------+---------+-----------+----------+-------+ Axillary      Full       Yes       Yes                      +----------+------------+---------+-----------+----------+-------+ Brachial      Full       Yes       Yes                      +----------+------------+---------+-----------+----------+-------+ Radial        Full                                          +----------+------------+---------+-----------+----------+-------+ Ulnar         Full                                          +----------+------------+---------+-----------+----------+-------+ Cephalic      Full                                          +----------+------------+---------+-----------+----------+-------+ Basilic       Full                                          +----------+------------+---------+-----------+----------+-------+  Summary:  Right: No evidence of thrombosis in the subclavian.  Left: No evidence of deep vein thrombosis in the upper extremity. No evidence of superficial vein thrombosis in the upper extremity. No evidence of thrombosis in the  subclavian.  *See table(s) above for measurements and observations.  Diagnosing physician: Waverly Ferrari MD Electronically signed by Waverly Ferrari MD on 02/12/2021 at 2:52:58 PM.  Final        Subjective: Patient is feeling better, no chest pain, no nausea or vomiting, continue to have left shoulder pain,   Discharge Exam: Vitals:   02/13/21 0401 02/13/21 0741  BP: (!) 158/68 (!) 151/80  Pulse: 61 (!) 59  Resp: 16   Temp: 98.7 F (37.1 C) 99.4 F (37.4 C)  SpO2: 96% 98%   Vitals:   02/12/21 2352 02/13/21 0401 02/13/21 0404 02/13/21 0741  BP: (!) 115/59 (!) 158/68  (!) 151/80  Pulse: 67 61  (!) 59  Resp: 16 16    Temp: 98.2 F (36.8 C) 98.7 F (37.1 C)  99.4 F (37.4 C)  TempSrc: Oral Oral  Oral  SpO2: 97% 96%  98%  Weight:   58.3 kg   Height:        General: Not in pain or dyspnea.  Neurology: Awake and alert, non focal  E ENT: no pallor, no icterus, oral mucosa moist Cardiovascular: No JVD. S1-S2 present, rhythmic, no gallops, rubs, or murmurs. No lower extremity edema. Pulmonary: positive breath sounds bilaterally, adequate air movement, no wheezing, rhonchi or rales. Gastrointestinal. Abdomen soft and non tender Skin. No rashes Musculoskeletal: left shoulder pain, tender to palpation and passive movement.    The results of significant diagnostics from this hospitalization (including imaging, microbiology, ancillary and laboratory) are listed below for reference.     Microbiology: No results found for this or any previous visit (from the past 240 hour(s)).   Labs: BNP (last 3 results) No results for input(s): BNP in the last 8760 hours. Basic Metabolic Panel: Recent Labs  Lab 02/12/21 1300 02/13/21 0347  NA 138 135  K 4.4 3.8  CL 104 105  CO2 24 22  GLUCOSE 106* 76  BUN 21 21  CREATININE 1.31* 1.32*  CALCIUM 9.2 8.7*   Liver Function Tests: No results for input(s): AST, ALT, ALKPHOS, BILITOT, PROT, ALBUMIN in the last 168 hours. No  results for input(s): LIPASE, AMYLASE in the last 168 hours. No results for input(s): AMMONIA in the last 168 hours. CBC: Recent Labs  Lab 02/12/21 1300  WBC 5.2  NEUTROABS 3.0  HGB 13.5  HCT 41.8  MCV 94.4  PLT 257   Cardiac Enzymes: No results for input(s): CKTOTAL, CKMB, CKMBINDEX, TROPONINI in the last 168 hours. BNP: Invalid input(s): POCBNP CBG: Recent Labs  Lab 02/12/21 1926 02/13/21 0554  GLUCAP 108* 131*   D-Dimer No results for input(s): DDIMER in the last 72 hours. Hgb A1c No results for input(s): HGBA1C in the last 72 hours. Lipid Profile Recent Labs    02/13/21 0348  CHOL 146  HDL 57  LDLCALC 75  TRIG 70  CHOLHDL 2.6   Thyroid function studies No results for input(s): TSH, T4TOTAL, T3FREE, THYROIDAB in the last 72 hours.  Invalid input(s): FREET3 Anemia work up No results for input(s): VITAMINB12, FOLATE, FERRITIN, TIBC, IRON, RETICCTPCT in the last 72 hours. Urinalysis    Component Value Date/Time   COLORURINE YELLOW 11/21/2017 1431   APPEARANCEUR CLEAR 11/21/2017 1431   LABSPEC 1.020 11/21/2017 1431   PHURINE 5.5 11/21/2017 1431   GLUCOSEU NEGATIVE 11/21/2017 1431   HGBUR NEGATIVE 11/21/2017 1431   BILIRUBINUR NEGATIVE 11/21/2017 1431   KETONESUR NEGATIVE 11/21/2017 1431   PROTEINUR NEGATIVE 11/21/2017 1431   UROBILINOGEN 0.2 02/25/2012 1022   NITRITE NEGATIVE 11/21/2017 1431   LEUKOCYTESUR NEGATIVE 11/21/2017 1431   Sepsis Labs Invalid input(s): PROCALCITONIN,  WBC,  LACTICIDVEN Microbiology No results found for  this or any previous visit (from the past 240 hour(s)).   Time coordinating discharge: 45 minutes  SIGNED:   Coralie KeensMauricio Daniel Enora Trillo, MD  [facility] 02/13/2021, 9:14 AM

## 2021-02-13 NOTE — Telephone Encounter (Signed)
Can you please contact pt tomorrow (she is in ED this AM) and confirm that she is seeing another primary care at this time?  If not, please schedule an ED follow up visit.

## 2021-02-13 NOTE — Progress Notes (Signed)
Echocardiogram 2D Echocardiogram has been performed.  Warren Lacy Flynn Lininger RDCS 02/13/2021, 8:42 AM

## 2021-02-13 NOTE — Progress Notes (Signed)
Progress Note  Patient Name: Brittany Robles Date of Encounter: 02/13/2021  [facility] Cardiologist: None   Subjective   Continues to complain of severe left shoulder pain.  The pain medication helps temporarily and then recurs within an hour.  She has no chest pain or exertional symptoms.  Inpatient Medications    Scheduled Meds:  amLODipine  5 mg Oral Daily   chlorthalidone  25 mg Oral Daily   enoxaparin (LOVENOX) injection  40 mg Subcutaneous Q24H   insulin aspart  0-9 Units Subcutaneous TID WC   latanoprost  1 drop Both Eyes QHS   metoprolol succinate  50 mg Oral Daily   pravastatin  10 mg Oral QPM   sodium chloride flush  3 mL Intravenous Q12H   Continuous Infusions:  sodium chloride     PRN Meds: sodium chloride, acetaminophen **OR** acetaminophen, cyclobenzaprine, HYDROcodone-acetaminophen, lidocaine, nitroGLYCERIN, sodium chloride flush   Vital Signs    Vitals:   02/12/21 2352 02/13/21 0401 02/13/21 0404 02/13/21 0741  BP: (!) 115/59 (!) 158/68  (!) 151/80  Pulse: 67 61  (!) 59  Resp: 16 16    Temp: 98.2 F (36.8 C) 98.7 F (37.1 C)  99.4 F (37.4 C)  TempSrc: Oral Oral  Oral  SpO2: 97% 96%  98%  Weight:   58.3 kg   Height:        Intake/Output Summary (Last 24 hours) at 02/13/2021 0844 Last data filed at 02/13/2021 0128 Gross per 24 hour  Intake 240 ml  Output 300 ml  Net -60 ml   Last 3 Weights 02/13/2021 02/12/2021 05/11/2018  Weight (lbs) 128 lb 8 oz 128 lb 8.5 oz 124 lb  Weight (kg) 58.287 kg 58.3 kg 56.246 kg      Telemetry    Sinus rhythm.  PACs.- Personally Reviewed  ECG    Sinus rhythm.  Rate 63 bpm.  PACs.  LAFB. - Personally Reviewed  Physical Exam   VS:  BP (!) 151/80 (BP Location: Left Arm)   Pulse (!) 59   Temp 99.4 F (37.4 C) (Oral)   Resp 16   Ht 4\' 11"  (1.499 m)   Wt 58.3 kg   LMP 08/18/1944   SpO2 98%   BMI 25.95 kg/m  , BMI Body mass index is 25.95 kg/m. GENERAL:  Well appearing HEENT: Pupils equal round and  reactive, fundi not visualized, oral mucosa unremarkable NECK:  No jugular venous distention, waveform within normal limits, carotid upstroke brisk and symmetric, no bruits LUNGS:  Clear to auscultation bilaterally HEART:  RRR.  PMI not displaced or sustained,S1 and S2 within normal limits, no S3, no S4, no clicks, no rubs, no murmurs ABD:  Flat, positive bowel sounds normal in frequency in pitch, no bruits, no rebound, no guarding, no midline pulsatile mass, no hepatomegaly, no splenomegaly EXT:  2 plus pulses throughout, no edema, no cyanosis no clubbing SKIN:  No rashes no nodules NEURO:  Cranial nerves II through XII grossly intact, motor grossly intact throughout [facility]:  Cognitively intact, oriented to person place and time  Labs    High Sensitivity Troponin:   Recent Labs  Lab 02/12/21 1300 02/12/21 1430  TROPONINIHS 53* 42*      Chemistry Recent Labs  Lab 02/12/21 1300 02/13/21 0347  NA 138 135  K 4.4 3.8  CL 104 105  CO2 24 22  GLUCOSE 106* 76  BUN 21 21  CREATININE 1.31* 1.32*  CALCIUM 9.2 8.7*  GFRNONAA 41* 41*  ANIONGAP 10 8     Hematology Recent Labs  Lab 02/12/21 1300  WBC 5.2  RBC 4.43  HGB 13.5  HCT 41.8  MCV 94.4  MCH 30.5  MCHC 32.3  RDW 13.3  PLT 257    BNPNo results for input(s): BNP, PROBNP in the last 168 hours.   DDimer No results for input(s): DDIMER in the last 168 hours.   Radiology    DG Chest 2 View  Result Date: 02/12/2021 CLINICAL DATA:  Back/left arm pain. EXAM: CHEST - 2 VIEW COMPARISON:  CT report 11/21/2017.  Chest x-ray 03/03/2013. FINDINGS: Mediastinum hilar structures normal. Cardiomegaly. No pulmonary venous congestion. Mild left mid lung subsegmental atelectasis and or scarring. No pleural effusion or pneumothorax. Prior cervical spine fusion. Degenerative change thoracic spine and both shoulders. IMPRESSION: 1.  Cardiomegaly.  No pulmonary venous congestion. 2. Mild left mid lung subsegmental atelectasis and or  scarring. No acute pulmonary disease. 3. Degenerative change thoracic spine. Prior cervical spine fusion. Degenerative changes both shoulders. Electronically Signed   By: Maisie Fus  Register   On: 02/12/2021 14:27   DG Humerus Left  Result Date: 02/12/2021 CLINICAL DATA:  Left arm pain no injury EXAM: LEFT HUMERUS - 2+ VIEW COMPARISON:  03/12/2017 FINDINGS: Negative for fracture or bone lesion. Rotator cuff impingement with downward spurring of the acromion. IMPRESSION: Rotator cuff impingement.  No fracture identified. Electronically Signed   By: Marlan Palau M.D.   On: 02/12/2021 14:28   UE VENOUS DUPLEX [facility] 7 am - 7 pm)  Result Date: 02/12/2021 UPPER VENOUS STUDY  Patient Name:  Brittany Robles [place]  Date of Exam:   02/12/2021 Medical Rec #: [id number]       Accession #:    [id number] Date of Birth: 08-24-1944      Patient Gender: F Patient Age:   80Y Exam Location:  [facility] Procedure:      VAS [place] UPPER EXTREMITY VENOUS DUPLEX Referring Phys: [id number] SCOTT GOLDSTON --------------------------------------------------------------------------------  Indications: Pain Comparison Study: no prior Performing Technologist: [place] Ponder RVS  Examination Guidelines: A complete evaluation includes B-mode imaging, spectral Doppler, color Doppler, and power Doppler as needed of all accessible portions of each vessel. Bilateral testing is considered an integral part of a complete examination. Limited examinations for reoccurring indications may be performed as noted.  Right Findings: +----------+------------+---------+-----------+----------+-------+ RIGHT     CompressiblePhasicitySpontaneousPropertiesSummary +----------+------------+---------+-----------+----------+-------+ Subclavian    Full       Yes       Yes                      +----------+------------+---------+-----------+----------+-------+  Left Findings: +----------+------------+---------+-----------+----------+-------+ LEFT       CompressiblePhasicitySpontaneousPropertiesSummary +----------+------------+---------+-----------+----------+-------+ IJV           Full       Yes       Yes                      +----------+------------+---------+-----------+----------+-------+ Subclavian    Full       Yes       Yes                      +----------+------------+---------+-----------+----------+-------+ Axillary      Full       Yes       Yes                      +----------+------------+---------+-----------+----------+-------+ Brachial  Full       Yes       Yes                      +----------+------------+---------+-----------+----------+-------+ Radial        Full                                          +----------+------------+---------+-----------+----------+-------+ Ulnar         Full                                          +----------+------------+---------+-----------+----------+-------+ Cephalic      Full                                          +----------+------------+---------+-----------+----------+-------+ Basilic       Full                                          +----------+------------+---------+-----------+----------+-------+  Summary:  Right: No evidence of thrombosis in the subclavian.  Left: No evidence of deep vein thrombosis in the upper extremity. No evidence of superficial vein thrombosis in the upper extremity. No evidence of thrombosis in the subclavian.  *See table(s) above for measurements and observations.  Diagnosing physician: Waverly Ferrari MD Electronically signed by Waverly Ferrari MD on 02/12/2021 at 2:52:58 PM.    Final     Cardiac Studies   Echo pending  Patient Profile     80 y.o. female with hypertension, hyperlipidemia, diabetes and carotid stenosis here with L shoulder pain and elevated troponin.    Assessment & Plan    #Left shoulder pain: Seems to be all musculoskeletal.  She has known rotator cuff issues.  She has been  getting injections.  I discussed the fact that she may want to talk with her sports medicine doctor about considering surgery.  She is hesitant to have surgery.  It does not seem to be cardiac.  It never occurs with exertion and only with movement of her shoulder.  Management per orthopedics and primary team.  #Elevated troponin: Consistent with demand ischemia in the setting of hypertensive urgency and pain.  If you echo images were reviewed in the room.  She appears to have normal systolic function.  Will await full report.  Plan for outpatient follow-up and consider stress test if she has exertional symptoms.  #Hypertensive urgency: Blood pressure has been extremely elevated.  She is unclear on her home regimen and it has been changing.  She has not yet received the chlorthalidone and amlodipine that were ordered.  We will see what her blood pressures do on this regimen.      For questions or updates, please contact [facility] Please consult [email or web address] for contact info under        Signed, Chilton Si, MD  02/13/2021, 8:44 AM

## 2021-02-13 NOTE — Plan of Care (Signed)

## 2021-02-14 LAB — HEMOGLOBIN A1C
Hgb A1c MFr Bld: 5.7 % — ABNORMAL HIGH (ref 4.8–5.6)
Mean Plasma Glucose: 117 mg/dL

## 2021-02-14 NOTE — Telephone Encounter (Signed)
Called patient and she reports she is seeing a new pcp Quitman Livings with [facility].

## 2021-02-20 ENCOUNTER — Ambulatory Visit: Payer: Medicare (Managed Care) | Admitting: Family

## 2021-03-13 ENCOUNTER — Ambulatory Visit (HOSPITAL_BASED_OUTPATIENT_CLINIC_OR_DEPARTMENT_OTHER): Payer: Medicare (Managed Care) | Admitting: Family
